# Patient Record
Sex: Female | Born: 1945 | Race: White | Hispanic: No | Smoking: Former smoker
Health system: Southern US, Community
[De-identification: ages and names within clinical notes are randomized; demographics above are authoritative.]

## PROBLEM LIST (undated history)

## (undated) DIAGNOSIS — M419 Scoliosis, unspecified: Secondary | ICD-10-CM

## (undated) DIAGNOSIS — I10 Essential (primary) hypertension: Secondary | ICD-10-CM

## (undated) DIAGNOSIS — K219 Gastro-esophageal reflux disease without esophagitis: Secondary | ICD-10-CM

## (undated) DIAGNOSIS — I471 Supraventricular tachycardia, unspecified: Secondary | ICD-10-CM

## (undated) DIAGNOSIS — C50919 Malignant neoplasm of unspecified site of unspecified female breast: Secondary | ICD-10-CM

## (undated) DIAGNOSIS — Z9889 Other specified postprocedural states: Secondary | ICD-10-CM

## (undated) DIAGNOSIS — T8859XA Other complications of anesthesia, initial encounter: Secondary | ICD-10-CM

## (undated) DIAGNOSIS — M8580 Other specified disorders of bone density and structure, unspecified site: Secondary | ICD-10-CM

## (undated) DIAGNOSIS — C541 Malignant neoplasm of endometrium: Secondary | ICD-10-CM

## (undated) DIAGNOSIS — G4730 Sleep apnea, unspecified: Secondary | ICD-10-CM

## (undated) DIAGNOSIS — E785 Hyperlipidemia, unspecified: Secondary | ICD-10-CM

## (undated) DIAGNOSIS — C439 Malignant melanoma of skin, unspecified: Secondary | ICD-10-CM

## (undated) DIAGNOSIS — T4145XA Adverse effect of unspecified anesthetic, initial encounter: Secondary | ICD-10-CM

## (undated) DIAGNOSIS — R112 Nausea with vomiting, unspecified: Secondary | ICD-10-CM

## (undated) DIAGNOSIS — C4491 Basal cell carcinoma of skin, unspecified: Secondary | ICD-10-CM

## (undated) HISTORY — DX: Malignant neoplasm of unspecified site of unspecified female breast: C50.919

## (undated) HISTORY — DX: Essential (primary) hypertension: I10

## (undated) HISTORY — DX: Malignant melanoma of skin, unspecified: C43.9

## (undated) HISTORY — DX: Gastro-esophageal reflux disease without esophagitis: K21.9

## (undated) HISTORY — PX: BASAL CELL CARCINOMA EXCISION: SHX1214

## (undated) HISTORY — DX: Supraventricular tachycardia: I47.1

## (undated) HISTORY — DX: Basal cell carcinoma of skin, unspecified: C44.91

## (undated) HISTORY — DX: Supraventricular tachycardia, unspecified: I47.10

## (undated) HISTORY — DX: Scoliosis, unspecified: M41.9

## (undated) HISTORY — DX: Hyperlipidemia, unspecified: E78.5

## (undated) HISTORY — DX: Other specified disorders of bone density and structure, unspecified site: M85.80

## (undated) HISTORY — PX: OTHER SURGICAL HISTORY: SHX169

## (undated) HISTORY — PX: SKIN CANCER EXCISION: SHX779

## (undated) HISTORY — PX: VULVA /PERINEUM BIOPSY: SHX319

## (undated) HISTORY — PX: SHOULDER SURGERY: SHX246

## (undated) HISTORY — PX: NOSE SURGERY: SHX723

---
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
## 1976-05-14 HISTORY — PX: TUBAL LIGATION: SHX77

## 1997-06-30 ENCOUNTER — Other Ambulatory Visit: Admission: RE | Admit: 1997-06-30 | Discharge: 1997-06-30 | Payer: Self-pay | Admitting: Obstetrics and Gynecology

## 1997-08-19 ENCOUNTER — Other Ambulatory Visit: Admission: RE | Admit: 1997-08-19 | Discharge: 1997-08-19 | Payer: Self-pay | Admitting: Obstetrics and Gynecology

## 1998-08-26 ENCOUNTER — Other Ambulatory Visit: Admission: RE | Admit: 1998-08-26 | Discharge: 1998-08-26 | Payer: Self-pay | Admitting: Obstetrics and Gynecology

## 1998-09-23 ENCOUNTER — Other Ambulatory Visit: Admission: RE | Admit: 1998-09-23 | Discharge: 1998-09-23 | Payer: Self-pay | Admitting: Obstetrics and Gynecology

## 1999-09-15 ENCOUNTER — Other Ambulatory Visit: Admission: RE | Admit: 1999-09-15 | Discharge: 1999-09-15 | Payer: Self-pay | Admitting: Obstetrics and Gynecology

## 2000-09-27 ENCOUNTER — Other Ambulatory Visit: Admission: RE | Admit: 2000-09-27 | Discharge: 2000-09-27 | Payer: Self-pay | Admitting: Obstetrics and Gynecology

## 2001-05-14 DIAGNOSIS — C50919 Malignant neoplasm of unspecified site of unspecified female breast: Secondary | ICD-10-CM

## 2001-05-14 HISTORY — DX: Malignant neoplasm of unspecified site of unspecified female breast: C50.919

## 2001-07-12 HISTORY — PX: BREAST LUMPECTOMY: SHX2

## 2001-07-31 ENCOUNTER — Other Ambulatory Visit: Admission: RE | Admit: 2001-07-31 | Discharge: 2001-07-31 | Payer: Self-pay | Admitting: Surgery

## 2001-08-05 ENCOUNTER — Ambulatory Visit: Admission: RE | Admit: 2001-08-05 | Discharge: 2001-11-03 | Payer: Self-pay | Admitting: Radiation Oncology

## 2001-08-05 ENCOUNTER — Encounter: Payer: Self-pay | Admitting: Surgery

## 2001-08-05 ENCOUNTER — Encounter: Admission: RE | Admit: 2001-08-05 | Discharge: 2001-08-05 | Payer: Self-pay | Admitting: Surgery

## 2001-08-08 ENCOUNTER — Ambulatory Visit (HOSPITAL_COMMUNITY): Admission: RE | Admit: 2001-08-08 | Discharge: 2001-08-08 | Payer: Self-pay | Admitting: Surgery

## 2001-08-08 ENCOUNTER — Encounter (INDEPENDENT_AMBULATORY_CARE_PROVIDER_SITE_OTHER): Payer: Self-pay | Admitting: *Deleted

## 2001-09-30 ENCOUNTER — Other Ambulatory Visit: Admission: RE | Admit: 2001-09-30 | Discharge: 2001-09-30 | Payer: Self-pay | Admitting: Obstetrics and Gynecology

## 2002-02-18 ENCOUNTER — Ambulatory Visit: Admission: RE | Admit: 2002-02-18 | Discharge: 2002-05-19 | Payer: Self-pay | Admitting: Radiation Oncology

## 2002-10-16 ENCOUNTER — Other Ambulatory Visit: Admission: RE | Admit: 2002-10-16 | Discharge: 2002-10-16 | Payer: Self-pay | Admitting: Obstetrics and Gynecology

## 2003-01-22 ENCOUNTER — Ambulatory Visit (HOSPITAL_COMMUNITY): Admission: RE | Admit: 2003-01-22 | Discharge: 2003-01-22 | Payer: Self-pay | Admitting: Gastroenterology

## 2003-05-15 HISTORY — PX: BREAST RECONSTRUCTION: SHX9

## 2003-10-18 ENCOUNTER — Other Ambulatory Visit: Admission: RE | Admit: 2003-10-18 | Discharge: 2003-10-18 | Payer: Self-pay | Admitting: Obstetrics and Gynecology

## 2004-08-01 ENCOUNTER — Ambulatory Visit: Payer: Self-pay | Admitting: Oncology

## 2004-11-03 ENCOUNTER — Other Ambulatory Visit: Admission: RE | Admit: 2004-11-03 | Discharge: 2004-11-03 | Payer: Self-pay | Admitting: Obstetrics and Gynecology

## 2005-01-29 ENCOUNTER — Ambulatory Visit: Payer: Self-pay | Admitting: Oncology

## 2005-07-30 ENCOUNTER — Ambulatory Visit: Payer: Self-pay | Admitting: Oncology

## 2005-11-09 ENCOUNTER — Other Ambulatory Visit: Admission: RE | Admit: 2005-11-09 | Discharge: 2005-11-09 | Payer: Self-pay | Admitting: Obstetrics and Gynecology

## 2005-12-12 HISTORY — PX: TRANSTHORACIC ECHOCARDIOGRAM: SHX275

## 2006-01-12 HISTORY — PX: NM MYOCAR PERF WALL MOTION: HXRAD629

## 2006-01-18 ENCOUNTER — Ambulatory Visit: Payer: Self-pay | Admitting: Oncology

## 2006-01-22 LAB — COMPREHENSIVE METABOLIC PANEL
ALT: 18 U/L (ref 0–40)
AST: 19 U/L (ref 0–37)
Albumin: 4.4 g/dL (ref 3.5–5.2)
Alkaline Phosphatase: 93 U/L (ref 39–117)
BUN: 21 mg/dL (ref 6–23)
CO2: 28 mEq/L (ref 19–32)
Calcium: 9.4 mg/dL (ref 8.4–10.5)
Chloride: 104 mEq/L (ref 96–112)
Creatinine, Ser: 0.79 mg/dL (ref 0.40–1.20)
Glucose, Bld: 85 mg/dL (ref 70–99)
Potassium: 4.4 mEq/L (ref 3.5–5.3)
Sodium: 140 mEq/L (ref 135–145)
Total Bilirubin: 0.4 mg/dL (ref 0.3–1.2)
Total Protein: 6.8 g/dL (ref 6.0–8.3)

## 2006-01-22 LAB — CBC WITH DIFFERENTIAL/PLATELET
BASO%: 0.4 % (ref 0.0–2.0)
Basophils Absolute: 0 10*3/uL (ref 0.0–0.1)
EOS%: 2.7 % (ref 0.0–7.0)
Eosinophils Absolute: 0.2 10*3/uL (ref 0.0–0.5)
HCT: 38 % (ref 34.8–46.6)
HGB: 12.9 g/dL (ref 11.6–15.9)
LYMPH%: 17.8 % (ref 14.0–48.0)
MCH: 30 pg (ref 26.0–34.0)
MCHC: 33.9 g/dL (ref 32.0–36.0)
MCV: 88.6 fL (ref 81.0–101.0)
MONO#: 0.6 10*3/uL (ref 0.1–0.9)
MONO%: 8.4 % (ref 0.0–13.0)
NEUT#: 4.8 10*3/uL (ref 1.5–6.5)
NEUT%: 70.7 % (ref 39.6–76.8)
Platelets: 239 10*3/uL (ref 145–400)
RBC: 4.29 10*6/uL (ref 3.70–5.32)
RDW: 13.2 % (ref 11.3–14.5)
WBC: 6.9 10*3/uL (ref 3.9–10.0)
lymph#: 1.2 10*3/uL (ref 0.9–3.3)

## 2006-08-12 ENCOUNTER — Ambulatory Visit: Payer: Self-pay | Admitting: Oncology

## 2006-08-14 LAB — CBC WITH DIFFERENTIAL/PLATELET
BASO%: 1.5 % (ref 0.0–2.0)
Basophils Absolute: 0.1 10*3/uL (ref 0.0–0.1)
EOS%: 2.5 % (ref 0.0–7.0)
Eosinophils Absolute: 0.2 10*3/uL (ref 0.0–0.5)
HCT: 37.5 % (ref 34.8–46.6)
HGB: 13.1 g/dL (ref 11.6–15.9)
LYMPH%: 18.3 % (ref 14.0–48.0)
MCH: 31 pg (ref 26.0–34.0)
MCHC: 35 g/dL (ref 32.0–36.0)
MCV: 88.7 fL (ref 81.0–101.0)
MONO#: 0.6 10*3/uL (ref 0.1–0.9)
MONO%: 7.2 % (ref 0.0–13.0)
NEUT#: 5.4 10*3/uL (ref 1.5–6.5)
NEUT%: 70.4 % (ref 39.6–76.8)
Platelets: 226 10*3/uL (ref 145–400)
RBC: 4.23 10*6/uL (ref 3.70–5.32)
RDW: 11.9 % (ref 11.3–14.5)
WBC: 7.7 10*3/uL (ref 3.9–10.0)
lymph#: 1.4 10*3/uL (ref 0.9–3.3)

## 2006-08-14 LAB — COMPREHENSIVE METABOLIC PANEL
ALT: 16 U/L (ref 0–35)
AST: 20 U/L (ref 0–37)
Albumin: 4.5 g/dL (ref 3.5–5.2)
Alkaline Phosphatase: 81 U/L (ref 39–117)
BUN: 18 mg/dL (ref 6–23)
CO2: 26 mEq/L (ref 19–32)
Calcium: 9.4 mg/dL (ref 8.4–10.5)
Chloride: 105 mEq/L (ref 96–112)
Creatinine, Ser: 0.73 mg/dL (ref 0.40–1.20)
Glucose, Bld: 89 mg/dL (ref 70–99)
Potassium: 4.2 mEq/L (ref 3.5–5.3)
Sodium: 140 mEq/L (ref 135–145)
Total Bilirubin: 0.5 mg/dL (ref 0.3–1.2)
Total Protein: 6.8 g/dL (ref 6.0–8.3)

## 2006-11-22 ENCOUNTER — Other Ambulatory Visit: Admission: RE | Admit: 2006-11-22 | Discharge: 2006-11-22 | Payer: Self-pay | Admitting: Obstetrics and Gynecology

## 2007-02-14 ENCOUNTER — Ambulatory Visit: Payer: Self-pay | Admitting: Oncology

## 2007-02-18 LAB — COMPREHENSIVE METABOLIC PANEL
ALT: 28 U/L (ref 0–35)
AST: 25 U/L (ref 0–37)
Albumin: 4.3 g/dL (ref 3.5–5.2)
Alkaline Phosphatase: 77 U/L (ref 39–117)
BUN: 19 mg/dL (ref 6–23)
CO2: 27 mEq/L (ref 19–32)
Calcium: 9.7 mg/dL (ref 8.4–10.5)
Chloride: 104 mEq/L (ref 96–112)
Creatinine, Ser: 0.85 mg/dL (ref 0.40–1.20)
Glucose, Bld: 75 mg/dL (ref 70–99)
Potassium: 4.4 mEq/L (ref 3.5–5.3)
Sodium: 140 mEq/L (ref 135–145)
Total Bilirubin: 0.6 mg/dL (ref 0.3–1.2)
Total Protein: 6.5 g/dL (ref 6.0–8.3)

## 2007-02-18 LAB — CBC WITH DIFFERENTIAL/PLATELET
BASO%: 0.4 % (ref 0.0–2.0)
Basophils Absolute: 0 10*3/uL (ref 0.0–0.1)
EOS%: 2.7 % (ref 0.0–7.0)
Eosinophils Absolute: 0.2 10*3/uL (ref 0.0–0.5)
HCT: 38 % (ref 34.8–46.6)
HGB: 13.3 g/dL (ref 11.6–15.9)
LYMPH%: 17.4 % (ref 14.0–48.0)
MCH: 30.8 pg (ref 26.0–34.0)
MCHC: 34.8 g/dL (ref 32.0–36.0)
MCV: 88.4 fL (ref 81.0–101.0)
MONO#: 0.6 10*3/uL (ref 0.1–0.9)
MONO%: 10.2 % (ref 0.0–13.0)
NEUT#: 4.2 10*3/uL (ref 1.5–6.5)
NEUT%: 69.3 % (ref 39.6–76.8)
Platelets: 214 10*3/uL (ref 145–400)
RBC: 4.3 10*6/uL (ref 3.70–5.32)
RDW: 13.1 % (ref 11.3–14.5)
WBC: 6 10*3/uL (ref 3.9–10.0)
lymph#: 1 10*3/uL (ref 0.9–3.3)

## 2007-08-20 ENCOUNTER — Ambulatory Visit: Payer: Self-pay | Admitting: Oncology

## 2007-08-22 LAB — CBC WITH DIFFERENTIAL/PLATELET
BASO%: 0.9 % (ref 0.0–2.0)
Basophils Absolute: 0.1 10*3/uL (ref 0.0–0.1)
EOS%: 3 % (ref 0.0–7.0)
Eosinophils Absolute: 0.3 10*3/uL (ref 0.0–0.5)
HCT: 37.5 % (ref 34.8–46.6)
HGB: 12.9 g/dL (ref 11.6–15.9)
LYMPH%: 16 % (ref 14.0–48.0)
MCH: 30.5 pg (ref 26.0–34.0)
MCHC: 34.5 g/dL (ref 32.0–36.0)
MCV: 88.4 fL (ref 81.0–101.0)
MONO#: 0.8 10*3/uL (ref 0.1–0.9)
MONO%: 8.9 % (ref 0.0–13.0)
NEUT#: 6.4 10*3/uL (ref 1.5–6.5)
NEUT%: 71.2 % (ref 39.6–76.8)
Platelets: 240 10*3/uL (ref 145–400)
RBC: 4.24 10*6/uL (ref 3.70–5.32)
RDW: 13.1 % (ref 11.3–14.5)
WBC: 8.9 10*3/uL (ref 3.9–10.0)
lymph#: 1.4 10*3/uL (ref 0.9–3.3)

## 2007-08-22 LAB — COMPREHENSIVE METABOLIC PANEL
ALT: 20 U/L (ref 0–35)
AST: 22 U/L (ref 0–37)
Albumin: 4.4 g/dL (ref 3.5–5.2)
Alkaline Phosphatase: 112 U/L (ref 39–117)
BUN: 20 mg/dL (ref 6–23)
CO2: 25 mEq/L (ref 19–32)
Calcium: 9.3 mg/dL (ref 8.4–10.5)
Chloride: 105 mEq/L (ref 96–112)
Creatinine, Ser: 0.78 mg/dL (ref 0.40–1.20)
Glucose, Bld: 92 mg/dL (ref 70–99)
Potassium: 4 mEq/L (ref 3.5–5.3)
Sodium: 141 mEq/L (ref 135–145)
Total Bilirubin: 0.4 mg/dL (ref 0.3–1.2)
Total Protein: 7.1 g/dL (ref 6.0–8.3)

## 2007-12-05 ENCOUNTER — Other Ambulatory Visit: Admission: RE | Admit: 2007-12-05 | Discharge: 2007-12-05 | Payer: Self-pay | Admitting: Obstetrics and Gynecology

## 2008-02-18 ENCOUNTER — Ambulatory Visit: Payer: Self-pay | Admitting: Oncology

## 2008-02-20 LAB — CBC WITH DIFFERENTIAL/PLATELET
BASO%: 1.3 % (ref 0.0–2.0)
Basophils Absolute: 0.1 10*3/uL (ref 0.0–0.1)
EOS%: 3.4 % (ref 0.0–7.0)
Eosinophils Absolute: 0.3 10*3/uL (ref 0.0–0.5)
HCT: 37.2 % (ref 34.8–46.6)
HGB: 12.8 g/dL (ref 11.6–15.9)
LYMPH%: 15.9 % (ref 14.0–48.0)
MCH: 30.4 pg (ref 26.0–34.0)
MCHC: 34.5 g/dL (ref 32.0–36.0)
MCV: 88.1 fL (ref 81.0–101.0)
MONO#: 0.7 10*3/uL (ref 0.1–0.9)
MONO%: 9.9 % (ref 0.0–13.0)
NEUT#: 5.2 10*3/uL (ref 1.5–6.5)
NEUT%: 69.6 % (ref 39.6–76.8)
Platelets: 221 10*3/uL (ref 145–400)
RBC: 4.22 10*6/uL (ref 3.70–5.32)
RDW: 11.8 % (ref 11.3–14.5)
WBC: 7.5 10*3/uL (ref 3.9–10.0)
lymph#: 1.2 10*3/uL (ref 0.9–3.3)

## 2008-06-01 ENCOUNTER — Ambulatory Visit (HOSPITAL_COMMUNITY): Admission: RE | Admit: 2008-06-01 | Discharge: 2008-06-01 | Payer: Self-pay | Admitting: Oncology

## 2008-06-01 ENCOUNTER — Ambulatory Visit: Payer: Self-pay | Admitting: Oncology

## 2008-11-19 ENCOUNTER — Ambulatory Visit: Payer: Self-pay | Admitting: Oncology

## 2008-11-23 LAB — COMPREHENSIVE METABOLIC PANEL
ALT: 22 U/L (ref 0–35)
AST: 25 U/L (ref 0–37)
Albumin: 4.3 g/dL (ref 3.5–5.2)
Alkaline Phosphatase: 82 U/L (ref 39–117)
BUN: 17 mg/dL (ref 6–23)
CO2: 20 mEq/L (ref 19–32)
Calcium: 9.4 mg/dL (ref 8.4–10.5)
Chloride: 105 mEq/L (ref 96–112)
Creatinine, Ser: 0.85 mg/dL (ref 0.40–1.20)
Glucose, Bld: 94 mg/dL (ref 70–99)
Potassium: 4 mEq/L (ref 3.5–5.3)
Sodium: 142 mEq/L (ref 135–145)
Total Bilirubin: 0.6 mg/dL (ref 0.3–1.2)
Total Protein: 6.6 g/dL (ref 6.0–8.3)

## 2008-11-23 LAB — CBC WITH DIFFERENTIAL/PLATELET
BASO%: 0.4 % (ref 0.0–2.0)
Basophils Absolute: 0 10*3/uL (ref 0.0–0.1)
EOS%: 2 % (ref 0.0–7.0)
Eosinophils Absolute: 0.1 10*3/uL (ref 0.0–0.5)
HCT: 38.7 % (ref 34.8–46.6)
HGB: 13.2 g/dL (ref 11.6–15.9)
LYMPH%: 13.2 % — ABNORMAL LOW (ref 14.0–49.7)
MCH: 30.9 pg (ref 25.1–34.0)
MCHC: 34.2 g/dL (ref 31.5–36.0)
MCV: 90.2 fL (ref 79.5–101.0)
MONO#: 0.5 10*3/uL (ref 0.1–0.9)
MONO%: 7.3 % (ref 0.0–14.0)
NEUT#: 5.4 10*3/uL (ref 1.5–6.5)
NEUT%: 77.1 % — ABNORMAL HIGH (ref 38.4–76.8)
Platelets: 198 10*3/uL (ref 145–400)
RBC: 4.29 10*6/uL (ref 3.70–5.45)
RDW: 12.8 % (ref 11.2–14.5)
WBC: 7.1 10*3/uL (ref 3.9–10.3)
lymph#: 0.9 10*3/uL (ref 0.9–3.3)

## 2008-12-07 ENCOUNTER — Encounter: Payer: Self-pay | Admitting: Obstetrics and Gynecology

## 2008-12-07 ENCOUNTER — Other Ambulatory Visit: Admission: RE | Admit: 2008-12-07 | Discharge: 2008-12-07 | Payer: Self-pay | Admitting: Obstetrics and Gynecology

## 2008-12-07 ENCOUNTER — Ambulatory Visit: Payer: Self-pay | Admitting: Obstetrics and Gynecology

## 2009-02-14 ENCOUNTER — Ambulatory Visit: Payer: Self-pay | Admitting: Obstetrics and Gynecology

## 2009-05-20 ENCOUNTER — Ambulatory Visit: Payer: Self-pay | Admitting: Oncology

## 2009-05-24 LAB — CBC WITH DIFFERENTIAL/PLATELET
BASO%: 0.4 % (ref 0.0–2.0)
Basophils Absolute: 0 10*3/uL (ref 0.0–0.1)
EOS%: 2.5 % (ref 0.0–7.0)
Eosinophils Absolute: 0.2 10*3/uL (ref 0.0–0.5)
HCT: 39.6 % (ref 34.8–46.6)
HGB: 13.4 g/dL (ref 11.6–15.9)
LYMPH%: 16.1 % (ref 14.0–49.7)
MCH: 31.3 pg (ref 25.1–34.0)
MCHC: 33.8 g/dL (ref 31.5–36.0)
MCV: 92.6 fL (ref 79.5–101.0)
MONO#: 0.7 10*3/uL (ref 0.1–0.9)
MONO%: 10.2 % (ref 0.0–14.0)
NEUT#: 4.7 10*3/uL (ref 1.5–6.5)
NEUT%: 70.8 % (ref 38.4–76.8)
Platelets: 195 10*3/uL (ref 145–400)
RBC: 4.27 10*6/uL (ref 3.70–5.45)
RDW: 13.3 % (ref 11.2–14.5)
WBC: 6.6 10*3/uL (ref 3.9–10.3)
lymph#: 1.1 10*3/uL (ref 0.9–3.3)

## 2009-09-06 ENCOUNTER — Encounter: Admission: RE | Admit: 2009-09-06 | Discharge: 2009-10-20 | Payer: Self-pay | Admitting: Orthopedic Surgery

## 2009-11-11 LAB — CONVERTED CEMR LAB: Pap Smear: NORMAL

## 2009-11-24 ENCOUNTER — Encounter: Payer: Self-pay | Admitting: Internal Medicine

## 2009-12-13 ENCOUNTER — Other Ambulatory Visit: Admission: RE | Admit: 2009-12-13 | Discharge: 2009-12-13 | Payer: Self-pay | Admitting: Obstetrics and Gynecology

## 2009-12-13 ENCOUNTER — Ambulatory Visit: Payer: Self-pay | Admitting: Obstetrics and Gynecology

## 2009-12-23 ENCOUNTER — Ambulatory Visit: Payer: Self-pay | Admitting: Obstetrics and Gynecology

## 2010-03-14 ENCOUNTER — Ambulatory Visit: Payer: Self-pay | Admitting: Internal Medicine

## 2010-03-14 DIAGNOSIS — M949 Disorder of cartilage, unspecified: Secondary | ICD-10-CM

## 2010-03-14 DIAGNOSIS — K219 Gastro-esophageal reflux disease without esophagitis: Secondary | ICD-10-CM

## 2010-03-14 DIAGNOSIS — M899 Disorder of bone, unspecified: Secondary | ICD-10-CM

## 2010-03-14 DIAGNOSIS — E785 Hyperlipidemia, unspecified: Secondary | ICD-10-CM | POA: Insufficient documentation

## 2010-03-14 DIAGNOSIS — I10 Essential (primary) hypertension: Secondary | ICD-10-CM

## 2010-03-14 DIAGNOSIS — M8580 Other specified disorders of bone density and structure, unspecified site: Secondary | ICD-10-CM | POA: Insufficient documentation

## 2010-03-14 HISTORY — DX: Hyperlipidemia, unspecified: E78.5

## 2010-03-14 HISTORY — DX: Disorder of bone, unspecified: M89.9

## 2010-03-14 HISTORY — DX: Essential (primary) hypertension: I10

## 2010-03-14 HISTORY — DX: Gastro-esophageal reflux disease without esophagitis: K21.9

## 2010-04-25 ENCOUNTER — Encounter: Payer: Self-pay | Admitting: Internal Medicine

## 2010-05-14 DIAGNOSIS — C439 Malignant melanoma of skin, unspecified: Secondary | ICD-10-CM

## 2010-05-14 HISTORY — DX: Malignant melanoma of skin, unspecified: C43.9

## 2010-05-22 ENCOUNTER — Ambulatory Visit: Payer: Self-pay | Admitting: Oncology

## 2010-05-24 ENCOUNTER — Encounter: Payer: Self-pay | Admitting: Internal Medicine

## 2010-05-24 LAB — CBC WITH DIFFERENTIAL/PLATELET
BASO%: 0.4 % (ref 0.0–2.0)
Basophils Absolute: 0 10*3/uL (ref 0.0–0.1)
EOS%: 2.1 % (ref 0.0–7.0)
Eosinophils Absolute: 0.1 10*3/uL (ref 0.0–0.5)
HCT: 39.8 % (ref 34.8–46.6)
HGB: 13.5 g/dL (ref 11.6–15.9)
LYMPH%: 14.2 % (ref 14.0–49.7)
MCH: 30.8 pg (ref 25.1–34.0)
MCHC: 33.9 g/dL (ref 31.5–36.0)
MCV: 90.9 fL (ref 79.5–101.0)
MONO#: 0.6 10*3/uL (ref 0.1–0.9)
MONO%: 9 % (ref 0.0–14.0)
NEUT#: 5.3 10*3/uL (ref 1.5–6.5)
NEUT%: 74.3 % (ref 38.4–76.8)
Platelets: 215 10*3/uL (ref 145–400)
RBC: 4.37 10*6/uL (ref 3.70–5.45)
RDW: 13.5 % (ref 11.2–14.5)
WBC: 7.1 10*3/uL (ref 3.9–10.3)
lymph#: 1 10*3/uL (ref 0.9–3.3)

## 2010-05-24 LAB — LIPID PANEL
Cholesterol: 176 mg/dL (ref 0–200)
HDL: 75 mg/dL (ref >39)
LDL Cholesterol: 85 mg/dL (ref 0–99)
Total CHOL/HDL Ratio: 2.3 Ratio
Triglycerides: 78 mg/dL (ref <150)
VLDL: 16 mg/dL (ref 0–40)

## 2010-05-24 LAB — COMPREHENSIVE METABOLIC PANEL
ALT: 18 U/L (ref 0–35)
AST: 25 U/L (ref 0–37)
Albumin: 4.5 g/dL (ref 3.5–5.2)
Alkaline Phosphatase: 86 U/L (ref 39–117)
BUN: 18 mg/dL (ref 6–23)
CO2: 29 mEq/L (ref 19–32)
Calcium: 10.1 mg/dL (ref 8.4–10.5)
Chloride: 102 mEq/L (ref 96–112)
Creatinine, Ser: 0.94 mg/dL (ref 0.40–1.20)
Glucose, Bld: 97 mg/dL (ref 70–99)
Potassium: 4.2 mEq/L (ref 3.5–5.3)
Sodium: 141 mEq/L (ref 135–145)
Total Bilirubin: 0.5 mg/dL (ref 0.3–1.2)
Total Protein: 6.9 g/dL (ref 6.0–8.3)

## 2010-06-07 ENCOUNTER — Telehealth (INDEPENDENT_AMBULATORY_CARE_PROVIDER_SITE_OTHER): Payer: Self-pay | Admitting: *Deleted

## 2010-06-13 NOTE — Assessment & Plan Note (Signed)
Summary: new to estav/cbs   Vital Signs:  Patient profile:   65 year old female Height:      60 inches Weight:      140 pounds BMI:     27.44 Pulse rate:   69 / minute Pulse rhythm:   regular BP sitting:   142 / 82  (left arm) Cuff size:   regular  Vitals Entered By: Army Fossa CMA (March 14, 2010 2:36 PM) CC: New to Establish Comments Discuss Shingles Vaccine Will check records for TD Due for Bone Density Not sure when colonoscopy was done   History of Present Illness: new patient   hypertension-- good medication compliance hyperlipidemia--  issue is followup by cardiology, they are about to change from simvastatin to Pravachol  As far as her primary care, she had a Cscope < 10 years ago w/  Dr Loreta Ave, sees gyn  DEXA--  osteopenis R hip, last dexa  ~ 1 year ago ( used [place])  she brought several labs from 11/2009, they are reviewed -- normal CBC BMP LFTs  Preventive Screening-Counseling & Management  Alcohol-Tobacco     Alcohol drinks/day: 4     Alcohol type: wine/beer     Smoking Status: never  Caffeine-Diet-Exercise     Does Patient Exercise: yes     Times/week: 5      Drug Use:  no.    Current Medications (verified): 1)  Diltiazem Hcl Cr 240 Mg Xr24h-Cap (Diltiazem Hcl) .... Once Daily 2)  Simvastatin 40 Mg Tabs (Simvastatin) .... Qd 3)  Protonix 40 Mg Solr (Pantoprazole Sodium) .... Qd 4)  B12 .... 2 Two Times A Day 5)  Vitamin D3 1000 Unit Caps (Cholecalciferol) .... 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)  Risk Factors: Alcohol Use: 4 (03/14/2010) Exercise: yes  (03/14/2010)  Risk Factors: Smoking Status: never (03/14/2010)  Past Medical History: Breast Cancer (2003), sees oncology once a year, used femara before  SVT, 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 , no caht  Skin Cancer( Basal Cell) , sees derm  Hyperlipidemia Hypertension see sgyn Dr Rulon Sera   Past Surgical History: Lumpectomy (L) Breast  Family History: Diabetes-- MGM, B (borderline) Hypertension-- M  stroke --F age 72 MI--no colon ca-- maternal aunt  breast ca-- no  Social History: widow 1 adopted and 1 natural child Never Smoked Alcohol use-yes Drug use-no Regular exercise-yes occupation-- retired, 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 Currently asymptomatic. CV:  ambulatory BPs within normal. GI:  GERD symptoms well controlled. MS:  on statins, denies myalgias.  Physical Exam  General:  alert, well-developed, and well-nourished.   Neck:  no masses, no thyromegaly, and normal carotid upstroke.   Lungs:  normal respiratory effort, no intercostal retractions, no accessory muscle use, and normal breath sounds.   Heart:  normal rate, regular rhythm, no murmur, and no gallop.   Abdomen:  soft, non-tender, no distention, no masses, no guarding, and no rigidity.   Extremities:  no lower  extremity edema Psych:  Cognition and judgment appear intact. Alert and cooperative with normal attention span and concentration. not anxious appearing and not depressed appearing.     Impression & Recommendations:  Problem # 1:  HYPERTENSION (ICD-401.9) Well-controlled BMP normal 7- 2011 Her updated medication list for this problem includes:    Diltiazem Hcl Cr 240 Mg Xr24h-cap (Diltiazem hcl) ..... Once daily  Problem # 2:  HYPERLIPIDEMIA (ICD-272.4) followup by cardiology LFTs normal  7-11 Cardiology is about to change from simvastatin to Pravachol Her updated medication list for this problem includes:    Simvastatin 40 Mg Tabs (Simvastatin) ..... Qd  Problem # 3:  ROUTINE GENERAL MEDICAL EXAM@HEALTH  CARE FACL (ICD-V70.0) she had a Cscope < 10 years ago w/  Dr Loreta Ave sees gyn  she brought several labs from 11/2009, they are reviewed -- normal CBC BMP LFTs  likes a shingles shot: provided   Problem # 4:  OSTEOPENIA (ICD-733.90) DEXA--  osteopenia  R hip, last dexa  ~ 1 year ago follow up elsewhere Her updated medication list for this problem includes:    Vitamin D3 1000 Unit Caps (Cholecalciferol) ..... Qd  Problem # 5:  GERD (ICD-530.81)  GERD well controlled with Protonix, will call when she needs a prescription  Her updated medication list for this problem includes:    Protonix 40 Mg Solr (Pantoprazole sodium) ..... Qd  Complete Medication List: 1)  Diltiazem Hcl Cr 240 Mg Xr24h-cap (Diltiazem hcl) .... Once daily 2)  Simvastatin 40 Mg Tabs (Simvastatin) .... Qd 3)  Protonix 40 Mg Solr (Pantoprazole sodium) .... Qd 4)  B12  .... 2 two times a day 5)  Vitamin D3 1000 Unit Caps (Cholecalciferol) .... Qd  Other Orders: Zoster (Shingles) Vaccine Live [id number]) Admin 1st Vaccine (98119)  Patient Instructions: 1)  Please schedule a follow-up appointment in 1 year.    Orders Added: 1)  Zoster (Shingles) Vaccine Live [90736] 2)  Admin 1st Vaccine [90471] 3)  New Patient Level III [99203]   Immunization History:  Influenza Immunization History:    Influenza:  historical (02/21/2010)  Pneumovax Immunization History:    Pneumovax:  historical (02/11/2009)  Immunizations Administered:  Zostavax # 1:    Vaccine Type: Zostavax    Site: left deltoid    Mfr: Merck    Dose: 0.65 ml    Route: [place]    Given by: Army Fossa CMA    Exp. Date: 12/30/2010    Lot #: [id number]   Immunization History:  Influenza Immunization History:    Influenza:   Historical (02/21/2010)  Pneumovax Immunization History:    Pneumovax:  Historical (02/11/2009)  Immunizations Administered:  Zostavax # 1:    Vaccine Type: Zostavax    Site: left deltoid    Mfr: Merck    Dose: 0.65 ml    Route: [place]    Given by: Army Fossa CMA    Exp. Date: 12/30/2010    Lot #: [id number]   Preventive Care Screening  Mammogram:    Date:  11/11/2009    Results:  normal- per pt   Pap Smear:    Date:  11/11/2009    Results:  normal-per pt     Risk Factors:  Tobacco use:  never Drug use:  no Alcohol use:  yes    Type:  wine/beer    Drinks per day:  4 Exercise:  yes    Times per week:  5  Mammogram History:     Date of Last Mammogram:  11/11/2009    Results:  normal- per pt   PAP Smear History:     Date of Last PAP Smear:  11/11/2009    Results:  normal-per pt

## 2010-06-15 NOTE — Letter (Signed)
Summary: [facility]  [facility]   Imported By: Lanelle Bal 05/11/2010 12:22:18  _____________________________________________________________________  External Attachment:    Type:   Image     Comment:   External Document

## 2010-06-15 NOTE — Progress Notes (Signed)
Summary: RX--  Phone Note Refill Request Call back at Home Phone [phone] Message from:  Patient  Refills Requested: Medication #1:  PROTONIX 40 MG SOLR qd   Dosage confirmed as above?Dosage Confirmed   Supply Requested: 1 month SET TO [facility] FOR 3 MONTH SUPPLY  Initial call taken by: Freddy Jaksch,  June 07, 2010 1:45 PM    Prescriptions: PROTONIX 40 MG SOLR (PANTOPRAZOLE SODIUM) qd  #90 x 2   Entered by:   Army Fossa CMA   Authorized by:   Nolon Rod. Paz MD   Signed by:   Army Fossa CMA on 06/07/2010   Method used:   Faxed to ...       [facility] (mail-order)             , [place]         Ph: [phone]       Fax: [phone]   RxID:   [id number]

## 2010-07-05 NOTE — Letter (Signed)
Summary: breast ca Dx 2003, stable, 1 year[facility]   [facility]   Imported By: Maryln Gottron 06/23/2010 14:51:41  _____________________________________________________________________  External Attachment:    Type:   Image     Comment:   External Document

## 2010-07-24 ENCOUNTER — Ambulatory Visit: Payer: Medicare Other | Attending: Orthopedic Surgery | Admitting: Physical Therapy

## 2010-07-24 DIAGNOSIS — M2569 Stiffness of other specified joint, not elsewhere classified: Secondary | ICD-10-CM | POA: Insufficient documentation

## 2010-07-24 DIAGNOSIS — M545 Low back pain, unspecified: Secondary | ICD-10-CM | POA: Insufficient documentation

## 2010-07-24 DIAGNOSIS — IMO0001 Reserved for inherently not codable concepts without codable children: Secondary | ICD-10-CM | POA: Insufficient documentation

## 2010-08-07 ENCOUNTER — Ambulatory Visit: Payer: Medicare Other | Admitting: Physical Therapy

## 2010-08-07 ENCOUNTER — Ambulatory Visit: Payer: Medicare Other | Attending: Orthopedic Surgery | Admitting: Physical Therapy

## 2010-08-07 DIAGNOSIS — M2569 Stiffness of other specified joint, not elsewhere classified: Secondary | ICD-10-CM | POA: Insufficient documentation

## 2010-08-07 DIAGNOSIS — IMO0001 Reserved for inherently not codable concepts without codable children: Secondary | ICD-10-CM | POA: Insufficient documentation

## 2010-08-07 DIAGNOSIS — M545 Low back pain, unspecified: Secondary | ICD-10-CM | POA: Insufficient documentation

## 2010-09-29 NOTE — Op Note (Signed)
NAME:  Emily Thompson, Emily Thompson                     ACCOUNT NO.:  [id number]   MEDICAL RECORD NO.:  [id number]                   PATIENT TYPE:  AMB   LOCATION:  ENDO                                 FACILITY:  [facility]   PHYSICIAN:  Anselmo Rod, M.D.               DATE OF BIRTH:  October 17, 1945   DATE OF PROCEDURE:  01/22/2003  DATE OF DISCHARGE:                                 OPERATIVE REPORT   PROCEDURE:  Screening colonoscopy.   ENDOSCOPIST:  Anselmo Rod, M.D.   INSTRUMENT USED:  Olympus video colonoscope.   INDICATIONS FOR PROCEDURE:  Rectal bleeding in a 65 year old white  female  with a personal history of breast cancer in 2003 and a family history of  colon cancer in a maternal aunt, rule out colonic polyps, masses,  hemorrhoids, etc.   PREPROCEDURE PREPARATION:  Informed consent was obtained from the patient  and the patient was  fasted for 8 hours prior to  the procedure and prepped  with a bottle of magnesium citrate and a gallon of GoLYTELY the  night prior  to the procedure.   PREPROCEDURE PHYSICAL:  The patient had stable vital signs. Neck supple.  Chest clear to auscultation, S1, S2 regular. Abdomen soft with normoactive  bowel sounds.   DESCRIPTION OF PROCEDURE:  The patient was placed in the left lateral  decubitus position and sedated with 60 mg of Demerol and 6 mg of Versed  intravenously. Once sedation was adequate, the patient was maintained on low  flow oxygen and continuous cardiac monitoring.   The Olympus video colonoscope was advanced into the rectum to the cecum and  the terminal ileum without difficulty.  The appendiceal orifice and  ileocecal valve were clearly visualized and photographed.  The terminal  ileum appeared  normal. No masses, polyps, erosions, ulcerations or  diverticula were seen.  Retroflexion in the rectum revealed a prominent  internal hemorrhoid. The patient tolerated the procedure well without  complications.   IMPRESSION:   Normal colonoscopy to the terminal ileum except for prominent  internal hemorrhoids.   RECOMMENDATIONS:  1. A high fiber diet with liberal fluid intake has been advocated.  2. Anusol HC 2.5% suppository if rectal bleeding becomes a problem in the     future.  3. Repeat colorectal cancer screening in the next 5 years considering the     patient's family history of colon cancer and personal history of breast     cancer.  4. Outpatient follow up as the need arises in the future.                                                 Anselmo Rod, M.D.    JNM/MEDQ  D:  01/22/2003  T:  01/23/2003  Job:  [id number]  cc:   Talmadge Coventry, M.D.  [address]  [place]  [place] [address]  Fax: [phone]   Lennis P. Darrold Span, M.D.  [address] [facility]  [place]  [place] [address]  Fax: [phone]   Sandria Bales. Ezzard Standing, M.D.  [address]  [place]  [place] [address]  Fax: [phone]

## 2010-10-11 ENCOUNTER — Encounter (INDEPENDENT_AMBULATORY_CARE_PROVIDER_SITE_OTHER): Payer: Self-pay | Admitting: Surgery

## 2010-12-19 ENCOUNTER — Encounter: Payer: Medicare Other | Admitting: Obstetrics and Gynecology

## 2010-12-25 ENCOUNTER — Ambulatory Visit (INDEPENDENT_AMBULATORY_CARE_PROVIDER_SITE_OTHER): Payer: Medicare Other | Admitting: Obstetrics and Gynecology

## 2010-12-25 ENCOUNTER — Encounter: Payer: Self-pay | Admitting: Obstetrics and Gynecology

## 2010-12-25 VITALS — BP 124/70 | Ht 60.0 in | Wt 140.0 lb

## 2010-12-25 DIAGNOSIS — Z780 Asymptomatic menopausal state: Secondary | ICD-10-CM

## 2010-12-25 DIAGNOSIS — Z01419 Encounter for gynecological examination (general) (routine) without abnormal findings: Secondary | ICD-10-CM

## 2010-12-25 DIAGNOSIS — R82998 Other abnormal findings in urine: Secondary | ICD-10-CM

## 2010-12-25 DIAGNOSIS — N952 Postmenopausal atrophic vaginitis: Secondary | ICD-10-CM | POA: Insufficient documentation

## 2010-12-25 DIAGNOSIS — N951 Menopausal and female climacteric states: Secondary | ICD-10-CM

## 2010-12-25 DIAGNOSIS — C50919 Malignant neoplasm of unspecified site of unspecified female breast: Secondary | ICD-10-CM | POA: Insufficient documentation

## 2010-12-25 HISTORY — DX: Postmenopausal atrophic vaginitis: N95.2

## 2010-12-25 HISTORY — DX: Asymptomatic menopausal state: Z78.0

## 2010-12-25 NOTE — Progress Notes (Signed)
Addended byCammie Mcgee T on: 12/25/2010 03:08 PM   Modules accepted: Orders

## 2010-12-25 NOTE — Progress Notes (Signed)
The patient came to see me today for a problem visit. She had been on statins for elevated cholesterol. When she was on them she was having severe hot flashes. She felt it was due to the Femara she was on for her breast cancer. When she stopped the Femara the hot flashes did not get better. She then stopped the statins and the hot flashes went way. She is trying to manage it now with Dr. Caprice Kluver with fish oils so she does not have to take statins. She is up-to-date on her mammograms. She continues to suffer with vaginal dryness. She is using lubricant because Dr. Carlis Stable did not want to take estrogen. We continued also watch her  with a red lesion on her right labia. She had a previous vulvar biopsy with [place] on the left side that revealed just a papilloma. She just had a followup bone density which showed stable osteopenia. Her FRAX risk was not elevated. She is having no vaginal bleeding. She is having no pelvic pain.  Past medical history, family history, and social history reviewed and in chart.  Review of systems reviewed and 9 point only positive as noted above.  Physical examination: HEENT within normal limits. Neck: Thyroid not large. No masses. Supraclavicular nodes: not enlarged. Breasts: Examined in both sitting midline position. No skin changes and no masses. She is status post left lumpectomy with left breast implant. She is status post reduction mammoplasty on the right. There are no masses. Abdomen: Soft no guarding rebound or masses or hernia. Pelvic: External: Within normal limits. BUS: Within normal limits. There is still a very small red lesion on her right labia. Vaginal:within normal limits. Poor estrogen effect. No evidence of cystocele rectocele or enterocele. Cervix: clean. Uterus: Normal size and shape. Adnexa: No masses.   Rectovaginal exam: Confirmatory and negative.  Assessment: 1. Menopausal symptoms from statin. 2. Atrophic vaginitis-symptomatic 3. Breast cancer 4.  Osteopenia 5. Stable labial exam  Plan: Patient to discuss with Dr. Carlis Stable whether she will now let her use Vagifem as she is 9 years post treatment for breast cancer. Continue periodic bone densities. Continue yearly mammograms. Extremities: Within normal limits.

## 2010-12-29 ENCOUNTER — Ambulatory Visit (INDEPENDENT_AMBULATORY_CARE_PROVIDER_SITE_OTHER): Payer: Medicare Other | Admitting: Surgery

## 2010-12-29 ENCOUNTER — Encounter (INDEPENDENT_AMBULATORY_CARE_PROVIDER_SITE_OTHER): Payer: Self-pay | Admitting: Surgery

## 2010-12-29 VITALS — Wt 140.0 lb

## 2010-12-29 DIAGNOSIS — Z853 Personal history of malignant neoplasm of breast: Secondary | ICD-10-CM

## 2010-12-29 HISTORY — DX: Personal history of malignant neoplasm of breast: Z85.3

## 2010-12-29 NOTE — Progress Notes (Signed)
Chief Complaint  Patient presents with  . Other    f/u breast reck     Emily Thompson is a 65 y.o. (DOB: 1946-02-03)  white female who is a patient of Willow Ora, MD, MD and comes to me today for followup for breast cancer.  Path (side, TNM): T1, N0 Left Surgery: Left lumpectomy  Date: 07/31/2001 Size of tumor: 1.8  cm Nodes: 0/1 ER: 72% PR: 93% Ki67: 23  HER2Neu: neg  Medical Oncologist: Darrold Span Radiation Oncologist: [place]  She is doing well.  We talked a little about my follow up method (q 6 mo for 5 yrs, q 1 year till year 7, then talk)    She is having some left chest pain at her costochondral junction.    Past Medical History  Diagnosis Date  . GERD (gastroesophageal reflux disease)   . HX: breast cancer   . Reflux   . Osteopenia   . Cancer     Breast  . Heart palpitations   . Elevated cholesterol   . Hearing aid worn   . Hypertension     Current Outpatient Prescriptions  Medication Sig Dispense Refill  . CALCIUM-VITAMIN D PO Take by mouth.        . Cholecalciferol (VITAMIN D) 1000 UNITS capsule Take 1,000 Units by mouth daily.        Marland Kitchen diltiazem (CARDIZEM CD) 240 MG 24 hr capsule Take 240 mg by mouth daily.        . Omega-3 Fatty Acids (FISH OIL) 1200 MG CAPS Take by mouth 3 (three) times daily.        . pantoprazole (PROTONIX) 40 MG tablet Take 40 mg by mouth daily.        . riboflavin (VITAMIN B-2) 100 MG TABS Take 100 mg by mouth daily.        Marland Kitchen aspirin 81 MG tablet Take 81 mg by mouth daily.        . simvastatin (ZOCOR) 20 MG tablet Take 40 mg by mouth at bedtime.         Allergies  Allergen Reactions  . Dextromethorphan   . Morphine And Related   . Other     PAPER TAPE    PHYSICAL EXAM: Wt 140 lb (63.504 kg)  LMP 05/14/1997  HEENT:  Pupils equal.  Dentition good.  No injury. NECK:  Supple.  No thyroid mass. LYMPH NODES:  No cervical, supraclavicular, or axillary adenopathy. BREASTS -  RIGHT:  Evidence of reduction.  No mass.   LEFT:  No  palpable mass or nodule.  Defect at nipple from biopsy.  Some firmness lower breast.  Pain at costochondral junction on left. UPPER EXTREMITIES:  No evidence of lymphedema.  DATA REVIEWED: Mammogram 11/20/10 - neg.  ASSESSMENT and PLAN: 1.  Left breast cancer.  T1, N0.  Doing well with no recurrence now 9 years out.  To see me back in  1 year.

## 2011-01-03 ENCOUNTER — Encounter (INDEPENDENT_AMBULATORY_CARE_PROVIDER_SITE_OTHER): Payer: Self-pay | Admitting: Oncology

## 2011-01-03 ENCOUNTER — Encounter: Payer: Self-pay | Admitting: Obstetrics and Gynecology

## 2011-01-04 ENCOUNTER — Telehealth: Payer: Self-pay | Admitting: *Deleted

## 2011-01-04 DIAGNOSIS — N952 Postmenopausal atrophic vaginitis: Secondary | ICD-10-CM

## 2011-01-04 MED ORDER — ESTRADIOL 10 MCG VA TABS: 10.0000 ug | ORAL_TABLET | VAGINAL | Status: DC

## 2011-01-04 NOTE — Telephone Encounter (Signed)
Tell patient I called in. If need be she can go to 3 times a week. The normal doses twice a week.

## 2011-01-04 NOTE — Telephone Encounter (Signed)
Patient informed prescription information.

## 2011-01-04 NOTE — Telephone Encounter (Signed)
Patient called to let you know that she checked with her oncologist about the Vagifem, and he said that would be ok to use.  Patient would like this rx sent to [facility]. (would need to set up 90 day supply. Already set [facility] as pharmacy)

## 2011-02-13 ENCOUNTER — Telehealth: Payer: Self-pay | Admitting: Internal Medicine

## 2011-02-13 NOTE — Telephone Encounter (Signed)
LMOM to call back for clarification: does patient just need print out of current medications taken or does she actually need printed Rx.?

## 2011-02-13 NOTE — Telephone Encounter (Signed)
Printed, awaiting signature.  

## 2011-02-13 NOTE — Telephone Encounter (Signed)
Ready for p/u; Pt informed.

## 2011-02-13 NOTE — Telephone Encounter (Signed)
Print a medication list and I'll sign it

## 2011-02-13 NOTE — Telephone Encounter (Signed)
Needing a prescription or documentation of the prescriptions that she is taking [place].

## 2011-02-14 ENCOUNTER — Other Ambulatory Visit: Payer: Self-pay | Admitting: *Deleted

## 2011-02-14 MED ORDER — PANTOPRAZOLE SODIUM 40 MG PO TBEC: 40.0000 mg | DELAYED_RELEASE_TABLET | Freq: Every day | ORAL | Status: DC

## 2011-02-14 NOTE — Progress Notes (Signed)
PER PATIENT REQUEST P/U PRINTED RX FOR OVERSEAS TRIP.

## 2011-02-15 ENCOUNTER — Telehealth: Payer: Self-pay | Admitting: *Deleted

## 2011-02-15 NOTE — Telephone Encounter (Signed)
Patient states that she is in need of a printed Rx for her Protonix, as advised by her travel assistant for her overseas trip. Done. LMOM to inform patient ready for p/u.

## 2011-03-22 ENCOUNTER — Other Ambulatory Visit: Payer: Self-pay | Admitting: Dermatology

## 2011-04-24 ENCOUNTER — Encounter: Payer: Medicare Other | Admitting: Internal Medicine

## 2011-05-04 ENCOUNTER — Encounter: Payer: Self-pay | Admitting: Internal Medicine

## 2011-05-04 ENCOUNTER — Ambulatory Visit (INDEPENDENT_AMBULATORY_CARE_PROVIDER_SITE_OTHER): Payer: Medicare Other | Admitting: Internal Medicine

## 2011-05-04 ENCOUNTER — Other Ambulatory Visit: Payer: Self-pay | Admitting: Internal Medicine

## 2011-05-04 DIAGNOSIS — E785 Hyperlipidemia, unspecified: Secondary | ICD-10-CM

## 2011-05-04 DIAGNOSIS — Z0000 Encounter for general adult medical examination without abnormal findings: Secondary | ICD-10-CM

## 2011-05-04 DIAGNOSIS — Z23 Encounter for immunization: Secondary | ICD-10-CM

## 2011-05-04 DIAGNOSIS — I10 Essential (primary) hypertension: Secondary | ICD-10-CM

## 2011-05-04 DIAGNOSIS — K219 Gastro-esophageal reflux disease without esophagitis: Secondary | ICD-10-CM

## 2011-05-04 DIAGNOSIS — M899 Disorder of bone, unspecified: Secondary | ICD-10-CM

## 2011-05-04 HISTORY — DX: Encounter for general adult medical examination without abnormal findings: Z00.00

## 2011-05-04 MED ORDER — PANTOPRAZOLE SODIUM 40 MG PO TBEC: 40.0000 mg | DELAYED_RELEASE_TABLET | Freq: Every day | ORAL | Status: DC

## 2011-05-04 NOTE — Progress Notes (Signed)
  Subjective:    Patient ID: Emily Thompson, female    DOB: 1945-11-11, 65 y.o.   MRN: [id number]  HPI Here for Medicare AWV: 1. Risk factors based on Past M, S, F history: reviewed 2. Physical Activities: goes t the gym x 5/week   3. Depression/mood:  No problemss noted or reported  4. Hearing:  Has hearing aids 5. ADL's:  Independent, still drives  6. Fall Risk: no recent falls, low risk 7. home Safety: does feel safe at home  8. Height, weight, &visual acuity: see VS, uses glasses , sees eye doctor regularly 9. Counseling: provided 10. Labs ordered based on risk factors: if needed  11. Referral Coordination: if needed 12.  Care Plan, see assessment and plan  13.   Cognitive Assessment: cognition and motor skills wnl  In addition, today we discussed the following: hypertension-- good medication compliance hyperlipidemia--  was switched from simvastatin to Pravachol, couldn't tolerate; on looking back she realizes statins gave her HA-hot flashes. Couldn't tolerate lipitor either.  DEXA--  osteopenia R hip, last dexa ~ 1.5 year ago ( used femara), good comploiance w/ ca and vit d Had cough for 2 weeks, symptoms resolve, they temporarily increase her GERD but things are back to baseline  Past Medical History: Breast Cancer (2003), sees oncology once a year, used femara before  SVT, 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 , no cath Skin Cancer  --Melanoma dx 2012, face s/p  excision -- Basal Cell)   Hyperlipidemia Hypertension see sgyn Dr Rulon Sera   Past Surgical History: Lumpectomy (L) Breast Skin cancer surgery  Family History: Diabetes-- MGM, B (borderline) Hypertension-- M  stroke --F age 21 MI--no colon ca-- maternal aunt in her 40 breast ca-- no  Social History: Widow, 1 adopted and 1 natural child Never Smoked Alcohol use-yes Drug use-no Regular exercise-yes occupation-- retired, 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 No blood in the stools.    Objective:   Physical Exam  Constitutional: She is oriented to person, place, and time. She appears well-developed and well-nourished. No distress.  HENT:  Head: Normocephalic and atraumatic.  Neck: No thyromegaly present.       Normal carotid pulse  Cardiovascular: Normal rate, regular rhythm and normal heart sounds.   No murmur heard. Pulmonary/Chest: Effort normal and breath sounds normal. No respiratory distress. She has no wheezes. She has no rales.  Abdominal: Soft. Bowel sounds are normal. She exhibits no distension. There is no tenderness. There is no rebound and no guarding.  Musculoskeletal: She exhibits no edema.  Neurological: She is alert and oriented to person, place, and time.  Skin: She is not diaphoretic.  Psychiatric: She has a normal mood and affect. Her behavior is normal. Thought content normal.       Assessment & Plan:

## 2011-05-04 NOTE — Assessment & Plan Note (Signed)
Cardiology switched from simvastatin to Pravachol, couldn't tolerate; on looking back she realizes statins gave her HA-hot flashes w/ simva. Couldn't tolerate lipitor either. Plan: Labs  Cont healthy life style, OTCs

## 2011-05-04 NOTE — Patient Instructions (Addendum)
Please came back fasting: FLP AST ALT---dx high cholesterol BMP, CBC,  TSH -- dx HTN Check the  blood pressure 2 or 3 times a week, be sure it is less than 140/85. If it is consistently higher, let me know

## 2011-05-04 NOTE — Assessment & Plan Note (Signed)
DEXA per gyn

## 2011-05-04 NOTE — Assessment & Plan Note (Signed)
No change, see instructions 

## 2011-05-04 NOTE — Assessment & Plan Note (Addendum)
Td ~ 5 to 10 years ago Pneumonia shot -- at age 65 Flu shot -- today Shingles shot-- completed   cscope-- had one ~ 10 years ago, Dr Loreta Ave ---> referral done Female care per gyn Doing great w/ diet-exercise

## 2011-05-04 NOTE — Assessment & Plan Note (Signed)
Doing well, sx controlled

## 2011-05-05 ENCOUNTER — Telehealth: Payer: Self-pay | Admitting: Oncology

## 2011-05-05 NOTE — Telephone Encounter (Signed)
Talked pt gave pt appt for 05/17/11 , r/s fr 05/25/11 due to Epic

## 2011-05-07 ENCOUNTER — Other Ambulatory Visit (INDEPENDENT_AMBULATORY_CARE_PROVIDER_SITE_OTHER): Payer: Medicare Other

## 2011-05-07 DIAGNOSIS — E785 Hyperlipidemia, unspecified: Secondary | ICD-10-CM

## 2011-05-07 DIAGNOSIS — I10 Essential (primary) hypertension: Secondary | ICD-10-CM

## 2011-05-07 LAB — BASIC METABOLIC PANEL
BUN: 16 mg/dL (ref 6–23)
CO2: 29 mEq/L (ref 19–32)
Calcium: 9.5 mg/dL (ref 8.4–10.5)
Chloride: 106 mEq/L (ref 96–112)
Creatinine, Ser: 0.9 mg/dL (ref 0.4–1.2)
GFR: 69.87 mL/min (ref >60.00)
Glucose, Bld: 79 mg/dL (ref 70–99)
Potassium: 3.9 mEq/L (ref 3.5–5.1)
Sodium: 142 mEq/L (ref 135–145)

## 2011-05-07 LAB — CBC WITH DIFFERENTIAL/PLATELET
Basophils Absolute: 0.1 10*3/uL (ref 0.0–0.1)
Basophils Relative: 0.7 % (ref 0.0–3.0)
Eosinophils Absolute: 0.4 10*3/uL (ref 0.0–0.7)
Eosinophils Relative: 5.6 % — ABNORMAL HIGH (ref 0.0–5.0)
HCT: 41.2 % (ref 36.0–46.0)
Hemoglobin: 13.9 g/dL (ref 12.0–15.0)
Lymphocytes Relative: 21.8 % (ref 12.0–46.0)
Lymphs Abs: 1.7 10*3/uL (ref 0.7–4.0)
MCHC: 33.7 g/dL (ref 30.0–36.0)
MCV: 92 fl (ref 78.0–100.0)
Monocytes Absolute: 0.7 10*3/uL (ref 0.1–1.0)
Monocytes Relative: 9.6 % (ref 3.0–12.0)
Neutro Abs: 4.9 10*3/uL (ref 1.4–7.7)
Neutrophils Relative %: 62.3 % (ref 43.0–77.0)
Platelets: 228 10*3/uL (ref 150.0–400.0)
RBC: 4.48 Mil/uL (ref 3.87–5.11)
RDW: 13.6 % (ref 11.5–14.6)
WBC: 7.8 10*3/uL (ref 4.5–10.5)

## 2011-05-07 LAB — ALT: ALT: 24 U/L (ref 0–35)

## 2011-05-07 LAB — LIPID PANEL
Cholesterol: 259 mg/dL — ABNORMAL HIGH (ref 0–200)
HDL: 79.9 mg/dL (ref >39.00)
Total CHOL/HDL Ratio: 3
Triglycerides: 61 mg/dL (ref 0.0–149.0)
VLDL: 12.2 mg/dL (ref 0.0–40.0)

## 2011-05-07 LAB — LDL CHOLESTEROL, DIRECT: Direct LDL: 153.6 mg/dL

## 2011-05-07 LAB — TSH: TSH: 1.39 u[IU]/mL (ref 0.35–5.50)

## 2011-05-07 LAB — AST: AST: 28 U/L (ref 0–37)

## 2011-05-07 NOTE — Progress Notes (Signed)
Labs only

## 2011-05-07 NOTE — Progress Notes (Signed)
12  

## 2011-05-11 ENCOUNTER — Encounter: Payer: Self-pay | Admitting: Internal Medicine

## 2011-05-15 HISTORY — PX: COLONOSCOPY W/ POLYPECTOMY: SHX1380

## 2011-05-16 ENCOUNTER — Encounter: Payer: Self-pay | Admitting: Internal Medicine

## 2011-06-11 ENCOUNTER — Telehealth: Payer: Self-pay

## 2011-06-11 NOTE — Telephone Encounter (Signed)
LM FOR Emily Thompson THAT DR. Darrold Span ORDERED A CBC AND C-MET  FOR 06-13-11 VISIT.  CANCELLED THAT LAB APPT. SINCE THESE LABS WERE DONE AT APPT. WITH PCP ON 05-07-11.   REQUESTED THAT SHE REGISTER FOR HER APPT. ~930 SO SHE CAN BE PROCESSED AND SEE THE MD AT 1000.

## 2011-06-13 ENCOUNTER — Other Ambulatory Visit: Payer: Medicare Other | Admitting: Lab

## 2011-06-13 ENCOUNTER — Ambulatory Visit (HOSPITAL_BASED_OUTPATIENT_CLINIC_OR_DEPARTMENT_OTHER): Payer: Medicare Other | Admitting: Oncology

## 2011-06-13 ENCOUNTER — Encounter: Payer: Self-pay | Admitting: Oncology

## 2011-06-13 VITALS — BP 138/67 | HR 65 | Temp 97.0°F | Ht 60.0 in | Wt 140.9 lb

## 2011-06-13 DIAGNOSIS — Z853 Personal history of malignant neoplasm of breast: Secondary | ICD-10-CM

## 2011-06-13 DIAGNOSIS — Z1231 Encounter for screening mammogram for malignant neoplasm of breast: Secondary | ICD-10-CM

## 2011-06-13 DIAGNOSIS — C439 Malignant melanoma of skin, unspecified: Secondary | ICD-10-CM

## 2011-06-13 NOTE — Progress Notes (Signed)
OFFICE PROGRESS NOTE Date of Visit: 06-13-2011 Physicians: J.Paz, D.Gottsegen, D.Newman, A.Little, H.Gruber; J.Mann  INTERVAL HISTORY:   Patient is seen, alone for visit today, in scheduled yearly follow up of her history of breast carcinoma. 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 She had adjuvant CMF chemotherapy, local radiation, tamoxifen from Nov 2003 thru April 2005 then Femara from May 2005 thru Jan 2009. She is now on observation only. Last mammograms were at [facility] November 20, 2010 with no findings of concern and annual screening mammograms recommended. She did have bilateral breast plastic surgery (implant on left and reduction on right) done at [facility] ~ 2005. Patient tells me that she has been doing well overall since she was here last. Mild headaches have resolved and significant hot flashes have considerably improved since she has been off statin drugs recently (hot flashes which we had thought related to tamoxifen then to Femara, but which had never improved off those meds until statins DCd). 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 Danella Deis. She has had flu vaccine (and shingles and pneumovax). She has had no recent infectious illnesses. She continues to exercise regularly. Dr. Clarene Duke is working with cholesterol, to try crestor once weekly to see if she tolerates that. She had labs by primary in Dec which I have reviewed in Epic.  Review of Systems otherwise: intermittent, transient discomfort various areas of both breasts, including superior right breast (which may be related to costochondritis) and right nipple (seems related to scarring there from reduction surgery), and inferiorly at reconstructed left breast when clothing tight, also on rib  inferior/lateral where infusion port was in place for the reconstruction on left.  No new or different respiratory symptoms, no neurologic symptoms, no cardiac type pain, good appetite, bowels unchanged (is due colonoscopy), no bladder symptoms, no bleeding. Remainder of 10 point Review of Systems unchanged.   Objective:  Vital signs in last 24 hours:  BP 138/67  Pulse 65  Temp(Src) 97 F (36.1 C) (Oral)  Ht 5' (1.524 m)  Wt 140 lb 14.4 oz (63.912 kg)  BMI 27.52 kg/m2  LMP 05/14/1997  Alert, appears comfortable, excellent historian.  HEENT:mucous membranes moist, pharynx normal without lesions. Slightly erythematous scars vertical right cheek and above lip on left, without residual skin lesions there. PERRL. Neck supple without JVD. LymphaticsCervical, supraclavicular, and axillary nodes normal. No inguinal adenopathy Resp: clear to auscultation bilaterally and normal percussion bilaterally Cardio: regular rate and rhythm. Point tenderness over mid left costosternal junction. GI: soft, non-tender; bowel sounds normal; no masses,  no organomegaly Extremities: extremities normal, atraumatic, no cyanosis or edema Breasts: Left has scarring and retraction inferior to nipple and firmness from implant especially inferiorly, no evidence of local recurrence, no skin changes of concern. Left axilla benign, no swelling LUE. Right breast with healed mammoplasty scars, no dominant masses, no skin changes of concern, no nipple findings, no tenderness to palpation now. Right axilla and UE likewise not remarkable.   Lab Results:  CBC 05-07-11 WBC 7.8, ANC 4.9, Hgb 13.9 and plt 228K, MCV 92, slight increase in eosinophils consistent with chronic allergies Bmet normal including creatinine 0.9, ALT and AST normal, TSH 1.39    Studies/Results:  No results found.  Medications: I have reviewed  the patient's current medications.  Assessment/Plan:  1.T1N0 left breast cancer with history as above,  now out 10 years from diagnosis, without recurrence. She prefers to continue yearly follow up at this office; labs will be by primary MD. She will have mammograms in July. 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 3.Hot flashes related to statin drugs        LIVESAY,LENNIS P, MD   06/13/2011, 10:38 AM

## 2011-08-13 ENCOUNTER — Ambulatory Visit: Payer: Medicare Other | Admitting: Family Medicine

## 2011-08-13 ENCOUNTER — Ambulatory Visit (INDEPENDENT_AMBULATORY_CARE_PROVIDER_SITE_OTHER): Payer: Medicare Other | Admitting: Internal Medicine

## 2011-08-13 ENCOUNTER — Telehealth: Payer: Self-pay | Admitting: Internal Medicine

## 2011-08-13 DIAGNOSIS — J309 Allergic rhinitis, unspecified: Secondary | ICD-10-CM

## 2011-08-13 HISTORY — DX: Allergic rhinitis, unspecified: J30.9

## 2011-08-13 MED ORDER — FLUTICASONE PROPIONATE 50 MCG/ACT NA SUSP: 2.0000 | Freq: Every day | NASAL | Status: DC

## 2011-08-13 MED ORDER — HYDROCODONE-HOMATROPINE 5-1.5 MG/5ML PO SYRP
5.0000 mL | ORAL_SOLUTION | Freq: Three times a day (TID) | ORAL | Status: DC | PRN
Start: 2011-08-13 — End: 2013-05-27

## 2011-08-13 MED ORDER — AZITHROMYCIN 250 MG PO TABS: ORAL_TABLET | ORAL | Status: AC

## 2011-08-13 NOTE — Assessment & Plan Note (Signed)
Patient become symptomatic 2 and half days ago, very likely she has allergies but at the same time she is afraid she may be developing sinusitis. Plan: treat allergies, see instructions. If not improving soon, will take a Z-Pak.

## 2011-08-13 NOTE — Patient Instructions (Signed)
Rest, fluids , tylenol For cough, take Mucinex twice a day as needed  Use hydrocodone if the cough persist, watch for somnolence For allergies use 1 tablet of either Zyrtec or Claritin over-the-counter daily and to use it better. For congestion use flonase as Rx Take the antibiotic as prescribed  (zpack) only if not improving in few days  Call if no better in few days Call anytime if the symptoms are severe

## 2011-08-13 NOTE — Telephone Encounter (Signed)
Patient has an appointment this afternoon at 3:00--SS  [facility] Triage Call Report Triage Record Num: [id number] Operator: Caswell Corwin Patient Name: Emily Thompson Call Date & Time: 08/13/2011 1:26:13PM Patient Phone: [phone] PCP: Nolon Rod. Paz Patient Gender: Female PCP Fax : Patient DOB: 1946-04-29 Practice Name: [facility] Reason for Call: Caller: [name]/Patient; PCP: Willow Ora; CB#: [phone]; Call regarding Wanting Meds Called in for Sinus Infection that started 08/11/11. Triaged Allergies and all emergent SX R/O. N eeds to be seen in 24 hrs for eval. No anbx may be called in. Home care and call back inst given. Appt made at 1415 todya with Paz. Protocol(s) Used: Allergies Recommended Outcome per Protocol: See Provider within 24 hours Reason for Outcome: Being treated by provider for allergies AND symptoms not responding or are worsening with prescribed treatment in expected timeframe Care Advice: Most adults need to drink 6-10 eight-ounce glasses (1.2-2.0 liters) of fluids per day unless previously told to limit fluid intake for other medical reasons. Limit fluids that contain caffeine, sugar or alcohol. Urine will be a very light yellow color when you drink enough fluids. ~

## 2011-08-13 NOTE — Progress Notes (Signed)
  Subjective:    Patient ID: Emily Thompson, female    DOB: July 03, 1945, 66 y.o.   MRN: [id number]  HPI Acute visit  Symptoms started 2 days ago  with headache and sinus congestion, postnasal dripping, dry cough and sneezing. She has a history of allergies but sometimes  gets sinusitis as well.  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 Medication list is reviewed, she was started on Crestor 5 mg weekly and tolerating it well. This is followed by Dr. Clarene Duke. She is intolerant to dextromethorphan and has some GI side effects from morphine but she can tolerate hydrocodone as needed.    Objective:   Physical Exam  General -- alert, well-developed, and well-nourished. NAD  Neck --no LADs HEENT -- TMs normal, throat w/o redness, face symmetric and not tender to palpation, nose slightly congested  Lungs -- normal respiratory effort, no intercostal retractions, no accessory muscle use, and normal breath sounds.   Heart-- normal rate, regular rhythm, no murmur, and no gallop.  Extremities-- no pretibial edema bilaterally      Assessment & Plan:

## 2011-08-14 ENCOUNTER — Encounter: Payer: Self-pay | Admitting: Internal Medicine

## 2011-10-09 ENCOUNTER — Other Ambulatory Visit: Payer: Self-pay | Admitting: Dermatology

## 2011-12-05 ENCOUNTER — Encounter (INDEPENDENT_AMBULATORY_CARE_PROVIDER_SITE_OTHER): Payer: Self-pay | Admitting: Surgery

## 2012-01-09 ENCOUNTER — Other Ambulatory Visit (HOSPITAL_COMMUNITY)
Admission: RE | Admit: 2012-01-09 | Discharge: 2012-01-09 | Disposition: A | Discharge status: Home / Self Care | Payer: Medicare Other | Source: Ambulatory Visit | Attending: Obstetrics and Gynecology | Admitting: Obstetrics and Gynecology

## 2012-01-09 ENCOUNTER — Ambulatory Visit (INDEPENDENT_AMBULATORY_CARE_PROVIDER_SITE_OTHER): Payer: Medicare Other | Admitting: Obstetrics and Gynecology

## 2012-01-09 ENCOUNTER — Encounter: Payer: Self-pay | Admitting: Obstetrics and Gynecology

## 2012-01-09 VITALS — BP 130/74 | Ht 60.0 in | Wt 141.0 lb

## 2012-01-09 DIAGNOSIS — C439 Malignant melanoma of skin, unspecified: Secondary | ICD-10-CM | POA: Insufficient documentation

## 2012-01-09 DIAGNOSIS — Z124 Encounter for screening for malignant neoplasm of cervix: Secondary | ICD-10-CM

## 2012-01-09 DIAGNOSIS — D1739 Benign lipomatous neoplasm of skin and subcutaneous tissue of other sites: Secondary | ICD-10-CM

## 2012-01-09 DIAGNOSIS — B977 Papillomavirus as the cause of diseases classified elsewhere: Secondary | ICD-10-CM

## 2012-01-09 DIAGNOSIS — M8580 Other specified disorders of bone density and structure, unspecified site: Secondary | ICD-10-CM

## 2012-01-09 DIAGNOSIS — N952 Postmenopausal atrophic vaginitis: Secondary | ICD-10-CM

## 2012-01-09 DIAGNOSIS — M899 Disorder of bone, unspecified: Secondary | ICD-10-CM

## 2012-01-09 DIAGNOSIS — D171 Benign lipomatous neoplasm of skin and subcutaneous tissue of trunk: Secondary | ICD-10-CM

## 2012-01-09 DIAGNOSIS — C50919 Malignant neoplasm of unspecified site of unspecified female breast: Secondary | ICD-10-CM

## 2012-01-09 NOTE — Progress Notes (Signed)
Patient came to see me today for further followup. She has had a left breast carcinoma which was treated with lumpectomy, chemotherapy, radiation and tamoxifen which she is currently not on. She has had breast reconstruction on the left as well as a breast lift on the right. She has always had normal Pap smears. She did have a vulvar biopsy in 1999 that was consistent with a squamous papilloma and had  subtle changes consistent with HPV at that time. Her last Pap smear was 2011 and was normal. She has had osteopenia on bone density which has not required treatment. She thinks her last bone density was last year but we did not get a copy. I suspect it  went to her oncologist but she will get [place] a copy. She had a normal mammogram this year. 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 She has a small lipoma on her right buttock  which has remained stable.   ROS: 12 systems reviewed done. Pertinent positives above.other positives include GERD, hypertension, hyperlipidemia, previous melanoma.  Physical examination:Kim Julian Reil present.HEENT within normal limits. Neck: Thyroid not large. No masses. Supraclavicular nodes: not enlarged. Breasts: Examined in both sitting and lying  position. No skin changes and no masses. Status post reconstruction of left breast and breast lift on the right. Abdomen: Soft no guarding rebound or masses or hernia. Pelvic: External: Within normal limits except persistence of a small red lesion external to the hymeneal ring on the right which is previously been biopsied and was a benign papilloma.  BUS: Within normal limits. Vaginal:within normal limits. Poor estrogen effect. No evidence of cystocele rectocele or enterocele. Cervix: clean. Uterus: Normal size and shape. Adnexa: No masses. Rectovaginal exam: Confirmatory and negative. Extremities: Within normal limits. On right buttock is 2 cm  lesion consistent with lipoma and stable from previous years.  Assessment: #1. Breast cancer-no existing disease #2. Atrophic vaginitis #3. Osteopenia #4. 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  Plan: Continue yearly mammograms. Patient to get me her last bone density. Observation of other issues.The new Pap smear guidelines were discussed with the patient. Pap done.

## 2012-01-09 NOTE — Patient Instructions (Signed)
Get me a copy of your latest bone density.

## 2012-01-10 ENCOUNTER — Telehealth: Payer: Self-pay | Admitting: *Deleted

## 2012-01-10 NOTE — Telephone Encounter (Signed)
Pt was seen yesterday and was told to have her last dexa report sent here from [facility]. Report for Aug/2012 is in system.

## 2012-01-11 ENCOUNTER — Encounter (INDEPENDENT_AMBULATORY_CARE_PROVIDER_SITE_OTHER): Payer: Self-pay

## 2012-01-18 ENCOUNTER — Encounter: Payer: Self-pay | Admitting: Obstetrics and Gynecology

## 2012-01-30 ENCOUNTER — Telehealth: Payer: Self-pay | Admitting: Internal Medicine

## 2012-01-30 NOTE — Telephone Encounter (Signed)
Agree w/ advise  

## 2012-01-30 NOTE — Telephone Encounter (Signed)
Please advise 

## 2012-01-30 NOTE — Telephone Encounter (Signed)
Caller: Katrin/Patient; Patient Name: Emily Thompson; PCP: Willow Ora; Best Callback Phone Number: [phone]; Call regarding Csection incission burning,redness and itching, onset 9-16.  Csection was 35 years ago. Patient excercises daily has slight skin flap over incission, states she sweats alot while excercising. Antibiotic oinment used, no relief. Afebrile. All emergent symptoms ruled out per Rash Protocol, see in 24 hours due to presistent itching that interferes with sleep.  Discussed Lotrimin, baking soda sitz bath and Benardyl 25mg  for itching.  Advised Patient to call back if symptoms are not improving within 24 hours.  Patient verbalized understanding.

## 2012-02-13 ENCOUNTER — Encounter (INDEPENDENT_AMBULATORY_CARE_PROVIDER_SITE_OTHER): Payer: Self-pay | Admitting: Surgery

## 2012-02-13 ENCOUNTER — Ambulatory Visit (INDEPENDENT_AMBULATORY_CARE_PROVIDER_SITE_OTHER): Payer: Medicare Other | Admitting: Surgery

## 2012-02-13 VITALS — BP 156/78 | HR 63 | Temp 98.4°F | Ht 60.0 in | Wt 142.0 lb

## 2012-02-13 DIAGNOSIS — Z853 Personal history of malignant neoplasm of breast: Secondary | ICD-10-CM

## 2012-02-13 NOTE — Progress Notes (Addendum)
Name:  Emily Thompson MRN:  [id number] DOB:  [birth year]   ASSESSMENT and PLAN: 1.  Left breast cancer.  T1, N0.  Doing well with no recurrence now 10 years out.  She sees Drs. Paz, Kellogg, and [place].  Since it has been 10 years since her surgery, this can be her last visit.  History of Illness: Emily Thompson is a 66 y.o. (DOB: 16-Oct-1945)  white female who is a patient of Willow Ora, MD and comes to me today for followup for breast cancer.  She is doing well.  No new complaints.  She just visited the [facility] in [place].  We talked about the government shut down.  Breast History: Path (side, TNM): T1, N0 Left Surgery: Left lumpectomy  Date: 07/31/2001 Size of tumor: 1.8  cm Nodes: 0/1 ER: 72% PR: 93% Ki67: 23  HER2Neu: neg  Medical Oncologist: Darrold Span  Radiation Oncologist: Kinard  Past Medical History  Diagnosis Date  . GERD (gastroesophageal reflux disease)   . HX: breast cancer   . Reflux   . Osteopenia   . Cancer     Breast  . Heart palpitations   . Elevated cholesterol   . Hearing aid worn   . Hypertension   . Breast cancer   . Melanoma     Current Outpatient Prescriptions  Medication Sig Dispense Refill  . aspirin 81 MG tablet Take 81 mg by mouth daily.       . Biotin 300 MCG TABS Take 1 tablet by mouth daily.      Marland Kitchen CALCIUM-VITAMIN D PO Take 2 tablets by mouth daily.       . Cholecalciferol (VITAMIN D) 1000 UNITS capsule Take 1,000 Units by mouth daily.        Marland Kitchen diltiazem (CARDIZEM CD) 240 MG 24 hr capsule Take 240 mg by mouth daily.        . fluticasone (FLONASE) 50 MCG/ACT nasal spray Place 2 sprays into the nose daily.  1 g  3  . Omega-3 Fatty Acids (FISH OIL) 1200 MG CAPS Take by mouth 3 (three) times daily.        . pantoprazole (PROTONIX) 40 MG tablet Take 1 tablet (40 mg total) by mouth daily.  90 tablet  3  . riboflavin (VITAMIN B-2) 100 MG TABS Take 100 mg by mouth daily.        . rosuvastatin (CRESTOR) 10 MG tablet Take 5 mg by  mouth once a week.         Allergies  Allergen Reactions  . Dextromethorphan   . Other     PAPER TAPE  . Morphine And Related     PHYSICAL EXAM: BP 156/78  Pulse 63  Temp 98.4 F (36.9 C) (Temporal)  Ht 5' (1.524 m)  Wt 142 lb (64.411 kg)  BMI 27.73 kg/m2  SpO2 98%  LMP 05/14/1997  HEENT:  Pupils equal.  Dentition good.  No injury. NECK:  Supple.  No thyroid mass. LYMPH NODES:  No cervical, supraclavicular, or axillary adenopathy. BREASTS -  RIGHT:  Evidence of reduction.  No mass.   LEFT:  No palpable mass or nodule.  Defect at nipple from biopsy.  Some firmness lower breast.   UPPER EXTREMITIES:  No evidence of lymphedema.  DATA REVIEWED: Mammogram 11/21/11 - neg.

## 2012-03-04 ENCOUNTER — Ambulatory Visit (INDEPENDENT_AMBULATORY_CARE_PROVIDER_SITE_OTHER): Payer: Medicare Other

## 2012-03-04 DIAGNOSIS — Z23 Encounter for immunization: Secondary | ICD-10-CM

## 2012-04-21 ENCOUNTER — Other Ambulatory Visit: Payer: Self-pay | Admitting: Internal Medicine

## 2012-04-21 MED ORDER — PANTOPRAZOLE SODIUM 40 MG PO TBEC: 40.0000 mg | DELAYED_RELEASE_TABLET | Freq: Every day | ORAL | Status: DC

## 2012-04-21 NOTE — Telephone Encounter (Signed)
Called pt, schedule CPE 12.30.13 @ 3p.  Refill done.

## 2012-04-21 NOTE — Telephone Encounter (Signed)
refill Pantoprazole Tab ENT 40 MG Take 1 tablet (40 mg total) by mouth daily -- last wrt 12.21.12 #90 wt/3-refills -- last ov 4.1.13 acute

## 2012-05-12 ENCOUNTER — Encounter: Payer: Self-pay | Admitting: Internal Medicine

## 2012-05-12 ENCOUNTER — Ambulatory Visit (INDEPENDENT_AMBULATORY_CARE_PROVIDER_SITE_OTHER): Payer: Medicare Other | Admitting: Internal Medicine

## 2012-05-12 VITALS — BP 128/72 | HR 60 | Ht 60.0 in | Wt 141.0 lb

## 2012-05-12 DIAGNOSIS — I10 Essential (primary) hypertension: Secondary | ICD-10-CM

## 2012-05-12 DIAGNOSIS — Z0000 Encounter for general adult medical examination without abnormal findings: Secondary | ICD-10-CM

## 2012-05-12 DIAGNOSIS — E785 Hyperlipidemia, unspecified: Secondary | ICD-10-CM

## 2012-05-12 DIAGNOSIS — K219 Gastro-esophageal reflux disease without esophagitis: Secondary | ICD-10-CM

## 2012-05-12 NOTE — Patient Instructions (Addendum)
Come back fasting for labs: CMP --- dx hypertension FLP--- dx  high cholesterol   Fall Prevention and Home Safety Falls cause injuries and can affect all age groups. It is possible to use preventive measures to significantly decrease the likelihood of falls. There are many simple measures which can make your home safer and prevent falls. OUTDOORS  Repair cracks and edges of walkways and driveways.  Remove high doorway thresholds.  Trim shrubbery on the main path into your home.  Have good outside lighting.  Clear walkways of tools, rocks, debris, and clutter.  Check that handrails are not broken and are securely fastened. Both sides of steps should have handrails.  Have leaves, snow, and ice cleared regularly.  Use sand or salt on walkways during winter months.  In the garage, clean up grease or oil spills. BATHROOM  Install night lights.  Install grab bars by the toilet and in the tub and shower.  Use non-skid mats or decals in the tub or shower.  Place a plastic non-slip stool in the shower to sit on, if needed.  Keep floors dry and clean up all water on the floor immediately.  Remove soap buildup in the tub or shower on a regular basis.  Secure bath mats with non-slip, double-sided rug tape.  Remove throw rugs and tripping hazards from the floors. BEDROOMS  Install night lights.  Make sure a bedside light is easy to reach.  Do not use oversized bedding.  Keep a telephone by your bedside.  Have a firm chair with side arms to use for getting dressed.  Remove throw rugs and tripping hazards from the floor. KITCHEN  Keep handles on pots and pans turned toward the center of the stove. Use back burners when possible.  Clean up spills quickly and allow time for drying.  Avoid walking on wet floors.  Avoid hot utensils and knives.  Position shelves so they are not too high or low.  Place commonly used objects within easy reach.  If necessary, use a  sturdy step stool with a grab bar when reaching.  Keep electrical cables out of the way.  Do not use floor polish or wax that makes floors slippery. If you must use wax, use non-skid floor wax.  Remove throw rugs and tripping hazards from the floor. STAIRWAYS  Never leave objects on stairs.  Place handrails on both sides of stairways and use them. Fix any loose handrails. Make sure handrails on both sides of the stairways are as long as the stairs.  Check carpeting to make sure it is firmly attached along stairs. Make repairs to worn or loose carpet promptly.  Avoid placing throw rugs at the top or bottom of stairways, or properly secure the rug with carpet tape to prevent slippage. Get rid of throw rugs, if possible.  Have an electrician put in a light switch at the top and bottom of the stairs. OTHER FALL PREVENTION TIPS  Wear low-heel or rubber-soled shoes that are supportive and fit well. Wear closed toe shoes.  When using a stepladder, make sure it is fully opened and both spreaders are firmly locked. Do not climb a closed stepladder.  Add color or contrast paint or tape to grab bars and handrails in your home. Place contrasting color strips on first and last steps.  Learn and use mobility aids as needed. Install an electrical emergency response system.  Turn on lights to avoid dark areas. Replace light bulbs that burn out immediately. Get light  switches that glow.  Arrange furniture to create clear pathways. Keep furniture in the same place.  Firmly attach carpet with non-skid or double-sided tape.  Eliminate uneven floor surfaces.  Select a carpet pattern that does not visually hide the edge of steps.  Be aware of all pets. OTHER HOME SAFETY TIPS  Set the water temperature for 120 F (48.8 C).  Keep emergency numbers on or near the telephone.  Keep smoke detectors on every level of the home and near sleeping areas. Document Released: 04/20/2002 Document Revised:  10/30/2011 Document Reviewed: 07/20/2011 [facility] Patient Information 2013 [place], [place].

## 2012-05-12 NOTE — Assessment & Plan Note (Signed)
Well controlled. No change. 

## 2012-05-12 NOTE — Assessment & Plan Note (Addendum)
Td ~ 5 to 10 years ago Pneumonia shot -- at age 66 Had a Flu shot  Shingles shot-- completed   cscope-- had one ~ 10 years ago, Dr Loreta Ave , again 09-2011 , next 5 years MMG 11-2011 (-) Female care per gyn Doing great w/ diet-exercise   Also, has occasional back pain, will call me if needs a referral to see orthopedic surgery.

## 2012-05-12 NOTE — Assessment & Plan Note (Signed)
Since the last time she was here, she try Crestor, unable to tolerate even low-dose (one time a week). Plan: Continue with her lifestyle, check FLP

## 2012-05-12 NOTE — Progress Notes (Signed)
  Subjective:    Patient ID: Emily Thompson, female    DOB: 1945-09-25, 66 y.o.   MRN: [id number]  HPI Here for Medicare AWV: 1. Risk factors based on Past M, S, F history: reviewed 2. Physical Activities: goes the gym x 5/week    3. Depression/mood:  No problemss noted or reported   4. Hearing:  Has hearing aids, has regular checks 5. ADL's:  Independent, still drives   6. Fall Risk: no recent falls, counseled 7. home Safety: does feel safe at home   8. Height, weight, &visual acuity: see VS, uses glasses , sees eye doctor regularly 9. Counseling: provided 10. Labs ordered based on risk factors: if needed   11. Referral Coordination: if needed 12.  Care Plan, see assessment and plan   13.   Cognitive Assessment: cognition and motor skills wnl  In addition, today we discussed the following: hypertension-- good medication compliance History of skin cancer, sees dermatology yearly. GERD, good compliance with PPIs, just about every year by October symptoms get worse, she thinks may be related to her diet or stress. Sometimes take double PPIs and gets a little better.  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 Hypertension Mother   . Hypertension Father   . Stroke Father   . Cancer Maternal Aunt     COLON  . Diabetes Maternal Grandmother   . Breast cancer Neg Hx      Review of Systems Denies dysphagia or odynophagia. No weight loss. No nausea, vomiting,  diarrhea or blood in the stools No chest pain, shortness of breath or cough.     Objective:   Physical Exam General -- alert, well-developed, and well-nourished.   Neck --no thyromegaly , normal carotid puls Lungs -- normal respiratory effort, no intercostal retractions, no accessory muscle use, and normal breath sounds.   Heart-- normal rate, regular rhythm, no murmur, and no gallop.   Abdomen--soft, non-tender, no distention, no masses, no HSM, no guarding, and no rigidity.   Extremities-- no pretibial edema bilaterally Neurologic-- alert & oriented X3 and strength normal in all extremities. Psych-- Cognition and judgment appear intact. Alert and cooperative with normal attention span and concentration.  not anxious appearing and not depressed appearing.       Assessment & Plan:

## 2012-05-12 NOTE — Assessment & Plan Note (Signed)
Sx increase usually after October, d/t diet? Plan: Cont PPI, to be taken 30 min before brekfast Precautions discussed Call if sx not well controlled

## 2012-05-13 ENCOUNTER — Other Ambulatory Visit (INDEPENDENT_AMBULATORY_CARE_PROVIDER_SITE_OTHER): Payer: Medicare Other

## 2012-05-13 DIAGNOSIS — E7800 Pure hypercholesterolemia, unspecified: Secondary | ICD-10-CM

## 2012-05-13 DIAGNOSIS — I10 Essential (primary) hypertension: Secondary | ICD-10-CM

## 2012-05-13 LAB — COMPREHENSIVE METABOLIC PANEL
ALT: 19 U/L (ref 0–35)
AST: 21 U/L (ref 0–37)
Albumin: 4 g/dL (ref 3.5–5.2)
Alkaline Phosphatase: 71 U/L (ref 39–117)
BUN: 17 mg/dL (ref 6–23)
CO2: 30 mEq/L (ref 19–32)
Calcium: 9.3 mg/dL (ref 8.4–10.5)
Chloride: 105 mEq/L (ref 96–112)
Creatinine, Ser: 0.8 mg/dL (ref 0.4–1.2)
GFR: 71.57 mL/min (ref >60.00)
Glucose, Bld: 76 mg/dL (ref 70–99)
Potassium: 3.8 mEq/L (ref 3.5–5.1)
Sodium: 140 mEq/L (ref 135–145)
Total Bilirubin: 1.1 mg/dL (ref 0.3–1.2)
Total Protein: 6.9 g/dL (ref 6.0–8.3)

## 2012-05-13 LAB — LIPID PANEL
Cholesterol: 256 mg/dL — ABNORMAL HIGH (ref 0–200)
HDL: 69 mg/dL (ref >39.00)
Total CHOL/HDL Ratio: 4
Triglycerides: 92 mg/dL (ref 0.0–149.0)
VLDL: 18.4 mg/dL (ref 0.0–40.0)

## 2012-05-13 LAB — LDL CHOLESTEROL, DIRECT: Direct LDL: 171.4 mg/dL

## 2012-05-16 MED ORDER — PITAVASTATIN CALCIUM 2 MG PO TABS: ORAL_TABLET | ORAL | Status: DC

## 2012-05-16 NOTE — Addendum Note (Signed)
Addended by: Edwena Felty T on: 05/16/2012 05:00 PM   Modules accepted: Orders

## 2012-06-13 ENCOUNTER — Ambulatory Visit (HOSPITAL_BASED_OUTPATIENT_CLINIC_OR_DEPARTMENT_OTHER): Payer: Medicare Other | Admitting: Oncology

## 2012-06-13 ENCOUNTER — Encounter: Payer: Self-pay | Admitting: Oncology

## 2012-06-13 ENCOUNTER — Telehealth: Payer: Self-pay | Admitting: Oncology

## 2012-06-13 VITALS — BP 141/69 | HR 66 | Temp 96.9°F | Resp 20 | Ht 60.0 in | Wt 143.0 lb

## 2012-06-13 DIAGNOSIS — C50919 Malignant neoplasm of unspecified site of unspecified female breast: Secondary | ICD-10-CM

## 2012-06-13 DIAGNOSIS — C439 Malignant melanoma of skin, unspecified: Secondary | ICD-10-CM

## 2012-06-13 DIAGNOSIS — Z853 Personal history of malignant neoplasm of breast: Secondary | ICD-10-CM

## 2012-06-13 DIAGNOSIS — Z1231 Encounter for screening mammogram for malignant neoplasm of breast: Secondary | ICD-10-CM

## 2012-06-13 NOTE — Progress Notes (Signed)
OFFICE PROGRESS NOTE   06/13/2012   Physicians:  INTERVAL HISTORY: J.Paz, (D.Gottsegen), D.Newman, C.Hilty, H.Gruber; J.Mann  Patient is seen, alone for visit, in scheduled yearly follow up of her history of left breast cancer, on observation. History is of T1N0 ER/PR positive, Her-2 negative left breast cancer at lumpectomy with sentinel node in March 2003. She had adjuvant CMF chemotherapy, local radiation, tamoxifen from Nov 2003 thru April 2005 then Femara from May 2005 thru Jan 2009. She is now on observation only. Last mammograms were at [facility] 11-26-11, with scattered densities and no findings of concern. She is post bilateral breast plastic surgery (implant on left and reduction on right) done at [facility] ~ 2005.  Patient has been doing generally well during the past year, with no complaints that seem referable to breast cancer history or that treatment. She is working out at gym regularly, including core exercises for back. She has had no recent infectious illness. She denies any bleeding. The left breast implant is no more uncomfortable than previously, Remainder of 10 point Review of Systems negative.  Objective:  Vital signs in last 24 hours:  BP 141/69  Pulse 66  Temp 96.9 F (36.1 C) (Oral)  Resp 20  Ht 5' (1.524 m)  Wt 143 lb (64.864 kg)  BMI 27.93 kg/m2  LMP 05/14/1997  Weight is up 2 lbs. Easily mobile, looks comfortable, talkative and very pleasant  HEENT:PERRLA, sclera clear, anicteric, oropharynx clear, no lesions and neck supple with midline trachea LymphaticsCervical, supraclavicular, and axillary nodes normal. Resp: clear to auscultation bilaterally and normal percussion bilaterally Cardio: regular rate and rhythm GI: soft, non-tender; bowel sounds normal; no masses,  no organomegaly Extremities: extremities normal, atraumatic, no cyanosis or edema Neuro:no sensory deficits noted Breasts: right with well healed reduction mammoplasty scars, no dominant mass,  no skin or nipple findings of concern and axilla benign. Left breast has deformity inferiorly up to nipple and implant which is very firm/ tight as previously, not tender. Nipple is retracted inferiorly as it has been. Laterally from nipple to axillary line is soft with minimal probable edema and very slight erythema which seems to be from clothing, no other skin findings including peau d'orange, no mass. Skin without ecchymosis or rash  Lab Results: Labs from PCP: Results for orders placed in visit on 05/13/12  COMPREHENSIVE METABOLIC PANEL      Component Value Range   Sodium 140  135 - 145 mEq/L   Potassium 3.8  3.5 - 5.1 mEq/L   Chloride 105  96 - 112 mEq/L   CO2 30  19 - 32 mEq/L   Glucose, Bld 76  70 - 99 mg/dL   BUN 17  6 - 23 mg/dL   Creatinine, Ser 0.8  0.4 - 1.2 mg/dL   Total Bilirubin 1.1  0.3 - 1.2 mg/dL   Alkaline Phosphatase 71  39 - 117 U/L   AST 21  0 - 37 U/L   ALT 19  0 - 35 U/L   Total Protein 6.9  6.0 - 8.3 g/dL   Albumin 4.0  3.5 - 5.2 g/dL   Calcium 9.3  8.4 - 16.1 mg/dL   GFR 09.60  >45.40 mL/min  LIPID PANEL      Component Value Range   Cholesterol 256 (*) 0 - 200 mg/dL   Triglycerides 98.1  0.0 - 149.0 mg/dL   HDL 19.14  >78.29 mg/dL   VLDL 56.2  0.0 - 13.0 mg/dL   Total CHOL/HDL Ratio 4  LDL CHOLESTEROL, DIRECT      Component Value Range   Direct LDL 171.4       Studies/Results:  Mammograms [facility] 11-25-12 as noted above.  Last bone density scan was at [facility] 01-03-11  Medications: I have reviewed the patient's current medications.  Assessment/Plan:  1.T1N0 left breast cancer with history as above, now out almost 11 years from diagnosis, without recurrence. She wants to schedule yearly follow up at this office, tho if she has established with another gynecologist and otherwise is comfortable, she will call to change next year's visit to prn.  Labs will be by primary MD. She will have mammograms in July.  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  3.Hot flashes related to statin drugs 4. Right reduction mammoplasty and left breast implant done at [facility] ~ 2005  Reece Packer, MD   06/13/2012, 1:14 PM

## 2012-06-13 NOTE — Patient Instructions (Signed)
Bone density was Aug 2012

## 2012-06-13 NOTE — Telephone Encounter (Signed)
Gave pt appt for MD ,40yr from today January 2015, labs will be drawn by PCP, mammogram scheduled  @ [facility]  on 11/21/12 10 am pt aware

## 2012-07-10 ENCOUNTER — Telehealth: Payer: Self-pay | Admitting: Internal Medicine

## 2012-07-10 MED ORDER — PANTOPRAZOLE SODIUM 40 MG PO TBEC: 40.0000 mg | DELAYED_RELEASE_TABLET | Freq: Every day | ORAL | Status: DC

## 2012-07-10 NOTE — Telephone Encounter (Signed)
Refill done.  

## 2012-07-10 NOTE — Telephone Encounter (Signed)
refill PantoprazoleTablet ENT 40 MG Take 1 tablet (40 mg total) by mouth daily. -requesting 90-day supply no last fill date last wrt 12.9.13 #90

## 2012-07-23 ENCOUNTER — Telehealth: Payer: Self-pay | Admitting: Internal Medicine

## 2012-07-23 NOTE — Telephone Encounter (Signed)
Per labs FLP, AST, ALT in 6 weeks, Discuss with patient appt scheduled.

## 2012-07-23 NOTE — Telephone Encounter (Signed)
pt states she is on a new statin and was wondering if she needs to come back for labs or follow up? cb# [phone]

## 2012-07-28 ENCOUNTER — Other Ambulatory Visit (INDEPENDENT_AMBULATORY_CARE_PROVIDER_SITE_OTHER): Payer: Medicare Other

## 2012-07-28 DIAGNOSIS — E785 Hyperlipidemia, unspecified: Secondary | ICD-10-CM

## 2012-07-28 LAB — LDL CHOLESTEROL, DIRECT: Direct LDL: 114.7 mg/dL

## 2012-07-28 LAB — LIPID PANEL
Cholesterol: 213 mg/dL — ABNORMAL HIGH (ref 0–200)
HDL: 65.7 mg/dL (ref >39.00)
Total CHOL/HDL Ratio: 3
Triglycerides: 70 mg/dL (ref 0.0–149.0)
VLDL: 14 mg/dL (ref 0.0–40.0)

## 2012-07-28 LAB — ALT: ALT: 21 U/L (ref 0–35)

## 2012-07-28 LAB — AST: AST: 23 U/L (ref 0–37)

## 2012-07-29 ENCOUNTER — Telehealth: Payer: Self-pay | Admitting: *Deleted

## 2012-07-29 MED ORDER — PITAVASTATIN CALCIUM 2 MG PO TABS: 1.0000 | ORAL_TABLET | Freq: Every day | ORAL | Status: DC

## 2012-07-29 NOTE — Telephone Encounter (Signed)
Wanda Plump, MD - send a Rx for LIVALO 2mg  1 po qhs #90, 2 RFs Refill done.

## 2012-07-29 NOTE — Telephone Encounter (Signed)
Patient called triage requesting 90 day refill for Livalo 2mg , did not see this on med list.

## 2012-09-10 ENCOUNTER — Telehealth: Payer: Self-pay | Admitting: Internal Medicine

## 2012-09-10 NOTE — Telephone Encounter (Signed)
Patient Information:  Caller Name: Emily Thompson  Phone: [phone]  Patient: Emily Thompson  Gender: Female  DOB: 1945/05/28  Age: 67 Years  PCP: Willow Ora  Office Follow Up:  Does the office need to follow up with this patient?: No  Instructions For The Office: N/A Patient does not want to be seen for cold. She is treating at home  RN Note:  standing order called to [facility], Ginette Otto [phone] . Polytrim eye dropts - 2 drops both eyes QID x5 days, NR 1 bottle per  Standing order. Home care instructions and call back parameters reviewed.  Symptoms  Reason For Call & Symptoms: Patient states cold symptoms for a week.  Her cold symptoms, scratchy throat, barky cough and nasal congestion.  Afebrile. . She  states onset last night, right eye, draining milky cloudy/pus yellow.  Lashes matted, burning, sclera is irritated and red/pink. Underneath lid is pink  Reviewed Health History In EMR: Yes  Reviewed Medications In EMR: Yes  Reviewed Allergies In EMR: Yes  Reviewed Surgeries / Procedures: Yes  Date of Onset of Symptoms: 09/09/2012  Treatments Tried: advil.  Treatments Tried Worked: No  Guideline(s) Used:  Eye - Pus or Discharge  Disposition Per Guideline:   Home Care  Reason For Disposition Reached:   Eye with yellow/green discharge or eyelashes stick together, and PCP standing order to call in antibiotic eye drops  Advice Given:  Reassurance:  A small amount of pus or mucus in the inner corner of the eye is often due to a cold or virus. Sometimes it's just a reaction to an irritant in the eye.  You can get pink eye from someone who has had it recently.  Pink eye will not harm your eyesight.  Viral conjunctivitis (pink eye) does not need antibiotic treatment. It gets better by itself. Usually it's gone in 2 or 3 days.  Here is some care advice that should help.  Eyelid Cleansing:   Gently wash eyelids and lashes with warm water and wet cotton balls (or cotton gauze).  Remove all the dried and liquid pus.  Do this as often as needed.  Contagiousness:  Pinkeye is contagious. It is very easy to spread to other people. You can spread it by shaking hands.  Try not to touch your eyes. Wash your hands often. Do not share towels. Try not to touch your eyes. Wash your hands frequently. Do not share towels.  You may return to work or school. Avoid physical contact (e.g., shaking hands) until the symptoms have resolved.  Call Back If  Pus increases in amount  Pus in corner of eye lasts over 3 days  Eyelid becomes red or swollen  You become worse.  Reassurance:  People often get pink eye (conjunctivitis) when they have a cold.  You can also get pink eye from someone who has had it recently.  Patient Will Follow Care Advice:  YES

## 2012-09-13 ENCOUNTER — Ambulatory Visit (INDEPENDENT_AMBULATORY_CARE_PROVIDER_SITE_OTHER): Payer: Medicare Other | Admitting: Family Medicine

## 2012-09-13 VITALS — BP 164/91 | HR 66 | Temp 98.0°F | Resp 16 | Ht 60.0 in | Wt 142.4 lb

## 2012-09-13 DIAGNOSIS — J019 Acute sinusitis, unspecified: Secondary | ICD-10-CM

## 2012-09-13 DIAGNOSIS — H9201 Otalgia, right ear: Secondary | ICD-10-CM

## 2012-09-13 DIAGNOSIS — J069 Acute upper respiratory infection, unspecified: Secondary | ICD-10-CM

## 2012-09-13 DIAGNOSIS — H9209 Otalgia, unspecified ear: Secondary | ICD-10-CM

## 2012-09-13 MED ORDER — CEFDINIR 300 MG PO CAPS: 300.0000 mg | ORAL_CAPSULE | Freq: Two times a day (BID) | ORAL | Status: DC

## 2012-09-13 NOTE — Patient Instructions (Addendum)
Drink plenty of fluids to help keep the secretions thin  To get sufficient rest  Take antibiotic twice daily as directed  Use the Flonase daily  He can take an over-the-counter decongestant, such as a Claritin-D, if the drainage and congestion remain bad.  Return if worse or not improving.

## 2012-09-13 NOTE — Progress Notes (Signed)
Subjective: 67 year old lady who has been having problems with an upper sparked or infection for the past 2 weeks. She had a congestion and drainage. This morning she woke up worse. She had right otalgia, hurts in the right side of her head, is coughing. She has not had a fever. She lives alone, does not know of anyone she has been exposed to with this. She does have Flonase at home but she quit using it because of some epistaxis. Generally she is a healthy lady  Objective: TMs appear normal. Eyes PERRLA. Throat is erythematous it is straight down the right side. Neck supple without major nodes the chest couple of small nodes in the right. Chest is clear to auscultation. Heart regular without any murmurs.  Assessment: URI with secondary sinusitis and right otalgia  Plan: Since this is come on after 2 weeks of the URI she was placed on antibiotics. She stated that amoxicillin doesn't work, and explained to her that it doesn't work if she it is being used for things it won't respond to. Omnicef Return if worse

## 2012-09-15 ENCOUNTER — Encounter: Payer: Self-pay | Admitting: Internal Medicine

## 2012-09-15 ENCOUNTER — Telehealth: Payer: Self-pay | Admitting: Internal Medicine

## 2012-09-15 ENCOUNTER — Ambulatory Visit (INDEPENDENT_AMBULATORY_CARE_PROVIDER_SITE_OTHER): Payer: Medicare Other | Admitting: Internal Medicine

## 2012-09-15 VITALS — BP 138/80 | HR 67 | Temp 98.2°F | Wt 145.0 lb

## 2012-09-15 DIAGNOSIS — J069 Acute upper respiratory infection, unspecified: Secondary | ICD-10-CM

## 2012-09-15 MED ORDER — FLUTICASONE PROPIONATE 50 MCG/ACT NA SUSP: 2.0000 | Freq: Every day | NASAL | Status: DC

## 2012-09-15 NOTE — Telephone Encounter (Signed)
Patient Information:  Caller Name: Roxi  Phone: [phone]  Patient: Emily Thompson, Emily Thompson  Gender: Female  DOB: 01/04/1946  Age: 67 Years  PCP: Willow Ora  Office Follow Up:  Does the office need to follow up with this patient?: No  Instructions For The Office: N/A   Symptoms  Reason For Call & Symptoms: eye drainage (yellow eye drainage).  Pt was prescribed eye drops Polytrim eye drops.  Pt was also seen in the [facility] on 09/13/12 for ear pain (pt was prescribed antiobiotic).  Pt reports there is still some discharge coming out of her eyes  Reviewed Health History In EMR: Yes  Reviewed Medications In EMR: Yes  Reviewed Allergies In EMR: Yes  Reviewed Surgeries / Procedures: Yes  Date of Onset of Symptoms: 09/09/2012  Treatments Tried: polytrim  Treatments Tried Worked: No  Guideline(s) Used:  Eye - Pus or Discharge  Disposition Per Guideline:   See Today in Office  Reason For Disposition Reached:   Using antibiotic eyedrops > 3 days but pus persists  Advice Given:  N/A  Patient Will Follow Care Advice:  YES  Appointment Scheduled:  09/15/2012 13:45:00 Appointment Scheduled Provider:  Willow Ora

## 2012-09-15 NOTE — Progress Notes (Signed)
  Subjective:    Patient ID: Emily Thompson, female    DOB: 02/29/1946, 67 y.o.   MRN: [id number]  HPI Acute visit Symptoms started approximately 2 weeks ago with sinus congestion, sore throat, right facial pain. 5 days ago developed eye discharge initially on the right and then bilateral. Has been taking Polytrim eyedrops for 5 days, symptoms are better but not completely well, still has some clear discharge in the morning, likes to be sure her eyes are okay. Went to the urgent care a couple of days ago,Rx  Omnicef for the URI, feeling better.  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     Objective:   Physical Exam General -- alert, well-developed, No apparent distress  HEENT -- TMs Bulged on the right, normal on the left; throat w/o redness, face symmetric , nose congested EOMI, PERRLA, ant chambers normal, no d/c noted, minimal conjunctival erythema  Lungs -- normal respiratory effort, no intercostal retractions, no accessory muscle use, and normal breath sounds.   Heart-- normal rate, regular rhythm, no murmur, and no gallop.    Neurologic-- alert & oriented X3 and strength normal in all extremities. Psych-- Cognition and judgment appear intact. Alert and cooperative with normal attention span and concentration.  not anxious appearing and not depressed appearing.       Assessment & Plan:  URI, URI with conjunctivitis,  getting better, does not need Antibiotic eyedrops anymore. Recommend to finish her antibiotic, a refill for Flonase was sent, warm compress to the eye, call if not completely well in few days.

## 2012-09-15 NOTE — Patient Instructions (Addendum)
Warm compress Artificial tears as needed Call if not back to normal in few day

## 2012-11-24 ENCOUNTER — Encounter: Payer: Self-pay | Admitting: Women's Health

## 2013-01-08 ENCOUNTER — Ambulatory Visit (INDEPENDENT_AMBULATORY_CARE_PROVIDER_SITE_OTHER): Payer: Medicare Other | Admitting: *Deleted

## 2013-01-08 DIAGNOSIS — Z23 Encounter for immunization: Secondary | ICD-10-CM

## 2013-01-14 ENCOUNTER — Encounter: Payer: Self-pay | Admitting: Women's Health

## 2013-01-14 ENCOUNTER — Ambulatory Visit (INDEPENDENT_AMBULATORY_CARE_PROVIDER_SITE_OTHER): Payer: Medicare Other | Admitting: Women's Health

## 2013-01-14 VITALS — BP 130/70 | Ht 60.0 in | Wt 142.4 lb

## 2013-01-14 DIAGNOSIS — N952 Postmenopausal atrophic vaginitis: Secondary | ICD-10-CM

## 2013-01-14 NOTE — Progress Notes (Signed)
Emily Thompson [birth year] [id number]    History:    The patient presents for breast and pelvic exam. 2003 left breast cancer lumpectomy/chemotherapy/radiation. Reconstruction 2005. BRCA testing not done. History of right face melanoma and basal cell skin cancer, has annual screens with Dr. Danella Deis. Normal Pap history. Hypertension/GERD/hypercholesterolemia primary care manages labs and meds. 1 benign polyp on colonoscopy 2014. Current on vaccines. 2012 DEXA T score -1.9 of left femoral neck, FRAX 10.9%/1.7%. Boniva in the past did not tolerate well.  Past medical history, past surgical history, family history and social history were all reviewed and documented in the EPIC chart. Parents hypertension. Husband died of lymphoma 10 years ago, has 2 daughters. Retired Engineer, civil (consulting).   Exam:  Filed Vitals:   01/14/13 1359  BP: 130/70    General appearance:  Normal Head/Neck:  Normal, without cervical or supraclavicular adenopathy. Thyroid:  Symmetrical, normal in size, without palpable masses or nodularity. Respiratory  Effort:  Normal  Auscultation:  Clear without wheezing or rhonchi Cardiovascular  Auscultation:  Regular rate, without rubs, murmurs or gallops  Edema/varicosities:  Not grossly evident Abdominal  Soft,nontender, without masses, guarding or rebound.  Liver/spleen:  No organomegaly noted  Hernia:  None appreciated  Skin  Inspection:  Grossly normal  Palpation:  Grossly normal Neurologic/psychiatric  Orientation:  Normal with appropriate conversation.  Mood/affect:  Normal  Genitourinary    Breasts: Examined lying and sitting.     Right: Without masses, retractions, discharge or axillary adenopathy/reduction with lift.     Left: Implant, no palpable nodules   Inguinal/mons:  Normal without inguinal adenopathy  External genitalia:  Normal  BUS/Urethra/Skene's glands:  Normal  Bladder:  Normal  Vagina:  Atrophic  Cervix:  Normal  Uterus:   normal in size, shape and  contour.  Midline and mobile  Adnexa/parametria:     Rt: Without masses or tenderness.   Lt: Without masses or tenderness.  Anus and perineum: Normal  Digital rectal exam: Normal sphincter tone without palpated masses or tenderness  Assessment/Plan:  67 y.o. WWF G1P1 and 1 adopted for breast and pelvic exam.   Osteopenia  Left breast cancer 2003 Melanoma-annual skin checks Hypertension/GERD/hypercholesterolemia-primary care manages labs and meds Benign polyp colonoscopy 2014  Plan: Schedule DEXA this year and instructed to have report sent here.  SBE's, continue annual mammogram, calcium rich diet, vitamin D 2000 daily. Home safety, fall prevention and importance of continuing regular exercise and healthy lifestyle. Pap normal 2013, new screening guidelines reviewed. Vaginal lubricants encouraged with intercourse   Harrington Challenger [facility], 2:49 PM 01/14/2013

## 2013-01-14 NOTE — Patient Instructions (Addendum)
Health Recommendations for Postmenopausal Women Respected and ongoing research has looked at the most common causes of death, disability, and poor quality of life in postmenopausal women. The causes include heart disease, diseases of blood vessels, diabetes, depression, cancer, and bone loss (osteoporosis). Many things can be done to help lower the chances of developing these and other common problems: CARDIOVASCULAR DISEASE Heart Disease: A heart attack is a medical emergency. Know the signs and symptoms of a heart attack. Below are things women can do to reduce their risk for heart disease.   Do not smoke. If you smoke, quit.  Aim for a healthy weight. Being overweight causes many preventable deaths. Eat a healthy and balanced diet and drink an adequate amount of liquids.  Get moving. Make a commitment to be more physically active. Aim for 30 minutes of activity on most, if not all days of the week.  Eat for heart health. Choose a diet that is low in saturated fat and cholesterol and eliminate trans fat. Include whole grains, vegetables, and fruits. Read and understand the labels on food containers before buying.  Know your numbers. Ask your caregiver to check your blood pressure, cholesterol (total, HDL, LDL, triglycerides) and blood glucose. Work with your caregiver on improving your entire clinical picture.  High blood pressure. Limit or stop your table salt intake (try salt substitute and food seasonings). Avoid salty foods and drinks. Read labels on food containers before buying. Eating well and exercising can help control high blood pressure. STROKE  Stroke is a medical emergency. Stroke may be the result of a blood clot in a blood vessel in the brain or by a brain hemorrhage (bleeding). Know the signs and symptoms of a stroke. To lower the risk of developing a stroke:  Avoid fatty foods.  Quit smoking.  Control your diabetes, blood pressure, and irregular heart rate. THROMBOPHLEBITIS  (BLOOD CLOT) OF THE LEG  Becoming overweight and leading a stationary lifestyle may also contribute to developing blood clots. Controlling your diet and exercising will help lower the risk of developing blood clots. CANCER SCREENING  Breast Cancer: Take steps to reduce your risk of breast cancer.  You should practice "breast self-awareness." This means understanding the normal appearance and feel of your breasts and should include breast self-examination. Any changes detected, no matter how small, should be reported to your caregiver.  After age 40, you should have a clinical breast exam (CBE) every year.  Starting at age 40, you should consider having a mammogram (breast X-ray) every year.  If you have a family history of breast cancer, talk to your caregiver about genetic screening.  If you are at high risk for breast cancer, talk to your caregiver about having an MRI and a mammogram every year.  Intestinal or Stomach Cancer: Tests to consider are a rectal exam, fecal occult blood, sigmoidoscopy, and colonoscopy. Women who are high risk may need to be screened at an earlier age and more often.  Cervical Cancer:  Beginning at age 30, you should have a Pap test every 3 years as long as the past 3 Pap tests have been normal.  If you have had past treatment for cervical cancer or a condition that could lead to cancer, you need Pap tests and screening for cancer for at least 20 years after your treatment.  If you had a hysterectomy for a problem that was not cancer or a condition that could lead to cancer, then you no longer need Pap tests.    If you are between ages 65 and 70, and you have had normal Pap tests going back 10 years, you no longer need Pap tests.  If Pap tests have been discontinued, risk factors (such as a new sexual partner) need to be reassessed to determine if screening should be resumed.  Some medical problems can increase the chance of getting cervical cancer. In these  cases, your caregiver may recommend more frequent screening and Pap tests.  Uterine Cancer: If you have vaginal bleeding after reaching menopause, you should notify your caregiver.  Ovarian cancer: Other than yearly pelvic exams, there are no reliable tests available to screen for ovarian cancer at this time except for yearly pelvic exams.  Lung Cancer: Yearly chest X-rays can detect lung cancer and should be done on high risk women, such as cigarette smokers and women with chronic lung disease (emphysema).  Skin Cancer: A complete body skin exam should be done at your yearly examination. Avoid overexposure to the sun and ultraviolet light lamps. Use a strong sun block cream when in the sun. All of these things are important in lowering the risk of skin cancer. MENOPAUSE Menopause Symptoms: Hormone therapy products are effective for treating symptoms associated with menopause:  Moderate to severe hot flashes.  Night sweats.  Mood swings.  Headaches.  Tiredness.  Loss of sex drive.  Insomnia.  Other symptoms. Hormone replacement carries certain risks, especially in older women. Women who use or are thinking about using estrogen or estrogen with progestin treatments should discuss that with their caregiver. Your caregiver will help you understand the benefits and risks. The ideal dose of hormone replacement therapy is not known. The Food and Drug Administration (FDA) has concluded that hormone therapy should be used only at the lowest doses and for the shortest amount of time to reach treatment goals.  OSTEOPOROSIS Protecting Against Bone Loss and Preventing Fracture: If you use hormone therapy for prevention of bone loss (osteoporosis), the risks for bone loss must outweigh the risk of the therapy. Ask your caregiver about other medications known to be safe and effective for preventing bone loss and fractures. To guard against bone loss or fractures, the following is recommended:  If  you are less than age 50, take 1000 mg of calcium and at least 600 mg of Vitamin D per day.  If you are greater than age 50 but less than age 70, take 1200 mg of calcium and at least 600 mg of Vitamin D per day.  If you are greater than age 70, take 1200 mg of calcium and at least 800 mg of Vitamin D per day. Smoking and excessive alcohol intake increases the risk of osteoporosis. Eat foods rich in calcium and vitamin D and do weight bearing exercises several times a week as your caregiver suggests. DIABETES Diabetes Melitus: If you have Type I or Type 2 diabetes, you should keep your blood sugar under control with diet, exercise and recommended medication. Avoid too many sweets, starchy and fatty foods. Being overweight can make control more difficult. COGNITION AND MEMORY Cognition and Memory: Menopausal hormone therapy is not recommended for the prevention of cognitive disorders such as Alzheimer's disease or memory loss.  DEPRESSION  Depression may occur at any age, but is common in elderly women. The reasons may be because of physical, medical, social (loneliness), or financial problems and needs. If you are experiencing depression because of medical problems and control of symptoms, talk to your caregiver about this. Physical activity and   exercise may help with mood and sleep. Community and volunteer involvement may help your sense of value and worth. If you have depression and you feel that the problem is getting worse or becoming severe, talk to your caregiver about treatment options that are best for you. ACCIDENTS  Accidents are common and can be serious in the elderly woman. Prepare your house to prevent accidents. Eliminate throw rugs, place hand bars in the bath, shower and toilet areas. Avoid wearing high heeled shoes or walking on wet, snowy, and icy areas. Limit or stop driving if you have vision or hearing problems, or you feel you are unsteady with you movements and  reflexes. HEPATITIS C Hepatitis C is a type of viral infection affecting the liver. It is spread mainly through contact with blood from an infected person. It can be treated, but if left untreated, it can lead to severe liver damage over years. Many people who are infected do not know that the virus is in their blood. If you are a "baby-boomer", it is recommended that you have one screening test for Hepatitis C. IMMUNIZATIONS  Several immunizations are important to consider having during your senior years, including:   Tetanus, diptheria, and pertussis booster shot.  Influenza every year before the flu season begins.  Pneumonia vaccine.  Shingles vaccine.  Others as indicated based on your specific needs. Talk to your caregiver about these. Document Released: 06/22/2005 Document Revised: 04/16/2012 Document Reviewed: 02/16/2008 [employer] Patient Information 2014 [employer].  

## 2013-01-23 ENCOUNTER — Telehealth: Payer: Self-pay | Admitting: Oncology

## 2013-01-23 NOTE — Telephone Encounter (Signed)
PT CALLED TO [facility] APPT FOR PT SHE AND LL TALKED ABOUT HER CALLING BACK TO CX IF DECIDED SHE WOULD COME BACK ON A PRN STATUS AND SHE WANTS TO CX.

## 2013-03-02 ENCOUNTER — Other Ambulatory Visit: Payer: Self-pay | Admitting: Dermatology

## 2013-03-06 ENCOUNTER — Encounter: Payer: Self-pay | Admitting: Women's Health

## 2013-03-10 ENCOUNTER — Encounter: Payer: Self-pay | Admitting: *Deleted

## 2013-03-12 ENCOUNTER — Ambulatory Visit (INDEPENDENT_AMBULATORY_CARE_PROVIDER_SITE_OTHER): Payer: Medicare Other

## 2013-03-12 DIAGNOSIS — Z23 Encounter for immunization: Secondary | ICD-10-CM

## 2013-03-17 ENCOUNTER — Other Ambulatory Visit: Payer: Self-pay | Admitting: *Deleted

## 2013-03-17 MED ORDER — PANTOPRAZOLE SODIUM 40 MG PO TBEC: 40.0000 mg | DELAYED_RELEASE_TABLET | Freq: Every day | ORAL | Status: DC

## 2013-03-17 NOTE — Telephone Encounter (Signed)
Pantoprazole refill sent to pharmacy

## 2013-03-20 ENCOUNTER — Other Ambulatory Visit: Payer: Self-pay | Admitting: *Deleted

## 2013-03-20 MED ORDER — PANTOPRAZOLE SODIUM 40 MG PO TBEC: 40.0000 mg | DELAYED_RELEASE_TABLET | Freq: Every day | ORAL | Status: DC

## 2013-03-20 NOTE — Telephone Encounter (Signed)
Pantaprazole refill sent to pharamcy

## 2013-03-24 ENCOUNTER — Other Ambulatory Visit: Payer: Self-pay | Admitting: *Deleted

## 2013-03-24 MED ORDER — PANTOPRAZOLE SODIUM 40 MG PO TBEC: 40.0000 mg | DELAYED_RELEASE_TABLET | Freq: Every day | ORAL | Status: DC

## 2013-04-06 ENCOUNTER — Other Ambulatory Visit: Payer: Self-pay | Admitting: Dermatology

## 2013-05-17 ENCOUNTER — Encounter: Payer: Self-pay | Admitting: *Deleted

## 2013-05-20 ENCOUNTER — Encounter: Payer: Self-pay | Admitting: Internal Medicine

## 2013-05-20 ENCOUNTER — Ambulatory Visit (INDEPENDENT_AMBULATORY_CARE_PROVIDER_SITE_OTHER): Payer: Medicare HMO | Admitting: Internal Medicine

## 2013-05-20 VITALS — BP 160/80 | HR 71 | Ht 60.0 in | Wt 146.5 lb

## 2013-05-20 DIAGNOSIS — R002 Palpitations: Secondary | ICD-10-CM

## 2013-05-20 DIAGNOSIS — E785 Hyperlipidemia, unspecified: Secondary | ICD-10-CM

## 2013-05-20 DIAGNOSIS — I10 Essential (primary) hypertension: Secondary | ICD-10-CM

## 2013-05-20 HISTORY — DX: Palpitations: R00.2

## 2013-05-20 MED ORDER — PITAVASTATIN CALCIUM 4 MG PO TABS: 2.0000 mg | ORAL_TABLET | Freq: Every day | ORAL | Status: DC

## 2013-05-20 MED ORDER — TELMISARTAN 40 MG PO TABS: 40.0000 mg | ORAL_TABLET | Freq: Every morning | ORAL | Status: DC

## 2013-05-20 NOTE — Patient Instructions (Addendum)
Your physician has recommended you make the following change in your medication: START Micardis 40mg  daily (in the morning)  Please schedule an appointment with Erasmo Downer in 2-3 weeks for a BP check.   Your physician recommends that you schedule a follow-up appointment in: 6 months with Dr. Debara Pickett.

## 2013-05-20 NOTE — Progress Notes (Signed)
OFFICE NOTE  Chief Complaint:  Yearly follow-up  Primary Care Physician: Emily November, MD  HPI:  Emily Thompson  is a 68 year old female previously followed by Emily Thompson with history of hypertension, palpitations and dyslipidemia. She has had these palpitations almost her whole life and they are fairly infrequent now that she is on diltiazem. This has also helped her with her hypertension. However, recently she noted her blood pressures have been creeping up slightly. She has also had problems with dyslipidemia including a very high LDL which is probably genetic in nature. Recently, her LDL off of a statin was 171 on May 13, 2012. The total cholesterol was 256, triglycerides 92, HDL of 69. Despite the high HDL, she is at much higher risk and I suspect she has a high LDL particle number. Those tests have not yet been performed. Nevertheless, she is now trying Livalo 2 mg 3 times weekly and hopefully she will be more tolerant of this. Of course, recommended retesting in 2 to 3 months and consideration for lipid NMR test should be given.  Today she brings blood pressure readings which do indicate a higher than normal blood pressure. Her blood pressure in the office was 160/80. She continues only to take a little low twice weekly, mostly due to cost and not necessarily side effects.  PMHx:  Past Medical History  Diagnosis Date  . GERD (gastroesophageal reflux disease)   . Osteopenia   . Breast cancer 2003    chemo, radiation, lumpectomy, reconstructive surgery   . Heart palpitations   . Dyslipidemia   . Hearing aid worn   . Hypertension   . Melanoma     righ side face; basal cell (nose)     Past Surgical History  Procedure Laterality Date  . Breast lumpectomy Left 07/2001  . Breast reconstruction  2005  . Tubal ligation  1978  . Vulva /perineum biopsy      PAPILLOMA  . Cesarean section  1977  . Tonsillectomy and adenoidectomy  1952  . Nose surgery      basal cell  carcinoma removed  . Basal cell carcinoma excision    . Skin cancer excision      melano,a  . Colonoscopy w/ polypectomy  2013    NEXT DUE IN 5 YRS  . Transthoracic echocardiogram  12/2005    EF 50-55%; mild MR; trace TR  . Nm myocar perf wall motion  01/2006    bruce myoview; no evidence of inducible ischemia, anterior wall thinning without evidence of ischemia; post-stress EF 88%; low risk scan     FAMHx:  Family History  Problem Relation Age of Onset  . Hypertension Mother     also GERD, hypothroidism  . Hypertension Father   . Stroke Father   . Cancer Maternal Aunt     COLON  . Diabetes Maternal Grandmother     thyroid disease  . Breast cancer Neg Hx   . Bipolar disorder Brother     SOCHx:   reports that she quit smoking about 40 years ago. Her smoking use included Cigarettes. She smoked 0.00 packs per day for 10 years. She has never used smokeless tobacco. She reports that she drinks about 2.4 ounces of alcohol per week. She reports that she does not use illicit drugs.  ALLERGIES:  Allergies  Allergen Reactions  . Dextromethorphan   . Statins     Headache, hot flashes  . Tape   . Morphine And Related  ROS: A comprehensive review of systems was negative except for: Cardiovascular: positive for palpitations  HOME MEDS: Current Outpatient Prescriptions  Medication Sig Dispense Refill  . aspirin 81 MG tablet Take 81 mg by mouth daily.       . Calcium Citrate-Vitamin D (CITRACAL + D PO) Take 1,200 mg by mouth daily.      Marland Kitchen diltiazem (CARDIZEM CD) 240 MG 24 hr capsule Take 240 mg by mouth daily.        . fluticasone (FLONASE) 50 MCG/ACT nasal spray Place 2 sprays into the nose daily.  1 g  6  . Multiple Vitamins-Minerals (PRESERVISION AREDS 2 PO) Take 1 capsule by mouth daily.      . pantoprazole (PROTONIX) 40 MG tablet Take 1 tablet (40 mg total) by mouth daily.  90 tablet  1  . Pitavastatin Calcium 2 MG TABS Take 1 tablet by mouth 2 (two) times a week.      .  riboflavin (VITAMIN B-2) 100 MG TABS Take 200 mg by mouth daily.       . TURMERIC PO Take by mouth daily.      . Pitavastatin Calcium 4 MG TABS Take 0.5 tablets (2 mg total) by mouth daily.  14 tablet  0  . telmisartan (MICARDIS) 40 MG tablet Take 1 tablet (40 mg total) by mouth every morning.  90 tablet  1   No current facility-administered medications for this visit.    LABS/IMAGING: No results found for this or any previous visit (from the past 48 hour(s)). No results found.  VITALS: BP 160/80  Pulse 71  Ht 5' (1.524 m)  Wt 146 lb 8 oz (66.452 kg)  BMI 28.61 kg/m2  LMP 05/14/1997  EXAM: General appearance: alert and no distress Neck: no carotid bruit and no JVD Lungs: clear to auscultation bilaterally Heart: regular rate and rhythm, S1, S2 normal, no murmur, click, rub or gallop Abdomen: soft, non-tender; bowel sounds normal; no masses,  no organomegaly Extremities: extremities normal, atraumatic, no cyanosis or edema Pulses: 2+ and symmetric Skin: Skin color, texture, turgor normal. No rashes or lesions Neurologic: Grossly normal Psych: Mood, affect normal  EKG: Sinus rhythm at 71  ASSESSMENT: 1. Palpitations 2. Hypertension-uncontrolled 3. His lipidemia-not at goal  PLAN: 1.   Emily Thompson has fairly well-controlled palpitations however her blood pressure is not at goal. This was seen previously in readings that she brought with her demonstrate that her pressure is higher at home. I would like to start her on additional blood pressure medications today and will add Micardis 40 mg daily.  Have also given her samples of Livalo to help with the cost. I've encouraged her to continue on her current dose of little until she sees you and has a repeat lipid profile. I suspect her LDL will still be greater than 100 and would recommend higher dose Livalo if she could tolerate it. She'll be scheduled with a followup in the hypertension clinic in 2-3 weeks.  Otherwise, I'll plan to  see her back in 6 months.  Emily Casino, MD, [facility] Attending Cardiologist [facility]  Emily Thompson 05/20/2013, 6:10 PM

## 2013-05-26 ENCOUNTER — Telehealth: Payer: Self-pay

## 2013-05-26 NOTE — Telephone Encounter (Signed)
Medication and allergies:  Reviewed and updated  90 day supply/mail order: n/a Local pharmacy:  [facility]   Immunizations due:  UTD   A/P: No changes to personal, family history or past surgical hx PAP-01/09/12- Neg CCS- 09/14/11 MMG- 11/21/12 BD- 03/04/13 Flu- 03/12/13 Tdap-01/08/13 PNA- 02/11/09 Shingles- 03/14/10  To Discuss with Provider: Not at this time.

## 2013-05-27 ENCOUNTER — Ambulatory Visit (INDEPENDENT_AMBULATORY_CARE_PROVIDER_SITE_OTHER): Payer: Medicare HMO | Admitting: Internal Medicine

## 2013-05-27 ENCOUNTER — Encounter: Payer: Self-pay | Admitting: Internal Medicine

## 2013-05-27 VITALS — BP 136/74 | HR 65 | Temp 97.9°F | Ht 60.0 in | Wt 144.0 lb

## 2013-05-27 DIAGNOSIS — Z0000 Encounter for general adult medical examination without abnormal findings: Secondary | ICD-10-CM

## 2013-05-27 DIAGNOSIS — I10 Essential (primary) hypertension: Secondary | ICD-10-CM

## 2013-05-27 DIAGNOSIS — E785 Hyperlipidemia, unspecified: Secondary | ICD-10-CM

## 2013-05-27 DIAGNOSIS — R059 Cough, unspecified: Secondary | ICD-10-CM | POA: Insufficient documentation

## 2013-05-27 DIAGNOSIS — M1990 Unspecified osteoarthritis, unspecified site: Secondary | ICD-10-CM

## 2013-05-27 DIAGNOSIS — R05 Cough: Secondary | ICD-10-CM

## 2013-05-27 DIAGNOSIS — M949 Disorder of cartilage, unspecified: Secondary | ICD-10-CM

## 2013-05-27 DIAGNOSIS — K219 Gastro-esophageal reflux disease without esophagitis: Secondary | ICD-10-CM

## 2013-05-27 DIAGNOSIS — M899 Disorder of bone, unspecified: Secondary | ICD-10-CM

## 2013-05-27 HISTORY — DX: Unspecified osteoarthritis, unspecified site: M19.90

## 2013-05-27 HISTORY — DX: Cough, unspecified: R05.9

## 2013-05-27 LAB — CBC WITH DIFFERENTIAL/PLATELET
Basophils Absolute: 0 10*3/uL (ref 0.0–0.1)
Basophils Relative: 0.4 % (ref 0.0–3.0)
Eosinophils Absolute: 0.3 10*3/uL (ref 0.0–0.7)
Eosinophils Relative: 3.6 % (ref 0.0–5.0)
HCT: 40.2 % (ref 36.0–46.0)
Hemoglobin: 13.7 g/dL (ref 12.0–15.0)
Lymphocytes Relative: 15.7 % (ref 12.0–46.0)
Lymphs Abs: 1.1 10*3/uL (ref 0.7–4.0)
MCHC: 34.1 g/dL (ref 30.0–36.0)
MCV: 90 fl (ref 78.0–100.0)
Monocytes Absolute: 0.7 10*3/uL (ref 0.1–1.0)
Monocytes Relative: 10.5 % (ref 3.0–12.0)
Neutro Abs: 4.9 10*3/uL (ref 1.4–7.7)
Neutrophils Relative %: 69.8 % (ref 43.0–77.0)
Platelets: 216 10*3/uL (ref 150.0–400.0)
RBC: 4.46 Mil/uL (ref 3.87–5.11)
RDW: 13 % (ref 11.5–14.6)
WBC: 7 10*3/uL (ref 4.5–10.5)

## 2013-05-27 LAB — TSH: TSH: 1.32 u[IU]/mL (ref 0.35–5.50)

## 2013-05-27 LAB — LIPID PANEL
Cholesterol: 200 mg/dL (ref 0–200)
HDL: 67.8 mg/dL (ref >39.00)
LDL Cholesterol: 120 mg/dL — ABNORMAL HIGH (ref 0–99)
Total CHOL/HDL Ratio: 3
Triglycerides: 62 mg/dL (ref 0.0–149.0)
VLDL: 12.4 mg/dL (ref 0.0–40.0)

## 2013-05-27 LAB — ALT: ALT: 16 U/L (ref 0–35)

## 2013-05-27 LAB — AST: AST: 21 U/L (ref 0–37)

## 2013-05-27 MED ORDER — HYDROCODONE-HOMATROPINE 5-1.5 MG/5ML PO SYRP: 5.0000 mL | ORAL_SOLUTION | Freq: Three times a day (TID) | ORAL | Status: DC | PRN

## 2013-05-27 NOTE — Progress Notes (Signed)
Pre visit review using our clinic review tool, if applicable. No additional management support is needed unless otherwise documented below in the visit note. 

## 2013-05-27 NOTE — Assessment & Plan Note (Signed)
Recently started on Micardis  by cardiology, plans to followup with them in a couple of weeks

## 2013-05-27 NOTE — Assessment & Plan Note (Signed)
See history of present illness, mild back pain, doing great with using shoe inserts and exercise

## 2013-05-27 NOTE — Assessment & Plan Note (Signed)
Was taking livalo 3 times a week and decrease to 2 times a week due to cost, plan is to check FLP, provide samples of livalo; she is okay going back on livalo 3-times a week if needed. She has some aches and pains, suggested to hold statin x 3 weeks  if so desired but she is not convinded sx  related to cholesterol medication

## 2013-05-27 NOTE — Assessment & Plan Note (Signed)
[place] has persisting cough, uses Hycodan when necessary, prescription provided.

## 2013-05-27 NOTE — Progress Notes (Signed)
Subjective:    Patient ID: Emily Thompson, female    DOB: [birth year], 68 y.o.   MRN: [id number]  HPI  Here for Medicare AWV: 1. Risk factors based on Past M, S, F history: reviewed 2. Physical Activities: active, occ goes to the gym, tai chi  3. Depression/mood:  Neg screening   4. Hearing:  Has hearing aids, has regular checks 5. ADL's:  Independent, still drives   6. Fall Risk: no recent falls, counseled 7. home Safety: does feel safe at home   8. Height, weight, &visual acuity: see VS, uses glasses , sees eye doctor regularly 9. Counseling: provided 10. Labs ordered based on risk factors: if needed   11. Referral Coordination: if needed 12.  Care Plan, see assessment and plan   13.   Cognitive Assessment: cognition and motor skills wnl  In addition, today we discussed the following:  HTN-- recently saw cardiology and started Micardis, so far no side effects. 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 Cough--needs a refill hydrocodone, uses it in the spring. High cholesterol--was tolerating livalo 3 times a week but now is taking it 2 times a week because cost is an issue; occasional aches and pains, not sure if related to cholesterol medication.  Past Medical History  Diagnosis Date  . GERD (gastroesophageal reflux disease)   . Osteopenia   . Breast cancer 2003    chemo, radiation, lumpectomy, reconstructive surgery   . SVT (supraventricular tachycardia)     used to see Dr Rex Kras   . Dyslipidemia   . Hearing aid worn   . Hypertension   . Melanoma 2012    righ side face; basal cell (nose)   . BCC (basal cell carcinoma of skin) 2013   Past Surgical History  Procedure Laterality  Date  . Breast lumpectomy Left 07/2001  . Breast reconstruction  2005  . Tubal ligation  1978  . Vulva /perineum biopsy      PAPILLOMA  . Cesarean section  1977  . Tonsillectomy and adenoidectomy  1952  . Nose surgery      basal cell carcinoma removed  . Basal cell carcinoma excision    . Skin cancer excision      melanoma  . Colonoscopy w/ polypectomy  2013    NEXT DUE IN 5 YRS  . Transthoracic echocardiogram  12/2005    EF 50-55%; mild MR; trace TR  . Nm myocar perf wall motion  01/2006    bruce myoview; no evidence of inducible ischemia, anterior wall thinning without evidence of ischemia; post-stress EF 88%; low risk scan    History   Social History  . Marital Status: Widowed    Spouse Name: N/A    Number of Children: 2  . Years of Education: BSN   Occupational History  . 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 Smoking status: Former Smoker -- 10 years    Types: Cigarettes    Quit date: 05/20/1973  . Smokeless tobacco: Never Used  . Alcohol Use: 2.4 oz/week    2 Glasses of wine, 2 Cans of beer per week  Comment: 4 A WEEK  . Drug Use: No  . Sexual Activity: Yes    Birth Control/ Protection: Post-menopausal   Other Topics Concern  . Not on file   Social History Narrative   Widow, 1 adopted and 1 natural child   Family History  Problem Relation Age of Onset  . Hypertension Mother   . Hypertension Father   . Stroke Father     hemorrhagic CVA at age 58  . Colon cancer Maternal Aunt   . Diabetes Maternal Grandmother     thyroid disease  . Breast cancer Neg Hx   . Bipolar disorder Brother   . Thyroid disease Mother     M and B (?)     Review of Systems No  CP, SOB, lower extremity edema Denies  nausea, vomiting diarrhea Denies  blood in the stools No dysphagia or odynophagia (-) cough, sputum production No dysuria, gross hematuria, difficulty urinating       Objective:   Physical Exam BP 136/74  Pulse 65   Temp(Src) 97.9 F (36.6 C)  Ht 5' (1.524 m)  Wt 144 lb (65.318 kg)  BMI 28.12 kg/m2  SpO2 98%  LMP 05/14/1997 General -- alert, well-developed, NAD.  Neck --no thyromegaly , normal carotid pulse Lungs -- normal respiratory effort, no intercostal retractions, no accessory muscle use, and normal breath sounds.  Heart-- normal rate, regular rhythm, no murmur.  Abdomen-- Not distended, good bowel sounds,soft, non-tender. Extremities-- no pretibial edema bilaterally  Neurologic--  alert & oriented X3. Speech normal, gait normal, strength normal in all extremities.  Psych-- Cognition and judgment appear intact. Cooperative with normal attention span and concentration. No anxious or depressed appearing.     Assessment & Plan:

## 2013-05-27 NOTE — Assessment & Plan Note (Addendum)
Td ~2014 Pneumonia shot -- at age 68 Had a Flu shot  Shingles shot-- completed   cscope-- had one ~ 10 years ago, Dr Collene Mares , again 09-2011 , next 5 years MMG 11-2012  (-) Female care per gyn Doing great w/ diet-exercise

## 2013-05-27 NOTE — Assessment & Plan Note (Signed)
Well controlled on meds 

## 2013-05-27 NOTE — Assessment & Plan Note (Addendum)
DEX per gyn 02-2013, T score - 2, on ca and Vit D . Check vitamin D level

## 2013-05-27 NOTE — Patient Instructions (Signed)
Get your blood work before you leave   Next visit is for routine check up regards   hypertension, cholesterol  in 6 months, fasting Please make an appointment     Fall Prevention and [place] cause injuries and can affect all age groups. It is possible to use preventive measures to significantly decrease the likelihood of falls. There are many simple measures which can make your home safer and prevent falls. OUTDOORS  Repair cracks and edges of walkways and driveways.  Remove high doorway thresholds.  Trim shrubbery on the main path into your home.  Have good outside lighting.  Clear walkways of tools, rocks, debris, and clutter.  Check that handrails are not broken and are securely fastened. Both sides of steps should have handrails.  Have leaves, snow, and ice cleared regularly.  Use sand or salt on walkways during winter months.  In the garage, clean up grease or oil spills. BATHROOM  Install night lights.  Install grab bars by the toilet and in the tub and shower.  Use non-skid mats or decals in the tub or shower.  Place a plastic non-slip stool in the shower to sit on, if needed.  Keep floors dry and clean up all water on the floor immediately.  Remove soap buildup in the tub or shower on a regular basis.  Secure bath mats with non-slip, double-sided rug tape.  Remove throw rugs and tripping hazards from the floors. BEDROOMS  Install night lights.  Make sure a bedside light is easy to reach.  Do not use oversized bedding.  Keep a telephone by your bedside.  Have a firm chair with side arms to use for getting dressed.  Remove throw rugs and tripping hazards from the floor. KITCHEN  Keep handles on pots and pans turned toward the center of the stove. Use back burners when possible.  Clean up spills quickly and allow time for drying.  Avoid walking on wet floors.  Avoid hot utensils and knives.  Position shelves so they are not too high or  low.  Place commonly used objects within easy reach.  If necessary, use a sturdy step stool with a grab bar when reaching.  Keep electrical cables out of the way.  Do not use floor polish or wax that makes floors slippery. If you must use wax, use non-skid floor wax.  Remove throw rugs and tripping hazards from the floor. STAIRWAYS  Never leave objects on stairs.  Place handrails on both sides of stairways and use them. Fix any loose handrails. Make sure handrails on both sides of the stairways are as long as the stairs.  Check carpeting to make sure it is firmly attached along stairs. Make repairs to worn or loose carpet promptly.  Avoid placing throw rugs at the top or bottom of stairways, or properly secure the rug with carpet tape to prevent slippage. Get rid of throw rugs, if possible.  Have an electrician put in a light switch at the top and bottom of the stairs. OTHER FALL PREVENTION TIPS  Wear low-heel or rubber-soled shoes that are supportive and fit well. Wear closed toe shoes.  When using a stepladder, make sure it is fully opened and both spreaders are firmly locked. Do not climb a closed stepladder.  Add color or contrast paint or tape to grab bars and handrails in your home. Place contrasting color strips on first and last steps.  Learn and use mobility aids as needed. Install an electrical emergency response  system.  Turn on lights to avoid dark areas. Replace light bulbs that burn out immediately. Get light switches that glow.  Arrange furniture to create clear pathways. Keep furniture in the same place.  Firmly attach carpet with non-skid or double-sided tape.  Eliminate uneven floor surfaces.  Select a carpet pattern that does not visually hide the edge of steps.  Be aware of all pets. OTHER HOME SAFETY TIPS  Set the water temperature for 120 F (48.8 C).  Keep emergency numbers on or near the telephone.  Keep smoke detectors on every level of the  home and near sleeping areas. Document Released: 04/20/2002 Document Revised: 10/30/2011 Document Reviewed: 07/20/2011 Emily Thompson - [facility] Patient Information 2014 [place].

## 2013-05-28 LAB — VITAMIN D 25 HYDROXY (VIT D DEFICIENCY, FRACTURES): Vit D, 25-Hydroxy: 50 ng/mL (ref 30–89)

## 2013-06-12 ENCOUNTER — Ambulatory Visit: Payer: Medicare Other

## 2013-06-15 ENCOUNTER — Ambulatory Visit (INDEPENDENT_AMBULATORY_CARE_PROVIDER_SITE_OTHER): Payer: Medicare HMO | Admitting: Pharmacist Clinician (PhC)/ Clinical Pharmacy Specialist

## 2013-06-15 ENCOUNTER — Encounter: Payer: Self-pay | Admitting: Pharmacist Clinician (PhC)/ Clinical Pharmacy Specialist

## 2013-06-15 VITALS — BP 136/78 | HR 64 | Ht 60.0 in | Wt 151.2 lb

## 2013-06-15 DIAGNOSIS — I10 Essential (primary) hypertension: Secondary | ICD-10-CM

## 2013-06-15 NOTE — Progress Notes (Signed)
06/15/2013 [name] February 29, 1946 [id number]   HPI:  Emily Thompson is a 68 y.o. female patient of Dr Debara Pickett, with a PMH below who presents today for a blood pressure check.  She saw Dr. Debara Pickett in early January and had a BP of 160/80.  At that time she was only taking diltiazem 240mg  qd and he started her on Micardis 40mg  qd.   She reports no problems with the Micardis.  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  She states most of the readings are in the 413-244W systolic.  She can't recall then diastolic readings other than they are WNL.  She does not use any tobacco products.  She drinks red wine 3-4 times per week and a small amount of coffee daily.  She exercises regularly at the gym, both cardio and strength training.  She does use some salt when cooking, but very little at the table.  Her mother also suffered from hypertension.   She saw Dr. Larose Kells about a week after seeing Dr. Debara Pickett and her BP was 136/74.     Current Outpatient Prescriptions  Medication Sig Dispense Refill  . aspirin 81 MG tablet Take 81 mg by mouth daily.       . Calcium Citrate-Vitamin D (CITRACAL + D PO) Take 1,200 mg by mouth daily.      Marland Kitchen diltiazem (CARDIZEM CD) 240 MG 24 hr capsule Take 240 mg by mouth daily.        . fluticasone (FLONASE) 50 MCG/ACT nasal spray Place 2 sprays into the nose daily.  1 g  6  . Multiple Vitamins-Minerals (PRESERVISION AREDS 2 PO) Take 1 capsule by mouth daily.      . pantoprazole (PROTONIX) 40 MG tablet Take 1 tablet (40 mg total) by mouth daily.  90 tablet  1  . Pitavastatin Calcium 2 MG TABS Take 1 tablet by mouth 2 (two) times a week.      . riboflavin (VITAMIN B-2) 100 MG TABS Take 200 mg by mouth daily.       Marland Kitchen telmisartan (MICARDIS) 40 MG tablet Take 1 tablet (40 mg total) by mouth every morning.  90 tablet  1  . TURMERIC PO Take by mouth daily.       No current facility-administered medications for this visit.    Allergies  Allergen Reactions  .  Dextromethorphan   . Statins     Headache, hot flashes  . Tape   . Morphine And Related     Past Medical History  Diagnosis Date  . GERD (gastroesophageal reflux disease)   . Osteopenia   . Breast cancer 2003    chemo, radiation, lumpectomy, reconstructive surgery   . SVT (supraventricular tachycardia)     used to see Dr Rex Kras   . Dyslipidemia   . Hearing aid worn   . Hypertension   . Melanoma 2012    righ side face; basal cell (nose)   . BCC (basal cell carcinoma of skin) 2013    Blood pressure 136/78, pulse 64, height 5' (1.524 m), weight 151 lb 3.2 oz (68.584 kg), last menstrual period 05/14/1997.   ASSESSMENT AND PLAN:  Ms. Boylan BP is within guidelines today, and I am not going to make any changes at this time.  I have encouraged her to stay active and continue with sodium restrictions.  She will continue to take her home BP several times per week and keep track of where  the numbers are trending.  I have asked that she call us if she sees the pressures rise to consistently in the 150s or higher, and also that she take her BP cuff to her next medical visit to have them check it for accuracy.    Tommy Medal PharmD CPP [facility]

## 2013-06-15 NOTE — Patient Instructions (Signed)
Your blood pressure today is good at 136/78  Check your blood pressure at home daily (if able) and keep record of the readings.  Bring all of your meds, your BP cuff and your record of home blood pressures to your next appointment.  Exercise as you're able, try to walk approximately 30 minutes per day.  Keep salt intake to a minimum, especially watch canned and prepared boxed foods.  Eat more fresh fruits and vegetables and fewer canned items.  Avoid eating in fast food restaurants.    HOW TO TAKE YOUR BLOOD PRESSURE:   Rest 5 minutes before taking your blood pressure.    Don't smoke or drink caffeinated beverages for at least 30 minutes before.   Take your blood pressure before (not after) you eat.   Sit comfortably with your back supported and both feet on the floor (don't cross your legs).   Elevate your arm to heart level on a table or a desk.   Use the proper sized cuff. It should fit smoothly and snugly around your bare upper arm. There should be enough room to slip a fingertip under the cuff. The bottom edge of the cuff should be 1 inch above the crease of the elbow.   Ideally, take 3 measurements at one sitting and record the average.

## 2013-06-17 ENCOUNTER — Telehealth: Payer: Self-pay | Admitting: Internal Medicine

## 2013-06-17 NOTE — Telephone Encounter (Signed)
Relevant patient education assigned to patient using Emmi. ° °

## 2013-07-13 ENCOUNTER — Telehealth: Payer: Self-pay | Admitting: Internal Medicine

## 2013-07-13 MED ORDER — DILTIAZEM HCL ER COATED BEADS 240 MG PO CP24: 240.0000 mg | ORAL_CAPSULE | Freq: Every day | ORAL | Status: DC

## 2013-07-13 NOTE — Telephone Encounter (Signed)
Refills sent to pharmacy. 

## 2013-07-13 NOTE — Telephone Encounter (Signed)
Need new prescription called in,she have changed insurance company. Please call her Cartia XT 240 mg #90 and refills.Please call to [phone]

## 2013-07-28 ENCOUNTER — Encounter: Payer: Self-pay | Admitting: Internal Medicine

## 2013-10-08 ENCOUNTER — Other Ambulatory Visit: Payer: Self-pay | Admitting: Internal Medicine

## 2013-10-08 NOTE — Telephone Encounter (Signed)
Rx was sent to pharmacy electronically. 

## 2013-10-22 ENCOUNTER — Other Ambulatory Visit: Payer: Self-pay | Admitting: Dermatology

## 2013-11-25 ENCOUNTER — Ambulatory Visit (INDEPENDENT_AMBULATORY_CARE_PROVIDER_SITE_OTHER): Payer: Medicare HMO | Admitting: Internal Medicine

## 2013-11-25 ENCOUNTER — Encounter: Payer: Self-pay | Admitting: Internal Medicine

## 2013-11-25 VITALS — BP 131/69 | HR 63 | Temp 98.2°F | Wt 147.0 lb

## 2013-11-25 DIAGNOSIS — E785 Hyperlipidemia, unspecified: Secondary | ICD-10-CM

## 2013-11-25 DIAGNOSIS — I10 Essential (primary) hypertension: Secondary | ICD-10-CM

## 2013-11-25 DIAGNOSIS — K219 Gastro-esophageal reflux disease without esophagitis: Secondary | ICD-10-CM

## 2013-11-25 LAB — BASIC METABOLIC PANEL
BUN: 16 mg/dL (ref 6–23)
CO2: 30 mEq/L (ref 19–32)
Calcium: 9.5 mg/dL (ref 8.4–10.5)
Chloride: 104 mEq/L (ref 96–112)
Creatinine, Ser: 0.7 mg/dL (ref 0.4–1.2)
GFR: 83.78 mL/min (ref >60.00)
Glucose, Bld: 82 mg/dL (ref 70–99)
Potassium: 4 mEq/L (ref 3.5–5.1)
Sodium: 140 mEq/L (ref 135–145)

## 2013-11-25 LAB — LIPID PANEL
Cholesterol: 239 mg/dL — ABNORMAL HIGH (ref 0–200)
HDL: 69.6 mg/dL (ref >39.00)
LDL Cholesterol: 149 mg/dL — ABNORMAL HIGH (ref 0–99)
NonHDL: 169.4
Total CHOL/HDL Ratio: 3
Triglycerides: 102 mg/dL (ref 0.0–149.0)
VLDL: 20.4 mg/dL (ref 0.0–40.0)

## 2013-11-25 MED ORDER — RANITIDINE HCL 300 MG PO TABS: 300.0000 mg | ORAL_TABLET | Freq: Every day | ORAL | Status: DC

## 2013-11-25 NOTE — Progress Notes (Signed)
Pre visit review using our clinic review tool, if applicable. No additional management support is needed unless otherwise documented below in the visit note. 

## 2013-11-25 NOTE — Assessment & Plan Note (Addendum)
Took livalo  for a while, developed  arm and leg pain and moderate generalize weakness, self discontinued it few months ago and feels much better. Likes to take OTC medication cholest-off, We also discussed zetia Plan: FLP today for baseline To take the  OTC medication and come back fasting in 6 months for an other FLP, consider zetia at some point

## 2013-11-25 NOTE — Patient Instructions (Addendum)
Get your blood work before you leave   Next visit is for a physical exam by 05-2014,  fasting Please make an appointment

## 2013-11-25 NOTE — Assessment & Plan Note (Signed)
Likes to minimize the use of PPIs. Plan: Zantac at night, decrease Protonix to half tablet at breakfast

## 2013-11-25 NOTE — Assessment & Plan Note (Addendum)
BP controlled, occasionally sees a low diastolic BP and hear rate with minimal symptoms, discussed  about possibly decrease medications but states tha low BPs are rare and does not like to do that

## 2013-11-25 NOTE — Progress Notes (Signed)
Subjective:    Patient ID: Emily Thompson, female    DOB: [birth year], 68 y.o.   MRN: [id number]  DOS:  11/25/2013 Type of visit - description: ROV History: HTN, good medication compliance, occasionally low BPs and HR. Cholesterol, intolerant tolivalo, see assessment and plan Skin cancer, had other moh's  surgery on the nose, closely follow up by dermatology GERD, would like to stop or minimize use of PPIs.  ROS Denies chest pain or difficulty breathing. No lower extremity edema Denies dysphasia or odynophagia, GERD symptoms well controlled as long as she takes PPIs qd   Past Medical History  Diagnosis Date  . GERD (gastroesophageal reflux disease)   . Osteopenia   . Breast cancer 2003    chemo, radiation, lumpectomy, reconstructive surgery   . SVT (supraventricular tachycardia)     used to see Dr Rex Kras   . Dyslipidemia   . Hearing aid worn   . Hypertension   . Melanoma 2012    righ side face; basal cell (nose)   . BCC (basal cell carcinoma of skin) 2013    Past Surgical History  Procedure Laterality Date  . Breast lumpectomy Left 07/2001  . Breast reconstruction  2005  . Tubal ligation  1978  . Vulva /perineum biopsy      PAPILLOMA  . Cesarean section  1977  . Tonsillectomy and adenoidectomy  1952  . Nose surgery      basal cell carcinoma removed  . Basal cell carcinoma excision    . Skin cancer excision      melanoma  . Colonoscopy w/ polypectomy  2013    NEXT DUE IN 5 YRS  . Transthoracic echocardiogram  12/2005    EF 50-55%; mild MR; trace TR  . Nm myocar perf wall motion  01/2006    bruce myoview; no evidence of inducible ischemia, anterior wall thinning without evidence of ischemia; post-stress EF 88%; low risk scan     History   Social History  . Marital Status: Widowed    Spouse Name: N/A    Number of Children: 2  . Years of Education: BSN   Occupational History  . 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  Smoking status: Former Smoker -- 10 years    Types: Cigarettes    Quit date: 05/20/1973  . Smokeless tobacco: Never Used  . Alcohol Use: 2.4 oz/week    2 Glasses of wine, 2 Cans of beer per week     Comment: 4 A WEEK  . Drug Use: No  . Sexual Activity: Yes    Birth Control/ Protection: Post-menopausal   Other Topics Concern  . Not on file   Social History Narrative   Widow, 1 adopted and 1 natural child        Medication List       This list is accurate as of: 11/25/13  2:00 PM.  Always use your most recent med list.               aspirin 81 MG tablet  Take 81 mg by mouth daily.     CITRACAL + D PO  Take 1,200 mg by mouth daily.     diltiazem 240 MG 24 hr capsule  Commonly known as:  CARDIZEM CD  Take 1 capsule (240 mg total) by mouth daily.     fluticasone 50 MCG/ACT nasal spray  Commonly known as:  FLONASE  Place 2 sprays  into the nose daily.     pantoprazole 40 MG tablet  Commonly known as:  PROTONIX  Take 1 tablet (40 mg total) by mouth daily.     PRESERVISION AREDS 2 PO  Take 1 capsule by mouth daily.     ranitidine 300 MG tablet  Commonly known as:  ZANTAC  Take 1 tablet (300 mg total) by mouth at bedtime.     riboflavin 100 MG Tabs tablet  Commonly known as:  VITAMIN B-2  Take 200 mg by mouth daily.     telmisartan 40 MG tablet  Commonly known as:  MICARDIS  TAKE 1 TABLET BY MOUTH EVERY MORNING.     TURMERIC PO  Take by mouth daily.           Objective:   Physical Exam BP 131/69  Pulse 63  Temp(Src) 98.2 F (36.8 C)  Wt 147 lb (66.679 kg)  SpO2 96%  LMP 05/14/1997  General -- alert, well-developed, NAD.   Lungs -- normal respiratory effort, no intercostal retractions, no accessory muscle use, and normal breath sounds.  Heart-- normal rate, regular rhythm, no murmur.  Extremities-- no pretibial edema bilaterally  Neurologic--  alert & oriented X3. Speech normal, gait appropriate for age, strength symmetric and appropriate for  age.  Psych-- Cognition and judgment appear intact. Cooperative with normal attention span and concentration. No anxious or depressed appearing.         Assessment & Plan:

## 2013-11-27 ENCOUNTER — Encounter: Payer: Self-pay | Admitting: Women's Health

## 2013-12-23 ENCOUNTER — Ambulatory Visit (INDEPENDENT_AMBULATORY_CARE_PROVIDER_SITE_OTHER): Payer: Medicare HMO | Admitting: Internal Medicine

## 2013-12-23 ENCOUNTER — Encounter: Payer: Self-pay | Admitting: Internal Medicine

## 2013-12-23 VITALS — BP 162/84 | HR 66 | Ht 60.0 in | Wt 146.2 lb

## 2013-12-23 DIAGNOSIS — R002 Palpitations: Secondary | ICD-10-CM

## 2013-12-23 DIAGNOSIS — I10 Essential (primary) hypertension: Secondary | ICD-10-CM

## 2013-12-23 DIAGNOSIS — E785 Hyperlipidemia, unspecified: Secondary | ICD-10-CM

## 2013-12-23 MED ORDER — EZETIMIBE 10 MG PO TABS: 10.0000 mg | ORAL_TABLET | Freq: Every day | ORAL | Status: DC

## 2013-12-23 NOTE — Patient Instructions (Signed)
Your physician has recommended you make the following change in your medication: START zetia 10mg  once daily.   Your physician wants you to follow-up in: 6 months. You will receive a reminder letter in the mail two months in advance. If you don't receive a letter, please call our office to schedule the follow-up appointment.

## 2013-12-23 NOTE — Progress Notes (Addendum)
OFFICE NOTE  Chief Complaint:  follow-up  Primary Care Physician: Emily November, MD  HPI:  Emily Thompson  is a 68 year old female previously followed by Emily Thompson with history of hypertension, palpitations and dyslipidemia. She has had these palpitations almost her whole life and they are fairly infrequent now that she is on diltiazem. This has also helped her with her hypertension. However, recently she noted her blood pressures have been creeping up slightly. She has also had problems with dyslipidemia including a very high LDL which is probably genetic in nature. Recently, her LDL off of a statin was 171 on May 13, 2012. The total cholesterol was 256, triglycerides 92, HDL of 69. Despite the high HDL, she is at much higher risk and I suspect she has a high LDL particle number. Those tests have not yet been performed. Nevertheless, she is now trying Livalo 2 mg 3 times weekly and hopefully she will be more tolerant of this. Of course, recommended retesting in 2 to 3 months and consideration for lipid NMR test should be given.  Today she brings blood pressure readings which do indicate a higher than normal blood pressure. Her blood pressure in the office was 160/80. She continues only to take a little low twice weekly, mostly due to cost and not necessarily side effects.  Emily Thompson returns today for followup. She recently saw her primary care provider who's been working with her and her cholesterol. Unfortunately she's failed 5 different statin medications due to significant myalgias. She was most recently on Livalo, but also felt that this caused her significant myalgias. She has never taken Zetia in the past and this may be an option although it will not get her to her goal cholesterol. She is willing to try this. I also Dr. about possibly starting on Pralulent. This is the first FDA approved PCSK9 inhibitor.  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 There is a low risk of side effects. There is reported decrease in LDL cholesterol between 45 and 55% depending on the dose.  PMHx:  Past Medical History  Diagnosis Date  . GERD (gastroesophageal reflux disease)   . Osteopenia   . Breast cancer 2003    chemo, radiation, lumpectomy, reconstructive surgery   . SVT (supraventricular tachycardia)     used to see Dr Rex Thompson   . Dyslipidemia   . Hearing aid worn   . Hypertension   . Melanoma 2012    righ side face; basal cell (nose)   . BCC (basal cell carcinoma of skin) 2013    Past Surgical History  Procedure Laterality Date  . Breast lumpectomy Left 07/2001  . Breast reconstruction  2005  . Tubal ligation  1978  . Vulva /perineum biopsy      PAPILLOMA  . Cesarean section  1977  . Tonsillectomy and adenoidectomy  1952  . Nose surgery      basal cell carcinoma removed  . Basal cell carcinoma excision    . Skin cancer excision      melanoma  . Colonoscopy w/ polypectomy  2013    NEXT DUE IN 5 YRS  . Transthoracic echocardiogram  12/2005    EF 50-55%; mild MR; trace TR  . Nm myocar perf wall motion  01/2006    bruce myoview; no evidence of inducible ischemia, anterior wall thinning without evidence of  ischemia; post-stress EF 88%; low risk scan     FAMHx:  Family History  Problem Relation Age of Onset  . Hypertension Mother   . Hypertension Father   . Stroke Father     hemorrhagic CVA at age 67  . Colon cancer Maternal Aunt   . Diabetes Brother   . Breast cancer Neg Hx   . Bipolar disorder Brother   . Thyroid disease Mother     M and B (?)    SOCHx:   reports that she quit smoking about 40 years ago. Her smoking use included Cigarettes. She smoked 0.00 packs per day for 10 years. She has never used smokeless tobacco. She reports that she drinks about 2.4 ounces of alcohol per week. She reports that she does not use illicit  drugs.  ALLERGIES:  Allergies  Allergen Reactions  . Dextromethorphan   . Statins     Headache, hot flashes  . Tape   . Morphine And Related     ROS: A comprehensive review of systems was negative.  HOME MEDS: Current Outpatient Prescriptions  Medication Sig Dispense Refill  . aspirin 81 MG tablet Take 81 mg by mouth daily.       . Calcium Citrate-Vitamin D (CITRACAL + D PO) Take 1,200 mg by mouth daily.      Marland Kitchen diltiazem (CARDIZEM CD) 240 MG 24 hr capsule Take 1 capsule (240 mg total) by mouth daily.  90 capsule  2  . Multiple Vitamins-Minerals (PRESERVISION AREDS 2 PO) Take 1 capsule by mouth daily.      Marland Kitchen OVER THE COUNTER MEDICATION Take 2 capsules by mouth 2 (two) times daily with a meal. Cholest-Off      . pantoprazole (PROTONIX) 40 MG tablet Take 20 mg by mouth daily.      . ranitidine (ZANTAC) 300 MG tablet Take 1 tablet (300 mg total) by mouth at bedtime.  90 tablet  3  . riboflavin (VITAMIN B-2) 100 MG TABS Take 200 mg by mouth daily.       Marland Kitchen telmisartan (MICARDIS) 40 MG tablet TAKE 1 TABLET BY MOUTH EVERY MORNING.  90 tablet  1  . TURMERIC PO Take by mouth daily.      Marland Kitchen ezetimibe (ZETIA) 10 MG tablet Take 1 tablet (10 mg total) by mouth daily.  30 tablet  6  . fluticasone (FLONASE) 50 MCG/ACT nasal spray Place 2 sprays into the nose daily.  1 g  6   No current facility-administered medications for this visit.    LABS/IMAGING: No results found for this or any previous visit (from the past 48 hour(s)). No results found.  VITALS: BP 162/84  Pulse 66  Ht 5' (1.524 m)  Wt 146 lb 3.2 oz (66.316 kg)  BMI 28.55 kg/m2  LMP 05/14/1997  EXAM: General appearance: alert and no distress Neck: no carotid bruit and no JVD Lungs: clear to auscultation bilaterally Heart: regular rate and rhythm, S1, S2 normal, no murmur, click, rub or gallop Abdomen: soft, non-tender; bowel sounds normal; no masses,  no organomegaly Extremities: extremities normal, atraumatic, no cyanosis  or edema Pulses: 2+ and symmetric Skin: Skin color, texture, turgor normal. No rashes or lesions Neurologic: Grossly normal Psych: Mood, affect normal  EKG: Sinus rhythm at 66  ASSESSMENT: 1. Palpitations - improved 2. Hypertension- improved based on home readings 3. Dyslipidemia-not at goal (intolerant to 5 statins in the past)  PLAN: 1.   Ms. Chason has had an improvement in her palpitations and  marked improvement in her blood pressure. Her home readings appear to be at goal. Her cholesterol unfortunately remains high and she's been intolerant of 5 different statins in the past. I would recommend starting Zetia today we provided her with samples and a prescription. In addition she may be a good candidate for Praluent. The only issues is that she does not have documented CAD or atherosclerosis, which is a requirement for the medication.  Therefore, we'll start with the zetia for now. I may wish to consider a carotid IMT scan to try to detect early atherosclerosis.  Pixie Casino, MD, [facility] Attending Cardiologist [facility]  Nico Syme C 12/23/2013, 5:34 PM

## 2014-01-27 ENCOUNTER — Encounter: Payer: Self-pay | Admitting: Women's Health

## 2014-01-27 ENCOUNTER — Ambulatory Visit (INDEPENDENT_AMBULATORY_CARE_PROVIDER_SITE_OTHER): Payer: Medicare HMO | Admitting: Women's Health

## 2014-01-27 VITALS — BP 140/80 | Ht 60.0 in | Wt 144.0 lb

## 2014-01-27 DIAGNOSIS — M949 Disorder of cartilage, unspecified: Secondary | ICD-10-CM

## 2014-01-27 DIAGNOSIS — R21 Rash and other nonspecific skin eruption: Secondary | ICD-10-CM

## 2014-01-27 DIAGNOSIS — M899 Disorder of bone, unspecified: Secondary | ICD-10-CM

## 2014-01-27 DIAGNOSIS — M8580 Other specified disorders of bone density and structure, unspecified site: Secondary | ICD-10-CM

## 2014-01-27 MED ORDER — BETAMETHASONE DIPROPIONATE 0.05 % EX CREA: TOPICAL_CREAM | Freq: Two times a day (BID) | CUTANEOUS | Status: DC

## 2014-01-27 NOTE — Progress Notes (Addendum)
Emily Thompson 09/14/1945 [id number]    History:    Presents for breast and pelvic exam.  2003 left breast cancer, 2005 reconstruction with normal mammograms after. BRCA unknown. Normal Pap history. 2014 DEXA -2 femoral neck, has increased exercise, did not tolerate Fosamax or Boniva well and does not want to take. GERD/hypertension/hypercholesterolemia/palpitations-primary care manages . Benign colon polyp 2014. Right face melanoma has annual skin checks.  Past medical history, past surgical history, family history and social history were all reviewed and documented in the EPIC chart. Retired Therapist, sports. 2 daughters. Hard of hearing uses hearing aids.  ROS:  A  12 point ROS was performed and pertinent positives and negatives are included.  Exam:  Filed Vitals:   01/27/14 1408  BP: 140/80    General appearance:  Normal Thyroid:  Symmetrical, normal in size, without palpable masses or nodularity. Respiratory  Auscultation:  Clear without wheezing or rhonchi Cardiovascular  Auscultation:  Regular rate, without rubs, murmurs or gallops  Edema/varicosities:  Not grossly evident Abdominal  Soft,nontender, without masses, guarding or rebound.  Liver/spleen:  No organomegaly noted  Hernia:  None appreciated  Skin  Inspection:  Grossly normal   Breasts: Examined lying and sitting.     Right: Reduction/lift Without masses, retractions, discharge or axillary adenopathy.     Left: large lumpectomy with implant/reconstruction Gentitourinary   Inguinal/mons:  Normal without inguinal adenopathy  External genitalia:  Normal  BUS/Urethra/Skene's glands:  Normal  Vagina:  Atrophic  Cervix:  Normal  Uterus:   normal in size, shape and contour.  Midline and mobile  Adnexa/parametria:     Rt: Without masses or tenderness.   Lt: Without masses or tenderness.  Anus and perineum: Normal  Digital rectal exam: Normal sphincter tone without palpated masses or tenderness  Assessment/Plan:  68 y.o.  [facility] G2P2 for breast and pelvic exam.  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 Will have a vitamin D level checked with next labs at primary care. Home safety, fall prevention discussed importance of regular exercise. Continue annual skin checks. Pap normal 2013, new screening guidelines reviewed.   Note: This dictation was prepared with Dragon/digital dictation.  Any transcriptional errors that result are unintentional. [name], 2:51 PM 01/27/2014

## 2014-01-27 NOTE — Patient Instructions (Signed)
Health Recommendations for Postmenopausal Women Respected and ongoing research has looked at the most common causes of death, disability, and poor quality of life in postmenopausal women. The causes include heart disease, diseases of blood vessels, diabetes, depression, cancer, and bone loss (osteoporosis). Many things can be done to help lower the chances of developing these and other common problems. CARDIOVASCULAR DISEASE Heart Disease: A heart attack is a medical emergency. Know the signs and symptoms of a heart attack. Below are things women can do to reduce their risk for heart disease.   Do not smoke. If you smoke, quit.  Aim for a healthy weight. Being overweight causes many preventable deaths. Eat a healthy and balanced diet and drink an adequate amount of liquids.  Get moving. Make a commitment to be more physically active. Aim for 30 minutes of activity on most, if not all days of the week.  Eat for heart health. Choose a diet that is low in saturated fat and cholesterol and eliminate trans fat. Include whole grains, vegetables, and fruits. Read and understand the labels on food containers before buying.  Know your numbers. Ask your caregiver to check your blood pressure, cholesterol (total, HDL, LDL, triglycerides) and blood glucose. Work with your caregiver on improving your entire clinical picture.  High blood pressure. Limit or stop your table salt intake (try salt substitute and food seasonings). Avoid salty foods and drinks. Read labels on food containers before buying. Eating well and exercising can help control high blood pressure. STROKE  Stroke is a medical emergency. Stroke may be the result of a blood clot in a blood vessel in the brain or by a brain hemorrhage (bleeding). Know the signs and symptoms of a stroke. To lower the risk of developing a stroke:  Avoid fatty foods.  Quit smoking.  Control your diabetes, blood pressure, and irregular heart rate. THROMBOPHLEBITIS  (BLOOD CLOT) OF THE LEG  Becoming overweight and leading a stationary lifestyle may also contribute to developing blood clots. Controlling your diet and exercising will help lower the risk of developing blood clots. CANCER SCREENING  Breast Cancer: Take steps to reduce your risk of breast cancer.  You should practice "breast self-awareness." This means understanding the normal appearance and feel of your breasts and should include breast self-examination. Any changes detected, no matter how small, should be reported to your caregiver.  After age 40, you should have a clinical breast exam (CBE) every year.  Starting at age 40, you should consider having a mammogram (breast X-ray) every year.  If you have a family history of breast cancer, talk to your caregiver about genetic screening.  If you are at high risk for breast cancer, talk to your caregiver about having an MRI and a mammogram every year.  Intestinal or Stomach Cancer: Tests to consider are a rectal exam, fecal occult blood, sigmoidoscopy, and colonoscopy. Women who are high risk may need to be screened at an earlier age and more often.  Cervical Cancer:  Beginning at age 30, you should have a Pap test every 3 years as long as the past 3 Pap tests have been normal.  If you have had past treatment for cervical cancer or a condition that could lead to cancer, you need Pap tests and screening for cancer for at least 20 years after your treatment.  If you had a hysterectomy for a problem that was not cancer or a condition that could lead to cancer, then you no longer need Pap tests.    If you are between ages 65 and 70, and you have had normal Pap tests going back 10 years, you no longer need Pap tests.  If Pap tests have been discontinued, risk factors (such as a new sexual partner) need to be reassessed to determine if screening should be resumed.  Some medical problems can increase the chance of getting cervical cancer. In these  cases, your caregiver may recommend more frequent screening and Pap tests.  Uterine Cancer: If you have vaginal bleeding after reaching menopause, you should notify your caregiver.  Ovarian Cancer: Other than yearly pelvic exams, there are no reliable tests available to screen for ovarian cancer at this time except for yearly pelvic exams.  Lung Cancer: Yearly chest X-rays can detect lung cancer and should be done on high risk women, such as cigarette smokers and women with chronic lung disease (emphysema).  Skin Cancer: A complete body skin exam should be done at your yearly examination. Avoid overexposure to the sun and ultraviolet light lamps. Use a strong sun block cream when in the sun. All of these things are important for lowering the risk of skin cancer. MENOPAUSE Menopause Symptoms: Hormone therapy products are effective for treating symptoms associated with menopause:  Moderate to severe hot flashes.  Night sweats.  Mood swings.  Headaches.  Tiredness.  Loss of sex drive.  Insomnia.  Other symptoms. Hormone replacement carries certain risks, especially in older women. Women who use or are thinking about using estrogen or estrogen with progestin treatments should discuss that with their caregiver. Your caregiver will help you understand the benefits and risks. The ideal dose of hormone replacement therapy is not known. The Food and Drug Administration (FDA) has concluded that hormone therapy should be used only at the lowest doses and for the shortest amount of time to reach treatment goals.  OSTEOPOROSIS Protecting Against Bone Loss and Preventing Fracture If you use hormone therapy for prevention of bone loss (osteoporosis), the risks for bone loss must outweigh the risk of the therapy. Ask your caregiver about other medications known to be safe and effective for preventing bone loss and fractures. To guard against bone loss or fractures, the following is recommended:  If  you are younger than age 50, take 1000 mg of calcium and at least 600 mg of Vitamin D per day.  If you are older than age 50 but younger than age 70, take 1200 mg of calcium and at least 600 mg of Vitamin D per day.  If you are older than age 70, take 1200 mg of calcium and at least 800 mg of Vitamin D per day. Smoking and excessive alcohol intake increases the risk of osteoporosis. Eat foods rich in calcium and vitamin D and do weight bearing exercises several times a week as your caregiver suggests. DIABETES Diabetes Mellitus: If you have type I or type 2 diabetes, you should keep your blood sugar under control with diet, exercise, and recommended medication. Avoid starchy and fatty foods, and too many sweets. Being overweight can make diabetes control more difficult. COGNITION AND MEMORY Cognition and Memory: Menopausal hormone therapy is not recommended for the prevention of cognitive disorders such as Alzheimer's disease or memory loss.  DEPRESSION  Depression may occur at any age, but it is common in elderly women. This may be because of physical, medical, social (loneliness), or financial problems and needs. If you are experiencing depression because of medical problems and control of symptoms, talk to your caregiver about this. Physical   activity and exercise may help with mood and sleep. Community and volunteer involvement may improve your sense of value and worth. If you have depression and you feel that the problem is getting worse or becoming severe, talk to your caregiver about which treatment options are best for you. ACCIDENTS  Accidents are common and can be serious in elderly woman. Prepare your house to prevent accidents. Eliminate throw rugs, place hand bars in bath, shower, and toilet areas. Avoid wearing high heeled shoes or walking on wet, snowy, and icy areas. Limit or stop driving if you have vision or hearing problems, or if you feel you are unsteady with your movements and  reflexes. HEPATITIS C Hepatitis C is a type of viral infection affecting the liver. It is spread mainly through contact with blood from an infected person. It can be treated, but if left untreated, it can lead to severe liver damage over the years. Many people who are infected do not know that the virus is in their blood. If you are a "baby-boomer", it is recommended that you have one screening test for Hepatitis C. IMMUNIZATIONS  Several immunizations are important to consider having during your senior years, including:   Tetanus, diphtheria, and pertussis booster shot.  Influenza every year before the flu season begins.  Pneumonia vaccine.  Shingles vaccine.  Others, as indicated based on your specific needs. Talk to your caregiver about these. Document Released: 06/22/2005 Document Revised: 09/14/2013 Document Reviewed: 02/16/2008 [employer] Patient Information 2015 [employer]. This information is not intended to replace advice given to you by your health care provider. Make sure you discuss any questions you have with your health care provider.  

## 2014-02-08 ENCOUNTER — Telehealth: Payer: Self-pay | Admitting: Internal Medicine

## 2014-02-08 MED ORDER — EZETIMIBE 10 MG PO TABS: 10.0000 mg | ORAL_TABLET | Freq: Every day | ORAL | Status: DC

## 2014-02-08 NOTE — Telephone Encounter (Signed)
Pt called in wanting some samples of Zetia. Please call back to notify her if we haqve any  Thanks

## 2014-02-08 NOTE — Telephone Encounter (Signed)
We have 1 week of Zetia. Called patient to let her know.  I advised her to call us after Wednesday for more that 1 week. Patient understood and agreed with plan.  I will set this 1 week up at the front, just in case.

## 2014-02-11 ENCOUNTER — Telehealth: Payer: Self-pay

## 2014-02-11 MED ORDER — EZETIMIBE 10 MG PO TABS: 10.0000 mg | ORAL_TABLET | Freq: Every day | ORAL | Status: DC

## 2014-02-11 NOTE — Telephone Encounter (Signed)
Patient called earlier this week for samples of Zetia. We didn't have samples at the time. Told her to return call today, (02/11/2014), for samples.  Samples left at front desk for patient pick-up.  Patient notified.

## 2014-02-26 ENCOUNTER — Other Ambulatory Visit: Payer: Self-pay

## 2014-03-05 ENCOUNTER — Ambulatory Visit (INDEPENDENT_AMBULATORY_CARE_PROVIDER_SITE_OTHER): Payer: Medicare HMO

## 2014-03-05 DIAGNOSIS — Z23 Encounter for immunization: Secondary | ICD-10-CM

## 2014-03-08 ENCOUNTER — Other Ambulatory Visit: Payer: Self-pay

## 2014-03-08 MED ORDER — PANTOPRAZOLE SODIUM 40 MG PO TBEC: 40.0000 mg | DELAYED_RELEASE_TABLET | Freq: Every day | ORAL | Status: DC

## 2014-03-15 ENCOUNTER — Encounter: Payer: Self-pay | Admitting: Women's Health

## 2014-04-01 ENCOUNTER — Encounter: Payer: Self-pay | Admitting: Internal Medicine

## 2014-04-01 ENCOUNTER — Ambulatory Visit (INDEPENDENT_AMBULATORY_CARE_PROVIDER_SITE_OTHER): Payer: Medicare HMO | Admitting: Internal Medicine

## 2014-04-01 VITALS — BP 112/68 | HR 67 | Temp 97.6°F | Wt 145.2 lb

## 2014-04-01 DIAGNOSIS — R002 Palpitations: Secondary | ICD-10-CM

## 2014-04-01 DIAGNOSIS — E785 Hyperlipidemia, unspecified: Secondary | ICD-10-CM

## 2014-04-01 DIAGNOSIS — I10 Essential (primary) hypertension: Secondary | ICD-10-CM

## 2014-04-01 NOTE — Assessment & Plan Note (Signed)
Hypertension, continue with Micardis, check a BMP

## 2014-04-01 NOTE — Patient Instructions (Signed)
Stop by the front desk and schedule labs to be done within few days (fasting)    Please come back to the office by 05-2014 for a physical exam.  Depending on your labs,  you may need to come back  fasting Schedule the visit at the front desk

## 2014-04-01 NOTE — Progress Notes (Signed)
Pre visit review using our clinic review tool, if applicable. No additional management support is needed unless otherwise documented below in the visit note. 

## 2014-04-01 NOTE — Assessment & Plan Note (Addendum)
See previous entry, since the last visit she saw cardiology and started Zetia. Is not taking the OTC medication to decrease cholesterol. We agreed to check a cholesterol panel and see how she's doing. Zetia is very expensive and wonders if she could get a tier exception from her insurance company, she is intolerant to 5 statins (see below), I asked her to send me whatever paperwork is necessary. We also talked   about go back to cards reg Praluent but caused is an issue for the patient. Multiple questions answered to the best of my ability --- Intolerant to the following meds Simvastatin--tried in 2007 Pravachol use it since November 2011 and stopped January 2012 Lipitor started 07/01/2010, stop it several months later. Crestor tried in 2012 Livalo intolerant as well

## 2014-04-01 NOTE — Assessment & Plan Note (Signed)
Symptoms well-controlled, would like to transfer to refill of Cardizem to this office, does not like to go back to cardiology unless she has any problems.

## 2014-04-01 NOTE — Progress Notes (Signed)
Subjective:    Patient ID: Emily Thompson, female    DOB: 10-19-1945, 68 y.o.   MRN: [id number]  DOS:  04/01/2014 Type of visit - description : Here to discuss several issues Interval history: High cholesterol, saw cardiology few months ago, started zetia, no apparent side effects. Palpitations, saw cardiology, asymptomatic, would like to transfer her prescription of Cardizem to this office GERD, symptoms well-controlled, still taking mostly PPIs and occasionally H2 blockers Hypertension, ambulatory BPs 741/28, diastolic sometimes in the 50s, she feels okay.   ROS Denies palpitations, muscle aches. She remains very physically active and eats healthy  Past Medical History  Diagnosis Date  . GERD (gastroesophageal reflux disease)   . Osteopenia   . Breast cancer 2003    chemo, radiation, lumpectomy, reconstructive surgery   . SVT (supraventricular tachycardia)     used to see Dr Rex Kras   . Dyslipidemia   . Hearing aid worn   . Hypertension   . Melanoma 2012    righ side face; basal cell (nose)   . BCC (basal cell carcinoma of skin) 2013    Past Surgical History  Procedure Laterality Date  . Breast lumpectomy Left 07/2001  . Breast reconstruction  2005  . Tubal ligation  1978  . Vulva /perineum biopsy      PAPILLOMA  . Cesarean section  1977  . Tonsillectomy and adenoidectomy  1952  . Nose surgery      basal cell carcinoma removed  . Basal cell carcinoma excision    . Skin cancer excision      melanoma  . Colonoscopy w/ polypectomy  2013    NEXT DUE IN 5 YRS  . Transthoracic echocardiogram  12/2005    EF 50-55%; mild MR; trace TR  . Nm myocar perf wall motion  01/2006    bruce myoview; no evidence of inducible ischemia, anterior wall thinning without evidence of ischemia; post-stress EF 88%; low risk scan     History   Social History  . Marital Status: Widowed    Spouse Name: N/A    Number of Children: 2  . Years of Education: BSN   Occupational History   . 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 Smoking status: Former Smoker -- 10 years    Types: Cigarettes    Quit date: 05/20/1973  . Smokeless tobacco: Never Used  . Alcohol Use: 2.4 oz/week    2 Glasses of wine, 2 Cans of beer per week     Comment: 4 A WEEK  . Drug Use: No  . Sexual Activity: Yes    Birth Control/ Protection: Post-menopausal   Other Topics Concern  . Not on file   Social History Narrative   Widow, 1 adopted and 1 natural child        Medication List       This list is accurate as of: 04/01/14  2:54 PM.  Always use your most recent med list.               aspirin 81 MG tablet  Take 81 mg by mouth daily.     betamethasone dipropionate 0.05 % cream  Commonly known as:  DIPROLENE  Apply topically 2 (two) times daily.     CITRACAL + D PO  Take 1,200 mg by mouth daily.     diltiazem 240 MG 24 hr capsule  Commonly known as:  CARDIZEM CD  Take 1 capsule (  240 mg total) by mouth daily.     ezetimibe 10 MG tablet  Commonly known as:  ZETIA  Take 1 tablet (10 mg total) by mouth daily.     pantoprazole 40 MG tablet  Commonly known as:  PROTONIX  Take 1 tablet (40 mg total) by mouth daily.     PRESERVISION AREDS 2 PO  Take 1 capsule by mouth daily.     ranitidine 300 MG tablet  Commonly known as:  ZANTAC  Take 1 tablet (300 mg total) by mouth at bedtime.     riboflavin 100 MG Tabs tablet  Commonly known as:  VITAMIN B-2  Take 200 mg by mouth daily.     telmisartan 40 MG tablet  Commonly known as:  MICARDIS  TAKE 1 TABLET BY MOUTH EVERY MORNING.     TURMERIC PO  Take by mouth daily.           Objective:   Physical Exam BP 112/68 mmHg  Pulse 67  Temp(Src) 97.6 F (36.4 C) (Oral)  Wt 145 lb 4 oz (65.885 kg)  SpO2 97%  LMP 05/14/1997 General -- alert, well-developed, NAD.   Lungs -- normal respiratory effort, no intercostal retractions, no accessory muscle use, and normal breath sounds.    Heart-- normal rate, regular rhythm, no murmur.   Extremities-- no pretibial edema bilaterally  Neurologic--  alert & oriented X3. Speech normal, gait appropriate for age, strength symmetric and appropriate for age.  Psych-- Cognition and judgment appear intact. Cooperative with normal attention span and concentration. No anxious or depressed appearing.      Assessment & Plan:  Today , I spent more than 25   min with the patient: >50% of the time counseling regards her statin intolerance, gave me a list of 5 statins she has tried. Also reviewing the chart and notes from other providers

## 2014-04-02 ENCOUNTER — Other Ambulatory Visit (INDEPENDENT_AMBULATORY_CARE_PROVIDER_SITE_OTHER): Payer: Medicare HMO

## 2014-04-02 DIAGNOSIS — I10 Essential (primary) hypertension: Secondary | ICD-10-CM

## 2014-04-02 DIAGNOSIS — E785 Hyperlipidemia, unspecified: Secondary | ICD-10-CM

## 2014-04-02 LAB — BASIC METABOLIC PANEL
BUN: 17 mg/dL (ref 6–23)
CO2: 29 mEq/L (ref 19–32)
Calcium: 9.4 mg/dL (ref 8.4–10.5)
Chloride: 106 mEq/L (ref 96–112)
Creatinine, Ser: 0.9 mg/dL (ref 0.4–1.2)
GFR: 66.58 mL/min (ref >60.00)
Glucose, Bld: 81 mg/dL (ref 70–99)
Potassium: 4.9 mEq/L (ref 3.5–5.1)
Sodium: 140 mEq/L (ref 135–145)

## 2014-04-02 LAB — LIPID PANEL
Cholesterol: 208 mg/dL — ABNORMAL HIGH (ref 0–200)
HDL: 67.1 mg/dL (ref >39.00)
LDL Cholesterol: 123 mg/dL — ABNORMAL HIGH (ref 0–99)
NonHDL: 140.9
Total CHOL/HDL Ratio: 3
Triglycerides: 88 mg/dL (ref 0.0–149.0)
VLDL: 17.6 mg/dL (ref 0.0–40.0)

## 2014-04-04 ENCOUNTER — Other Ambulatory Visit: Payer: Self-pay | Admitting: Internal Medicine

## 2014-04-05 NOTE — Telephone Encounter (Signed)
Rx was sent to pharmacy electronically. 

## 2014-04-14 ENCOUNTER — Other Ambulatory Visit: Payer: Self-pay | Admitting: Dermatology

## 2014-05-14 HISTORY — PX: ABDOMINAL HYSTERECTOMY: SHX81

## 2014-05-31 ENCOUNTER — Telehealth: Payer: Self-pay | Admitting: Internal Medicine

## 2014-05-31 ENCOUNTER — Encounter: Payer: Self-pay | Admitting: Internal Medicine

## 2014-05-31 ENCOUNTER — Ambulatory Visit (INDEPENDENT_AMBULATORY_CARE_PROVIDER_SITE_OTHER): Payer: Medicare HMO | Admitting: Internal Medicine

## 2014-05-31 VITALS — BP 148/81 | HR 69 | Temp 98.1°F | Ht 60.0 in | Wt 146.4 lb

## 2014-05-31 DIAGNOSIS — K219 Gastro-esophageal reflux disease without esophagitis: Secondary | ICD-10-CM

## 2014-05-31 DIAGNOSIS — E785 Hyperlipidemia, unspecified: Secondary | ICD-10-CM

## 2014-05-31 DIAGNOSIS — I10 Essential (primary) hypertension: Secondary | ICD-10-CM

## 2014-05-31 DIAGNOSIS — Z0000 Encounter for general adult medical examination without abnormal findings: Secondary | ICD-10-CM

## 2014-05-31 MED ORDER — EZETIMIBE 10 MG PO TABS: 10.0000 mg | ORAL_TABLET | Freq: Every day | ORAL | Status: DC

## 2014-05-31 MED ORDER — TELMISARTAN 40 MG PO TABS: 40.0000 mg | ORAL_TABLET | Freq: Every morning | ORAL | Status: DC

## 2014-05-31 MED ORDER — DILTIAZEM HCL ER COATED BEADS 240 MG PO CP24: 240.0000 mg | ORAL_CAPSULE | Freq: Every day | ORAL | Status: DC

## 2014-05-31 NOTE — Telephone Encounter (Signed)
Spoke with Emily Thompson, informed them that she is intolerant to "statin drugs", causes hot flashes and headaches. Pt has previously tried and failed, rosuvastatin in 2013, simvastatin in 2012, and Pitavastatin in 2014. Informed by Emily Thompson, Pt will still need to try and fail Crestor, and/or Fenofibrate. Requested they fax a list of medications in lower tiers, and was informed that they would notify Pt of their decision.

## 2014-05-31 NOTE — Telephone Encounter (Signed)
Caller name: Hilton Cork --[employer] Relation to pt: Call back number: [phone]  Pharmacy:  Reason for call:   Case # [id number]  Needs dx code for zetia.

## 2014-05-31 NOTE — Assessment & Plan Note (Signed)
Td ~2014 Pneumonia shot -- at age 69 prevnar-- will check w/ her insurance first Had a Flu shot  Shingles shot-- completed   cscope-- had one ~ 10 years ago, Dr Collene Mares , again 09-2011 , next 5 years [facility] 11-2013  (-) Female care per gyn Doing well  w/ diet-exercise

## 2014-05-31 NOTE — Patient Instructions (Signed)
Please come back to the office in 6 months  for a routine check up , fasting     Fall Prevention and Home Safety Falls cause injuries and can affect all age groups. It is possible to use preventive measures to significantly decrease the likelihood of falls. There are many simple measures which can make your home safer and prevent falls. OUTDOORS  Repair cracks and edges of walkways and driveways.  Remove high doorway thresholds.  Trim shrubbery on the main path into your home.  Have good outside lighting.  Clear walkways of tools, rocks, debris, and clutter.  Check that handrails are not broken and are securely fastened. Both sides of steps should have handrails.  Have leaves, snow, and ice cleared regularly.  Use sand or salt on walkways during winter months.  In the garage, clean up grease or oil spills. BATHROOM  Install night lights.  Install grab bars by the toilet and in the tub and shower.  Use non-skid mats or decals in the tub or shower.  Place a plastic non-slip stool in the shower to sit on, if needed.  Keep floors dry and clean up all water on the floor immediately.  Remove soap buildup in the tub or shower on a regular basis.  Secure bath mats with non-slip, double-sided rug tape.  Remove throw rugs and tripping hazards from the floors. BEDROOMS  Install night lights.  Make sure a bedside light is easy to reach.  Do not use oversized bedding.  Keep a telephone by your bedside.  Have a firm chair with side arms to use for getting dressed.  Remove throw rugs and tripping hazards from the floor. KITCHEN  Keep handles on pots and pans turned toward the center of the stove. Use back burners when possible.  Clean up spills quickly and allow time for drying.  Avoid walking on wet floors.  Avoid hot utensils and knives.  Position shelves so they are not too high or low.  Place commonly used objects within easy reach.  If necessary, use a  sturdy step stool with a grab bar when reaching.  Keep electrical cables out of the way.  Do not use floor polish or wax that makes floors slippery. If you must use wax, use non-skid floor wax.  Remove throw rugs and tripping hazards from the floor. STAIRWAYS  Never leave objects on stairs.  Place handrails on both sides of stairways and use them. Fix any loose handrails. Make sure handrails on both sides of the stairways are as long as the stairs.  Check carpeting to make sure it is firmly attached along stairs. Make repairs to worn or loose carpet promptly.  Avoid placing throw rugs at the top or bottom of stairways, or properly secure the rug with carpet tape to prevent slippage. Get rid of throw rugs, if possible.  Have an electrician put in a light switch at the top and bottom of the stairs. OTHER FALL PREVENTION TIPS  Wear low-heel or rubber-soled shoes that are supportive and fit well. Wear closed toe shoes.  When using a stepladder, make sure it is fully opened and both spreaders are firmly locked. Do not climb a closed stepladder.  Add color or contrast paint or tape to grab bars and handrails in your home. Place contrasting color strips on first and last steps.  Learn and use mobility aids as needed. Install an electrical emergency response system.  Turn on lights to avoid dark areas. Replace light bulbs that  burn out immediately. Get light switches that glow.  Arrange furniture to create clear pathways. Keep furniture in the same place.  Firmly attach carpet with non-skid or double-sided tape.  Eliminate uneven floor surfaces.  Select a carpet pattern that does not visually hide the edge of steps.  Be aware of all pets. OTHER HOME SAFETY TIPS  Set the water temperature for 120 F (48.8 C).  Keep emergency numbers on or near the telephone.  Keep smoke detectors on every level of the home and near sleeping areas. Document Released: 04/20/2002 Document Revised:  10/30/2011 Document Reviewed: 07/20/2011 [facility] Patient Information 2015 [place], [place]. This information is not intended to replace advice given to you by your health care provider. Make sure you discuss any questions you have with your health care provider.    Preventive Care for Adults   Ages 2 years and over  Blood pressure check.** / Every 1 to 2 years.  Lipid and cholesterol check.** / Every 5 years beginning at age 24 years.  Lung cancer screening. / Every year if you are aged 48-80 years and have a 30-pack-year history of smoking and currently smoke or have quit within the past 15 years. Yearly screening is stopped once you have quit smoking for at least 15 years or develop a health problem that would prevent you from having lung cancer treatment.  Clinical breast exam.** / Every year after age 29 years.  BRCA-related cancer risk assessment.** / For women who have family members with a BRCA-related cancer (breast, ovarian, tubal, or peritoneal cancers).  Mammogram.** / Every year beginning at age 59 years and continuing for as long as you are in good health. Consult with your health care provider.  Pap test.** / Every 3 years starting at age 53 years through age 28 or 50 years with 3 consecutive normal Pap tests. Testing can be stopped between 65 and 70 years with 3 consecutive normal Pap tests and no abnormal Pap or HPV tests in the past 10 years.  HPV screening.** / Every 3 years from ages 48 years through ages 78 or 14 years with a history of 3 consecutive normal Pap tests. Testing can be stopped between 65 and 70 years with 3 consecutive normal Pap tests and no abnormal Pap or HPV tests in the past 10 years.  Fecal occult blood test (FOBT) of stool. / Every year beginning at age 39 years and continuing until age 34 years. You may not need to do this test if you get a colonoscopy every 10 years.  Flexible sigmoidoscopy or colonoscopy.** / Every 5 years for a flexible  sigmoidoscopy or every 10 years for a colonoscopy beginning at age 62 years and continuing until age 24 years.  Hepatitis C blood test.** / For all people born from [birth year] through 1967 and any individual with known risks for hepatitis C.  Osteoporosis screening.** / A one-time screening for women ages 68 years and over and women at risk for fractures or osteoporosis.  Skin self-exam. / Monthly.  Influenza vaccine. / Every year.  Tetanus, diphtheria, and acellular pertussis (Tdap/Td) vaccine.** / 1 dose of Td every 10 years.  Varicella vaccine.** / Consult your health care provider.  Zoster vaccine.** / 1 dose for adults aged 47 years or older.  Pneumococcal 13-valent conjugate (PCV13) vaccine.** / Consult your health care provider.  Pneumococcal polysaccharide (PPSV23) vaccine.** / 1 dose for all adults aged 34 years and older.  Meningococcal vaccine.** / Consult your health care provider.  Hepatitis A vaccine.** / Consult your health care provider.  Hepatitis B vaccine.** / Consult your health care provider.  Haemophilus influenzae type b (Hib) vaccine.** / Consult your health care provider. ** Family history and personal history of risk and conditions may change your health care provider's recommendations. Document Released: 06/26/2001 Document Revised: 09/14/2013 Document Reviewed: 09/25/2010 [facility] Patient Information 2015 [place], [place]. This information is not intended to replace advice given to you by your health care provider. Make sure you discuss any questions you have with your health care provider.

## 2014-05-31 NOTE — Progress Notes (Signed)
 Subjective:    Patient ID: Emily Thompson, female    DOB: 09/16/1945, 68 y.o.   MRN: [id number]  DOS:  05/31/2014 Type of visit - description :    Here for Medicare AWV: 1. Risk factors based on Past M, S, F history: reviewed 2. Physical Activities: active, occ goes to the gym, tai chi  3. Depression/mood:  Neg screening   4. Hearing:  Has hearing aids, has regular checks 5. ADL's:  Independent, still drives   6. Fall Risk: no recent falls, counseled 7. home Safety: does feel safe at home   8. Height, weight, &visual acuity: see VS, uses glasses , sees eye doctor regularly 9. Counseling: provided 10. Labs ordered based on risk factors: if needed   11. Referral Coordination: if needed 12.  Care Plan, see assessment and plan   13.   Cognitive Assessment: cognition and motor skills wnl 14. Care team update 15.written plan of care provided   In addition, today we discussed the following: Hypertension, good medication compliance, ambulatory BPs 110-130/60's. GERD, symptoms well-controlled High cholesterol, labs reviewed, last cholesterol panel improved  ROS Denies chest pain or difficulty breathing No nausea, vomiting, diarrhea or blood in the stools.  Past Medical History  Diagnosis Date  . GERD (gastroesophageal reflux disease)   . Osteopenia   . Breast cancer 2003    chemo, radiation, lumpectomy, reconstructive surgery   . SVT (supraventricular tachycardia)     used to see Dr Little   . Dyslipidemia   . Hearing aid worn   . Hypertension   . Melanoma 2012    righ side face; basal cell (nose)   . BCC (basal cell carcinoma of skin) 2013    Past Surgical History  Procedure Laterality Date  . Breast lumpectomy Left 07/2001  . Breast reconstruction  2005  . Tubal ligation  1978  . Vulva /perineum biopsy      PAPILLOMA  . Cesarean section  1977  . Tonsillectomy and adenoidectomy  1952  . Nose surgery      basal cell carcinoma removed  . Basal cell carcinoma  excision    . Skin cancer excision      melanoma  . Colonoscopy w/ polypectomy  2013    NEXT DUE IN 5 YRS  . Transthoracic echocardiogram  12/2005    EF 50-55%; mild MR; trace TR  . Nm myocar perf wall motion  01/2006    bruce myoview; no evidence of inducible ischemia, anterior wall thinning without evidence of ischemia; post-stress EF 88%; low risk scan     History   Social History  . Marital Status: Widowed    Spouse Name: N/A    Number of Children: 2  . Years of Education: BSN   Occupational History  . 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 Smoking status: Former Smoker -- 10 years    Types: Cigarettes    Quit date: 05/20/1973  . Smokeless tobacco: Never Used  . Alcohol Use: 2.4 oz/week    2 Glasses of wine, 2 Cans of beer per week     Comment: 4 A WEEK  . Drug Use: No  . Sexual Activity: Yes    Birth Control/ Protection: Post-menopausal   Other Topics Concern  . Not on file   Social History Narrative   Widow, 1 adopted and 1 natural child   Live by herself     Family History    Problem Relation Age of Onset  . Hypertension Mother   . Hypertension Father   . Stroke Father     hemorrhagic CVA at age 21  . Colon cancer Maternal Aunt     dx at age 73s  . Diabetes Brother   . Breast cancer Neg Hx   . Bipolar disorder Brother   . Thyroid disease Mother     M and B (?)       Medication List       This list is accurate as of: 05/31/14 11:59 PM.  Always use your most recent med list.               aspirin 81 MG tablet  Take 81 mg by mouth daily.     betamethasone dipropionate 0.05 % cream  Commonly known as:  DIPROLENE  Apply topically 2 (two) times daily.     CITRACAL + D PO  Take 1,200 mg by mouth daily.     diltiazem 240 MG 24 hr capsule  Commonly known as:  CARDIZEM CD  Take 1 capsule (240 mg total) by mouth daily.     ezetimibe 10 MG tablet  Commonly known as:  ZETIA  Take 1 tablet (10 mg total) by  mouth daily.     lansoprazole 15 MG capsule  Commonly known as:  PREVACID  Take 15 mg by mouth daily at 12 noon.     PRESERVISION AREDS 2 PO  Take 1 capsule by mouth daily.     ranitidine 300 MG tablet  Commonly known as:  ZANTAC  Take 1 tablet (300 mg total) by mouth at bedtime.     riboflavin 100 MG Tabs tablet  Commonly known as:  VITAMIN B-2  Take 200 mg by mouth daily.     telmisartan 40 MG tablet  Commonly known as:  MICARDIS  Take 1 tablet (40 mg total) by mouth every morning.     TURMERIC PO  Take by mouth daily.           Objective:   Physical Exam BP 148/81 mmHg  Pulse 69  Temp(Src) 98.1 F (36.7 C) (Oral)  Ht 5' (1.524 m)  Wt 146 lb 6 oz (66.395 kg)  BMI 28.59 kg/m2  SpO2 98%  LMP 05/14/1997 General -- alert, well-developed, NAD.  Neck --no thyromegaly , normal carotid pulse  HEENT-- Not pale  Lungs -- normal respiratory effort, no intercostal retractions, no accessory muscle use, and normal breath sounds.  Heart-- normal rate, regular rhythm, no murmur.  Abdomen-- Not distended, good bowel sounds,soft, non-tender. Extremities-- no pretibial edema bilaterally  Neurologic--  alert & oriented X3. Speech normal, gait appropriate for age, strength symmetric and appropriate for age.  Psych-- Cognition and judgment appear intact. Cooperative with normal attention span and concentration. No anxious or depressed appearing.       Assessment & Plan:

## 2014-05-31 NOTE — Telephone Encounter (Signed)
Noted  

## 2014-05-31 NOTE — Assessment & Plan Note (Signed)
Well-controlled, refill meds, last BMP satisfactory

## 2014-05-31 NOTE — Assessment & Plan Note (Addendum)
Last cholesterol panel improved, on zetia, RF meds, recheck a FLP on return to the office. Will need a tier 3 exception  base on previous intolerances

## 2014-05-31 NOTE — Telephone Encounter (Signed)
Informed [facility] pharmacy of this and Santiago Glad states that she needs medication history??? Best # [phone]  Ref# [id number]

## 2014-05-31 NOTE — Telephone Encounter (Signed)
[facility], transferred 3 times and still haven't been able to speak with correct dept. On hold for  >15 minutes. Dx code needed: E78.5 for Hyperlipidemia.

## 2014-05-31 NOTE — Assessment & Plan Note (Signed)
Well-controlled with Prevacid OTC and Zantac at night

## 2014-05-31 NOTE — Progress Notes (Signed)
Pre visit review using our clinic review tool, if applicable. No additional management support is needed unless otherwise documented below in the visit note. 

## 2014-06-25 ENCOUNTER — Ambulatory Visit (INDEPENDENT_AMBULATORY_CARE_PROVIDER_SITE_OTHER): Payer: Medicare HMO | Admitting: *Deleted

## 2014-06-25 DIAGNOSIS — Z23 Encounter for immunization: Secondary | ICD-10-CM

## 2014-06-25 NOTE — Progress Notes (Signed)
Pre visit review using our clinic review tool, if applicable. No additional management support is needed unless otherwise documented below in the visit note.  Patient tolerated injection well.  

## 2014-11-29 ENCOUNTER — Ambulatory Visit: Payer: Medicare HMO | Admitting: Internal Medicine

## 2014-12-10 ENCOUNTER — Encounter: Payer: Self-pay | Admitting: Women's Health

## 2014-12-14 ENCOUNTER — Ambulatory Visit (INDEPENDENT_AMBULATORY_CARE_PROVIDER_SITE_OTHER): Payer: Medicare HMO | Admitting: Internal Medicine

## 2014-12-14 ENCOUNTER — Encounter: Payer: Self-pay | Admitting: Internal Medicine

## 2014-12-14 VITALS — BP 124/74 | HR 60 | Temp 97.9°F | Ht 60.0 in | Wt 143.5 lb

## 2014-12-14 DIAGNOSIS — I10 Essential (primary) hypertension: Secondary | ICD-10-CM

## 2014-12-14 DIAGNOSIS — K219 Gastro-esophageal reflux disease without esophagitis: Secondary | ICD-10-CM | POA: Diagnosis not present

## 2014-12-14 DIAGNOSIS — E785 Hyperlipidemia, unspecified: Secondary | ICD-10-CM

## 2014-12-14 LAB — BASIC METABOLIC PANEL
BUN: 12 mg/dL (ref 6–23)
CO2: 32 mEq/L (ref 19–32)
Calcium: 9.6 mg/dL (ref 8.4–10.5)
Chloride: 104 mEq/L (ref 96–112)
Creatinine, Ser: 0.81 mg/dL (ref 0.40–1.20)
GFR: 74.08 mL/min (ref >60.00)
Glucose, Bld: 77 mg/dL (ref 70–99)
Potassium: 3.9 mEq/L (ref 3.5–5.1)
Sodium: 142 mEq/L (ref 135–145)

## 2014-12-14 LAB — LIPID PANEL
Cholesterol: 212 mg/dL — ABNORMAL HIGH (ref 0–200)
HDL: 75.7 mg/dL (ref >39.00)
LDL Cholesterol: 123 mg/dL — ABNORMAL HIGH (ref 0–99)
NonHDL: 136.53
Total CHOL/HDL Ratio: 3
Triglycerides: 69 mg/dL (ref 0.0–149.0)
VLDL: 13.8 mg/dL (ref 0.0–40.0)

## 2014-12-14 NOTE — Progress Notes (Signed)
Pre visit review using our clinic review tool, if applicable. No additional management support is needed unless otherwise documented below in the visit note. 

## 2014-12-14 NOTE — Assessment & Plan Note (Signed)
  Off PPIs. Taking Zantac 75 one tablet in the morning and 2 in the afternoon. She felt that Zantac 300 mg was too strong.

## 2014-12-14 NOTE — Assessment & Plan Note (Addendum)
Despite expenses, she is taking Zetia aproximately 5 times a week, check labs. If needed, she will consider take it every day.

## 2014-12-14 NOTE — Patient Instructions (Signed)
Get your blood work before you leave    

## 2014-12-14 NOTE — Assessment & Plan Note (Addendum)
Ambulatory BPs in the morning 120/60, in the afternoon 130/60. Continue present care, check a BMP

## 2014-12-14 NOTE — Progress Notes (Signed)
Subjective:    Patient ID: Emily Thompson, female    DOB: 1945/06/23, 69 y.o.   MRN: [id number]  DOS:  12/14/2014 Type of visit - description : Routine visit Interval history: High cholesterol, good compliance with Zetia despite high cost Hypertension, ambulatory BPs very good. Due for a BMP GERD, off PPIs, on Zantac, doing well   Review of Systems No chest pain or difficulty breathing. No lower extremity edema No nausea, vomiting, diarrhea  Past Medical History  Diagnosis Date  . GERD (gastroesophageal reflux disease)   . Osteopenia   . Breast cancer 2003    chemo, radiation, lumpectomy, reconstructive surgery   . SVT (supraventricular tachycardia)     used to see Dr Rex Kras   . Dyslipidemia   . Hearing aid worn   . Hypertension   . Melanoma 2012    righ side face; basal cell (nose)   . BCC (basal cell carcinoma of skin) 2013, 2016    Past Surgical History  Procedure Laterality Date  . Breast lumpectomy Left 07/2001  . Breast reconstruction  2005  . Tubal ligation  1978  . Vulva /perineum biopsy      PAPILLOMA  . Cesarean section  1977  . Tonsillectomy and adenoidectomy  1952  . Nose surgery      basal cell carcinoma removed  . Basal cell carcinoma excision    . Skin cancer excision      melanoma  . Colonoscopy w/ polypectomy  2013    NEXT DUE IN 5 YRS  . Transthoracic echocardiogram  12/2005    EF 50-55%; mild MR; trace TR  . Nm myocar perf wall motion  01/2006    bruce myoview; no evidence of inducible ischemia, anterior wall thinning without evidence of ischemia; post-stress EF 88%; low risk scan     History   Social History  . Marital Status: Widowed    Spouse Name: N/A  . Number of Children: 2  . Years of Education: BSN   Occupational History  . 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 Smoking status: Former Smoker -- 10 years    Types: Cigarettes    Quit date: 05/20/1973  . Smokeless tobacco: Never Used    . Alcohol Use: 2.4 oz/week    2 Glasses of wine, 2 Cans of beer per week     Comment: 4 A WEEK  . Drug Use: No  . Sexual Activity: Yes    Birth Control/ Protection: Post-menopausal   Other Topics Concern  . Not on file   Social History Narrative   Widow, 1 adopted and 1 natural child   Live by herself        Medication List       This list is accurate as of: 12/14/14  4:58 PM.  Always use your most recent med list.               aspirin 81 MG tablet  Take 81 mg by mouth daily.     betamethasone dipropionate 0.05 % cream  Commonly known as:  DIPROLENE  Apply topically 2 (two) times daily.     CITRACAL + D PO  Take 1,200 mg by mouth daily.     diltiazem 240 MG 24 hr capsule  Commonly known as:  CARDIZEM CD  Take 1 capsule (240 mg total) by mouth daily.     ezetimibe 10 MG tablet  Commonly known as:  ZETIA  Take 1 tablet (10 mg total) by mouth daily.     PRESERVISION AREDS 2 PO  Take 1 capsule by mouth daily.     ranitidine 75 MG tablet  Commonly known as:  ZANTAC  Take by mouth. 1 in the morning, 2 at night     riboflavin 100 MG Tabs tablet  Commonly known as:  VITAMIN B-2  Take 200 mg by mouth daily.     telmisartan 40 MG tablet  Commonly known as:  MICARDIS  Take 1 tablet (40 mg total) by mouth every morning.     TURMERIC PO  Take by mouth daily.           Objective:   Physical Exam BP 124/74 mmHg  Pulse 60  Temp(Src) 97.9 F (36.6 C) (Oral)  Ht 5' (1.524 m)  Wt 143 lb 8 oz (65.091 kg)  BMI 28.03 kg/m2  SpO2 97%  LMP 05/14/1997 General:   Well developed, well nourished . NAD.  HEENT:  Normocephalic . Face symmetric, atraumatic Lungs:  CTA B Normal respiratory effort, no intercostal retractions, no accessory muscle use. Heart: RRR,  no murmur.  No pretibial edema bilaterally  Skin: Not pale. Not jaundice Neurologic:  alert & oriented X3.  Speech normal, gait appropriate for age and unassisted Psych--  Cognition and judgment  appear intact.  Cooperative with normal attention span and concentration.  Behavior appropriate. No anxious or depressed appearing.      Assessment & Plan:

## 2015-01-03 ENCOUNTER — Ambulatory Visit (INDEPENDENT_AMBULATORY_CARE_PROVIDER_SITE_OTHER): Payer: Medicare HMO | Admitting: Gynecology

## 2015-01-03 ENCOUNTER — Other Ambulatory Visit (HOSPITAL_COMMUNITY)
Admission: RE | Admit: 2015-01-03 | Discharge: 2015-01-03 | Disposition: A | Discharge status: Home / Self Care | Payer: Medicare HMO | Source: Ambulatory Visit | Attending: Gynecology | Admitting: Gynecology

## 2015-01-03 VITALS — BP 122/70

## 2015-01-03 DIAGNOSIS — Z124 Encounter for screening for malignant neoplasm of cervix: Secondary | ICD-10-CM

## 2015-01-03 DIAGNOSIS — R87619 Unspecified abnormal cytological findings in specimens from cervix uteri: Secondary | ICD-10-CM | POA: Diagnosis present

## 2015-01-03 DIAGNOSIS — Z853 Personal history of malignant neoplasm of breast: Secondary | ICD-10-CM

## 2015-01-03 DIAGNOSIS — N952 Postmenopausal atrophic vaginitis: Secondary | ICD-10-CM | POA: Diagnosis not present

## 2015-01-03 DIAGNOSIS — Z1151 Encounter for screening for human papillomavirus (HPV): Secondary | ICD-10-CM | POA: Insufficient documentation

## 2015-01-03 DIAGNOSIS — N950 Postmenopausal bleeding: Secondary | ICD-10-CM | POA: Diagnosis not present

## 2015-01-03 HISTORY — DX: Postmenopausal bleeding: N95.0

## 2015-01-03 NOTE — Addendum Note (Signed)
Addended by: Nelva Nay on: 01/03/2015 01:01 PM   Modules accepted: Orders

## 2015-01-03 NOTE — Progress Notes (Signed)
   Patient is a 69 year old who is been complaining of vaginal spotting for the past several days when she wipes. Review of patient's history indicated that she has a history of left breast carcinoma which was treated with lumpectomy, chemotherapy, radiation and tamoxifen which she is currently not on. She has had breast reconstruction on the left as well as a breast lift on the right. She has always had normal Pap smears. 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 She has a small lipoma on her right buttock which has remained stable. Patient not sexually active. Patient with no GU or GI complaints.  Exam: Blood pressure 122/70 Gen. appearance well-developed on nurse female with a major complaint Pelvic: Bartholin urethra Skene glands with atrophic changes Vagina: Atrophic changes friable on contact Cervix: Atrophic and no stenotic cervical loss Bimanual exam: Uterus axial position normal size shape and consistency nontender Adnexa: No palpable masses or tenderness Rectal exam: Not done  Assessment/plan: 69 year old patient with past history of breast cancer with postmenopausal bleeding currently on no hormone replacement therapy not sexually active. It appears that the source of her bleeding may be coming from atrophic vagina because it was friable on contact. Due to the fact that her cervical os is stenotic we will unable to do an endometrial biopsy. For this reason we will have the patient return back to the office for an ultrasound for endometrial stripe measurement. If the endometrial stripe measurement is over 5 mm we will need to do a paracervical block dilate her cervix and attempted endometrial biopsy or schedule it as  an outpatient surgical procedure. A Pap smear with HPV screen was done today.

## 2015-01-05 ENCOUNTER — Other Ambulatory Visit: Payer: Self-pay | Admitting: Gynecology

## 2015-01-05 ENCOUNTER — Encounter: Payer: Self-pay | Admitting: Gynecology

## 2015-01-05 ENCOUNTER — Ambulatory Visit (INDEPENDENT_AMBULATORY_CARE_PROVIDER_SITE_OTHER): Payer: Medicare HMO | Admitting: Gynecology

## 2015-01-05 ENCOUNTER — Ambulatory Visit (INDEPENDENT_AMBULATORY_CARE_PROVIDER_SITE_OTHER): Payer: Medicare HMO

## 2015-01-05 VITALS — BP 134/86

## 2015-01-05 DIAGNOSIS — R102 Pelvic and perineal pain: Secondary | ICD-10-CM

## 2015-01-05 DIAGNOSIS — Z853 Personal history of malignant neoplasm of breast: Secondary | ICD-10-CM

## 2015-01-05 DIAGNOSIS — N9489 Other specified conditions associated with female genital organs and menstrual cycle: Secondary | ICD-10-CM | POA: Diagnosis not present

## 2015-01-05 DIAGNOSIS — R938 Abnormal findings on diagnostic imaging of other specified body structures: Secondary | ICD-10-CM

## 2015-01-05 DIAGNOSIS — R9389 Abnormal findings on diagnostic imaging of other specified body structures: Secondary | ICD-10-CM

## 2015-01-05 DIAGNOSIS — N950 Postmenopausal bleeding: Secondary | ICD-10-CM

## 2015-01-05 HISTORY — DX: Abnormal findings on diagnostic imaging of other specified body structures: R93.89

## 2015-01-05 LAB — CYTOLOGY - PAP

## 2015-01-05 MED ORDER — LIDOCAINE HCL 1 % IJ SOLN
10.0000 mL | Freq: Once | INTRAMUSCULAR | Status: AC
  Administered 2015-01-05: 10 mL

## 2015-01-05 MED ORDER — AMOXICILLIN-POT CLAVULANATE 875-125 MG PO TABS: 1.0000 | ORAL_TABLET | Freq: Two times a day (BID) | ORAL | Status: DC

## 2015-01-05 NOTE — Patient Instructions (Signed)
Hysteroscopy °Hysteroscopy is a procedure used for looking inside the womb (uterus). It may be done for various reasons, including: °· To evaluate abnormal bleeding, fibroid (benign, noncancerous) tumors, polyps, scar tissue (adhesions), and possibly cancer of the uterus. °· To look for lumps (tumors) and other uterine growths. °· To look for causes of why a woman cannot get pregnant (infertility), causes of recurrent loss of pregnancy (miscarriages), or a lost intrauterine device (IUD). °· To perform a sterilization by blocking the fallopian tubes from inside the uterus. °In this procedure, a thin, flexible tube with a tiny light and camera on the end of it (hysteroscope) is used to look inside the uterus. A hysteroscopy should be done right after a menstrual period to be sure you are not pregnant. °LET YOUR HEALTH CARE PROVIDER KNOW ABOUT:  °· Any allergies you have. °· All medicines you are taking, including vitamins, herbs, eye drops, creams, and over-the-counter medicines. °· Previous problems you or members of your family have had with the use of anesthetics. °· Any blood disorders you have. °· Previous surgeries you have had. °· Medical conditions you have. °RISKS AND COMPLICATIONS  °Generally, this is a safe procedure. However, as with any procedure, complications can occur. Possible complications include: °· Putting a hole in the uterus. °· Excessive bleeding. °· Infection. °· Damage to the cervix. °· Injury to other organs. °· Allergic reaction to medicines. °· Too much fluid used in the uterus for the procedure. °BEFORE THE PROCEDURE  °· Ask your health care provider about changing or stopping any regular medicines. °· Do not take aspirin or blood thinners for 1 week before the procedure, or as directed by your health care provider. These can cause bleeding. °· If you smoke, do not smoke for 2 weeks before the procedure. °· In some cases, a medicine is placed in the cervix the day before the procedure.  This medicine makes the cervix have a larger opening (dilate). This makes it easier for the instrument to be inserted into the uterus during the procedure. °· Do not eat or drink anything for at least 8 hours before the surgery. °· Arrange for someone to take you home after the procedure. °PROCEDURE  °· You may be given a medicine to relax you (sedative). You may also be given one of the following: °¨ A medicine that numbs the area around the cervix (local anesthetic). °¨ A medicine that makes you sleep through the procedure (general anesthetic). °· The hysteroscope is inserted through the vagina into the uterus. The camera on the hysteroscope sends a picture to a TV screen. This gives the surgeon a good view inside the uterus. °· During the procedure, air or a liquid is put into the uterus, which allows the surgeon to see better. °· Sometimes, tissue is gently scraped from inside the uterus. These tissue samples are sent to a lab for testing. °AFTER THE PROCEDURE  °· If you had a general anesthetic, you may be groggy for a couple hours after the procedure. °· If you had a local anesthetic, you will be able to go home as soon as you are stable and feel ready. °· You may have some cramping. This normally lasts for a couple days. °· You may have bleeding, which varies from light spotting for a few days to menstrual-like bleeding for 3-7 days. This is normal. °· If your test results are not back during the visit, make an appointment with your health care provider to find out the   results. Document Released: 08/06/2000 Document Revised: 02/18/2013 Document Reviewed: 11/27/2012 [facility] Patient Information 2015 [place], [place]. This information is not intended to replace advice given to you by your health care provider. Make sure you discuss any questions you have with your health care provider. Amoxicillin; Clavulanic Acid tablets What is this medicine? AMOXICILLIN; CLAVULANIC ACID (a mox i SIL in; KLAV yoo lan ic  AS id) is a penicillin antibiotic. It is used to treat certain kinds of bacterial infections. It will not work for colds, flu, or other viral infections. This medicine may be used for other purposes; ask your health care provider or pharmacist if you have questions. COMMON BRAND NAME(S): Augmentin What should I tell my health care provider before I take this medicine? They need to know if you have any of these conditions: -bowel disease, like colitis -kidney disease -liver disease -mononucleosis -an unusual or allergic reaction to amoxicillin, penicillin, cephalosporin, other antibiotics, clavulanic acid, other medicines, foods, dyes, or preservatives -pregnant or trying to get pregnant -breast-feeding How should I use this medicine? Take this medicine by mouth with a full glass of water. Follow the directions on the prescription label. Take at the start of a meal. Do not crush or chew. If the tablet has a score line, you may cut it in half at the score line for easier swallowing. Take your medicine at regular intervals. Do not take your medicine more often than directed. Take all of your medicine as directed even if you think you are better. Do not skip doses or stop your medicine early. Talk to your pediatrician regarding the use of this medicine in children. Special care may be needed. Overdosage: If you think you have taken too much of this medicine contact a poison control center or emergency room at once. NOTE: This medicine is only for you. Do not share this medicine with others. What if I miss a dose? If you miss a dose, take it as soon as you can. If it is almost time for your next dose, take only that dose. Do not take double or extra doses. What may interact with this medicine? -allopurinol -anticoagulants -birth control pills -methotrexate -probenecid This list may not describe all possible interactions. Give your health care provider a list of all the medicines, herbs,  non-prescription drugs, or dietary supplements you use. Also tell them if you smoke, drink alcohol, or use illegal drugs. Some items may interact with your medicine. What should I watch for while using this medicine? Tell your doctor or health care professional if your symptoms do not improve. Do not treat diarrhea with over the counter products. Contact your doctor if you have diarrhea that lasts more than 2 days or if it is severe and watery. If you have diabetes, you may get a false-positive result for sugar in your urine. Check with your doctor or health care professional. Birth control pills may not work properly while you are taking this medicine. Talk to your doctor about using an extra method of birth control. What side effects may I notice from receiving this medicine? Side effects that you should report to your doctor or health care professional as soon as possible: -allergic reactions like skin rash, itching or hives, swelling of the face, lips, or tongue -breathing problems -dark urine -fever or chills, sore throat -redness, blistering, peeling or loosening of the skin, including inside the mouth -seizures -trouble passing urine or change in the amount of urine -unusual bleeding, bruising -unusually weak or tired -  white patches or sores in the mouth or throat Side effects that usually do not require medical attention (report to your doctor or health care professional if they continue or are bothersome): -diarrhea -dizziness -headache -nausea, vomiting -stomach upset -vaginal or anal irritation This list may not describe all possible side effects. Call your doctor for medical advice about side effects. You may report side effects to FDA at 1-800-FDA-1088. Where should I keep my medicine? Keep out of the reach of children. Store at room temperature below 25 degrees C (77 degrees F). Keep container tightly closed. Throw away any unused medicine after the expiration date. NOTE:  This sheet is a summary. It may not cover all possible information. If you have questions about this medicine, talk to your doctor, pharmacist, or health care provider.  2015, [employer]. (2007-07-24 12:04:30)

## 2015-01-05 NOTE — Progress Notes (Signed)
   patient is a 69 year old who was seen the office on August 22 complaining of vaginal bleeding when she wiped.Review of patient's history indicated that she has a history of left breast carcinoma which was treated with lumpectomy, chemotherapy, radiation and tamoxifen which she is currently not on. She has had breast reconstruction on the left as well as a breast lift on the right. She has always had normal Pap smears. 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 She has a small lipoma on her right buttock which has remained stable. Patient not sexually active. Patient with no GU or GI complaints. My first impression was that her bleeding was attributed from an atrophic vagina because it was very friable on contact. 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  The patient was counseled for cervical dilatation and endometrial biopsy. The ultrasound demonstrated the following: Uterus measured 9.0 x 4.4 x 3.0 cm with endometrial stripe of 17 mm. The uterus was retroflexed prominent endometrial cavity with positive vascular flow. Right and left ovary were normal with normal echo pattern no fluid in the cul-de-sac.  The cervix was cleansed with Betadine solution a paracervical block with 1% lidocaine was infiltrated at the cervical stroma at the 2, 4, 8, and 10:00 position. With a small lacrimal probe to identify the cervical os required serial dilatation with different dilators to facilitate  insertion of the sterile uterine catheter whereby normal saline was instilled an intrauterine defect measuring 23 x 21 x 19 mm was noted. Following this an endometrial biopsy with a sterile Pipelle was accomplished and moderate amount of tissue was obtained and submitted for histological evaluation.  Assessment/plan: Patient with postmenopausal bleeding past history of breast cancer currently on no hormone replacement therapy with polypoid-like lesion noted in the uterine cavity. Endometrial biopsy was done today result pending at time of this dictation. We'll plan on resectoscopic polypectomy in the operating room pending today's endometrial biopsy. If malignant processes present she will be referred to the GYN oncologist. For prophylaxis she was placed and augment 875 mg 1 by mouth twice a day for 7 days. Of note patient's last Pap smear 2013 was normal. Patient reports no past history of abnormal Pap smears.

## 2015-01-06 ENCOUNTER — Telehealth: Payer: Self-pay

## 2015-01-06 NOTE — Telephone Encounter (Signed)
Left message to call me about scheduling surgery.

## 2015-01-06 NOTE — Telephone Encounter (Signed)
Patient called back. Scheduled her for 02/08/15 with Dr. Moshe Salisbury for Resectoscopic Polypectomy. Pre Op consult scheduled.

## 2015-01-07 ENCOUNTER — Encounter: Payer: Self-pay | Admitting: Gynecology

## 2015-01-07 ENCOUNTER — Telehealth: Payer: Self-pay | Admitting: *Deleted

## 2015-01-07 ENCOUNTER — Ambulatory Visit (INDEPENDENT_AMBULATORY_CARE_PROVIDER_SITE_OTHER): Payer: Medicare HMO | Admitting: Gynecology

## 2015-01-07 VITALS — BP 130/78

## 2015-01-07 DIAGNOSIS — C55 Malignant neoplasm of uterus, part unspecified: Secondary | ICD-10-CM

## 2015-01-07 DIAGNOSIS — N950 Postmenopausal bleeding: Secondary | ICD-10-CM

## 2015-01-07 DIAGNOSIS — Z1211 Encounter for screening for malignant neoplasm of colon: Secondary | ICD-10-CM

## 2015-01-07 MED ORDER — MEGESTROL ACETATE 40 MG PO TABS: 40.0000 mg | ORAL_TABLET | Freq: Two times a day (BID) | ORAL | Status: DC

## 2015-01-07 NOTE — Telephone Encounter (Signed)
-----   Message from Terrance Mass, MD sent at 01/07/2015  8:35 AM EDT -----  Please make an appointment for this patient with the GYN oncology group in reference to her recently diagnosed uterine cancer. I have spoken to the patient this morning she is anticipating your call with arrangements

## 2015-01-07 NOTE — Patient Instructions (Signed)
Uterine Cancer Uterine cancer is an abnormal growth of tissue (tumor) in the uterus that is cancerous (malignant). Unlike noncancerous (benign) tumors, malignant tumors can spread to other parts of your body. The wall of the uterus has two layers of tissue. The inner layer is the endometrium. The outer layer of muscle tissue is the myometrium. The most common type of uterine cancer begins in the endometrium. This is called endometrial cancer. Cancer that begins in the myometrium is called uterine sarcoma, which is very rare.  RISK FACTORS  Although the exact cause of uterine cancer is unknown, there are a number of risk factors that can increase your chances of getting uterine cancer. They include:  Your age. Uterine cancer occurs mostly in women older than 50 years.   Having an enlarged endometrium (endometrial hyperplasia).   Using hormone therapy.   Obesity.   Taking the drug tamoxifen.   White race.   Infertility.   Never being pregnant.   Beginning menstrual periods at an age younger than 12 years.   Having menstrual periods at an age older than 5 years.   Personal history of ovarian, intestinal, or colorectal cancer.   Having a family history of uterine cancer.   Having a family history of hereditary nonpolyposis colon cancer (HNPCC).   Having diabetes, high blood pressure, thyroid disease, or gallbladder disease.   Long-term use of high-dose birth control pills.   Exposure to radiation.   Smoking.  SIGNS AND SYMPTOMS   Abnormal vaginal bleeding or discharge. Bleeding may start as a watery, blood-streaked flow that gradually contains more blood.   Any vaginal bleeding after menopause.   Difficult or painful urination.   Pain during intercourse.   Pain in the pelvic area.  Mass in the vagina.  Pain or fullness in the abdomen.  Frequent urination.  Bleeding between periods.  Growth of the stomach.   Unexplained weight loss.   Uterine cancer usually occurs after menopause. However, it may also occur around the time that menopause begins. Abnormal vaginal bleeding is the most common symptom of uterine cancer. Women should not assume that abnormal vaginal bleeding is part of menopause. DIAGNOSIS  Your health care provider will ask about your medical history. He or she may also perform a number of procedures, such as:  A physical and pelvic exam. Your health care provider will feel your pelvis for any lumps.   Blood and urine tests.   X-rays.   Imaging tests, such as CT scans, ultrasonography, or MRIs.   A hysteroscopy to view the inside of your uterus.   A Pap test to sample cells from the cervix and upper vagina to check for abnormal cells.   Taking a tissue sample (biopsy) from the uterine lining to look for cancer cells.   A dilation and curettage (D&C). This involves stretching (dilation) the cervix and scraping (curettage) the inside lining of the uterus to get a tissue sample. The sample is examined under a microscope to look for cancer cells.  Your cancer will be staged to determine its severity and extent. Staging is a careful attempt to find out the size of the tumor, whether the cancer has spread, and if so, to what parts of the body. You may need to have more tests to determine the stage of your cancer. The test results will help determine what treatment plan is best for you. Cancer stages include:   Stage I. The cancer is only found in the uterus.  Stage II. The  cancer has spread to the cervix.  Stage III. The cancer has spread outside the uterus, but not outside the pelvis. The cancer may have spread to the lymph nodes in the pelvis.  Stage IV. The cancer has spread to other parts of the body, such as the bladder or rectum. TREATMENT  Most women with uterine cancer are treated with surgery. This includes removing the uterus, cervix, fallopian tubes, and ovaries (total hysterectomy). Your  lymph nodes near the tumor may also be removed. Some women have radiation, chemotherapy, or hormonal therapy. Other women have a combination of these therapies. HOME CARE INSTRUCTIONS   Take medicines only as directed by your health care provider.   Maintain a healthy diet.  Exercise regularly.   If you have diabetes, high blood pressure, thyroid disease, or gallbladder disease, follow your health care provider's instructions to keep it under control.   Do not smoke.   Consider joining a support group. This may help you learn to cope with the stress of having uterine cancer.   Seek advice to help you manage treatment side effects.   Keep all follow-up visits as directed by your health care provider.  SEEK MEDICAL CARE IF:  You have increased stomach or pelvic pain.  You cannot urinate.  You have abnormal bleeding. Document Released: 04/30/2005 Document Revised: 09/14/2013 Document Reviewed: 10/17/2012 [employer] Patient Information 2015 [place], [place]. This information is not intended to replace advice given to you by your health care provider. Make sure you discuss any questions you have with your health care provider.

## 2015-01-07 NOTE — Telephone Encounter (Signed)
Appointment 01/14/15 @ 10:30am with Dr.Rossi at [facility] cancer center pt aware.

## 2015-01-07 NOTE — Progress Notes (Signed)
   Patient is 69 year old was seen in the office on August 24 as part of her evaluation for postmenopausal bleeding. See previous note for details. Patient had a stenotic cervical os which required a paracervical block with serial dilators in an effort to proceed with endometrial biopsy and sonohysterogram. Patient had very friable vaginal sidewalls on contact. Patient is here to discuss pathology report as well as to manage her postmenopausal bleeding. She had been placed on Augmentin 875 mg 1 by mouth twice a day for 7 days in the event of a false passage underneath the cervix at time of attempting to identify the cervical os. I had shared these concerns with the patient at that time as well as today. Patient reports no fever. Patient's having normal bowel movements no problem with urination and she has noted no blood in her stool.  Her sonohysterogram on August 24 had demonstrated the following: Uterus measured 9.0 x 4.4 x 3.0 cm with endometrial stripe of 17 mm. The uterus was retroflexed prominent endometrial cavity with positive vascular flow. Right and left ovary were normal with normal echo pattern no fluid in the cul-de-sac.With a small lacrimal probe to identify the cervical os required serial dilatation with different dilators to facilitate insertion of the sterile uterine catheter whereby normal saline was instilled an intrauterine defect measuring 23 x 21 x 19 mm was noted.    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 Microscopic Comment There are fragments that are suggestive of involvement of polyp.  Exam: Blood pressure 130/78 Gen. appearance well-developed well-nourished female in no acute distress Back: No CVA tenderness Abdomen: Soft nontender no rebound or guarding Pelvic: Bartholin urethra Skene glands within normal  limits Vagina atrophic changes friable on contact some blood on vaginal sidewalls was noted Cervix: Some blood was noted at the external os Rectovaginal exam unremarkable no tenderness elicited no masses palpated  Fecal Hemoccult testing negative  Assessment/plan: 69 year old patient with postmenopausal bleeding endometrial biopsy 2 days ago demonstrated endometrial adenocarcinoma (FIGO grade 1) will be started on Megace 40 mg one by mouth twice a day for 10 days. Arrangements have been made for her to see the GYN oncologist next week. The patient has any problems between now and that appointment she'll contact the office. Instructions were provided. She'll finish the antibody. All questions answered.

## 2015-01-14 ENCOUNTER — Ambulatory Visit: Payer: Medicare HMO | Attending: Gynecologic Oncology | Admitting: Gynecologic Oncology

## 2015-01-14 ENCOUNTER — Encounter: Payer: Self-pay | Admitting: Gynecologic Oncology

## 2015-01-14 VITALS — BP 150/65 | HR 68 | Temp 98.3°F | Resp 18 | Ht 60.0 in | Wt 143.6 lb

## 2015-01-14 DIAGNOSIS — C541 Malignant neoplasm of endometrium: Secondary | ICD-10-CM | POA: Diagnosis not present

## 2015-01-14 NOTE — Progress Notes (Signed)
Consult Note: Gyn-Onc  Consult was requested by Dr. Toney Rakes for the evaluation of Emily Thompson 69 y.o. female  CC:  Chief Complaint  Patient presents with  . endometrial cancer    New consult    Assessment/Plan:  Emily Thompson  is a 69 y.o.  year old with grade 1 endometrial cancer (and a personal hx of melanoma and breast cancer).    A detailed discussion was held with the patient and her family with regard to to her endometrial cancer diagnosis. We discussed the standard management options for uterine cancer which includes surgery followed possibly by adjuvant therapy depending on the results of surgery. The options for surgical management include a hysterectomy and removal of the tubes and ovaries possibly with removal of pelvic and para-aortic lymph nodes. A minimally invasive approach including a robotic hysterectomy or laparoscopic hysterectomy have benefits including shorter hospital stay, recovery time and better wound healing. The alternative approach is an open hysterectomy. The patient has been counseled about these surgical options and the risks of surgery in general including infection, bleeding, damage to surrounding structures (including bowel, bladder, ureters, nerves or vessels), and the postoperative risks of PE/ DVT, and lymphedema. I extensively reviewed the additional risks of robotic hysterectomy including possible need for conversion to open laparotomy.  I discussed positioning during surgery of trendelenberg and risks of minor facial swelling and care we take in preoperative positioning.  After counseling and consideration of her options, she desires to proceed with robotic hysterectomy and BSO and sentinel lymph node biopsy.   She has a small cervix with an os that's difficult to access and specimen delivery may be challenging. She may need a minilaparotomy for specimen delivery.   She will be seen by anesthesia for preoperative clearance and discussion  of postoperative pain management.  She was given the opportunity to ask questions, which were answered to her satisfaction, and she is agreement with the above mentioned plan of care.   HPI: Emily Thompson is a very pleasant gravida 0 who is seen in consultation at the request of Dr. 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 The patient as a history of postmenopausal bleeding. She underwent sonohysterogram an ultrasound-guided endometrial biopsy. Dilation of the os was difficult and as was location of the cervical os.   Ultrasound revealed a 9 x 4.4 x 3 cm uterus with a 17 mm endometrial stripe. The uterus was retroverted. Ovaries appeared normal. 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  The patient has a personal history significant for history of breast cancer (stage I, treated with Dr. Marko Plume with lumpectomy, chemotherapy and radiation, tamoxifen 1 year, and Femara for additional time). This was diagnosed at age 26. She also has a history of a melanoma on her face. She denies other significant family history for malignancy with the exception of an aunt who had colon cancer at age 69.  She has a personal history of a cesarean section 1. She is otherwise healthy with history of palpitations and hypertension but only one prior to surgery (the cesarean section).  Current Meds:  Outpatient Encounter Prescriptions as of 01/14/2015  Medication Sig  . aspirin 81 MG tablet Take 81 mg by mouth daily.   . betamethasone dipropionate (DIPROLENE) 0.05 % cream Apply topically 2 (two) times daily. (Patient taking differently: Apply topically 2 (two) times daily as needed. )  . Calcium Citrate-Vitamin D (CITRACAL +  D PO) Take 1,200 mg by mouth daily.  Marland Kitchen diltiazem (CARDIZEM CD) 240 MG 24 hr capsule Take 1 capsule (240 mg total) by mouth daily.  Marland Kitchen ezetimibe (ZETIA) 10 MG tablet Take 1  tablet (10 mg total) by mouth daily.  . fluticasone (FLONASE) 50 MCG/ACT nasal spray Place 1 spray into both nostrils as needed for allergies or rhinitis.  . Multiple Vitamins-Minerals (PRESERVISION AREDS 2 PO) Take 1 capsule by mouth daily.  . ranitidine (ZANTAC) 75 MG tablet Take by mouth. 1 in the morning, 2 at night  . riboflavin (VITAMIN B-2) 100 MG TABS Take 200 mg by mouth 2 (two) times daily.   Marland Kitchen telmisartan (MICARDIS) 40 MG tablet Take 1 tablet (40 mg total) by mouth every morning.  . TURMERIC PO Take by mouth daily.  . megestrol (MEGACE) 40 MG tablet Take 1 tablet (40 mg total) by mouth 2 (two) times daily. (Patient not taking: Reported on 01/14/2015)  . [DISCONTINUED] amoxicillin-clavulanate (AUGMENTIN) 875-125 MG per tablet Take 1 tablet by mouth 2 (two) times daily.   No facility-administered encounter medications on file as of 01/14/2015.    Allergy:  Allergies  Allergen Reactions  . Dextromethorphan     Per pt, increases BP  . Statins     Headache, hot flashes  . Tape   . Morphine And Related     Social Hx:   Social History   Social History  . Marital Status: Widowed    Spouse Name: N/A  . Number of Children: 2  . Years of Education: BSN   Occupational History  . 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 Smoking status: Former Smoker -- 10 years    Types: Cigarettes    Quit date: 05/20/1973  . Smokeless tobacco: Never Used  . Alcohol Use: 2.4 oz/week    2 Glasses of wine, 2 Cans of beer per week     Comment: 4 A WEEK  . Drug Use: No  . Sexual Activity: Yes    Birth Control/ Protection: Post-menopausal   Other Topics Concern  . Not on file   Social History Narrative   Widow, 1 adopted and 1 natural child   Live by herself    Past Surgical Hx:  Past Surgical History  Procedure Laterality Date  . Breast lumpectomy Left 07/2001  . Breast reconstruction  2005  . Tubal ligation  1978  . Vulva /perineum biopsy       PAPILLOMA  . Cesarean section  1977  . Tonsillectomy and adenoidectomy  1952  . Nose surgery      basal cell carcinoma removed  . Basal cell carcinoma excision    . Skin cancer excision      melanoma  . Colonoscopy w/ polypectomy  2013    NEXT DUE IN 5 YRS  . Transthoracic echocardiogram  12/2005    EF 50-55%; mild MR; trace TR  . Nm myocar perf wall motion  01/2006    bruce myoview; no evidence of inducible ischemia, anterior wall thinning without evidence of ischemia; post-stress EF 88%; low risk scan     Past Medical Hx:  Past Medical History  Diagnosis Date  . GERD (gastroesophageal reflux disease)   . Osteopenia   . SVT (supraventricular tachycardia)     used to see Dr Rex Kras   . Dyslipidemia   . Hearing aid worn   . Hypertension   . Melanoma 2012  righ side face; basal cell (nose)   . BCC (basal cell carcinoma of skin) 2013, 2016  . Breast cancer 2003    chemo, radiation, lumpectomy, reconstructive surgery     Past Gynecological History:  C/S x 1  Patient's last menstrual period was 05/14/1997.  Family Hx:  Family History  Problem Relation Age of Onset  . Hypertension Mother   . Hypertension Father   . Stroke Father     hemorrhagic CVA at age 9  . Colon cancer Maternal Aunt     dx at age 86s  . Diabetes Brother   . Breast cancer Neg Hx   . Bipolar disorder Brother   . Thyroid disease Mother     M and B (?)    Review of Systems:  Constitutional  Feels well,    ENT Normal appearing ears and nares bilaterally Skin/Breast  No rash, sores, jaundice, itching, dryness Cardiovascular  No chest pain, shortness of breath, or edema  Pulmonary  No cough or wheeze.  Gastro Intestinal  No nausea, vomitting, or diarrhoea. No bright red blood per rectum, no abdominal pain, change in bowel movement, or constipation.  Genito Urinary  No frequency, urgency, dysuria, see HPI (postmenopausal bleeding) Musculo Skeletal  No myalgia, arthralgia, joint swelling or  pain  Neurologic  No weakness, numbness, change in gait,  Psychology  No depression, anxiety, insomnia.   Vitals:  Blood pressure 150/65, pulse 68, temperature 98.3 F (36.8 C), temperature source Oral, resp. rate 18, height 5' (1.524 m), weight 143 lb 9.6 oz (65.137 kg), last menstrual period 05/14/1997, SpO2 100 %.  Physical Exam: WD in NAD Neck  Supple NROM, without any enlargements.  Lymph Node Survey No cervical supraclavicular or inguinal adenopathy Cardiovascular  Pulse normal rate, regularity and rhythm. S1 and S2 normal.  Lungs  Clear to auscultation bilateraly, without wheezes/crackles/rhonchi. Good air movement.  Skin  No rash/lesions/breakdown  Psychiatry  Alert and oriented to person, place, and time  Abdomen  Normoactive bowel sounds, abdomen soft, non-tender and thin without evidence of hernia.  Back No CVA tenderness Genito Urinary  Vulva/vagina: Normal external female genitalia.  No lesions. No discharge or bleeding.  Bladder/urethra:  No lesions or masses, well supported bladder  Vagina: normal  Cervix: flush with vagina, small.  Uterus: Small, mobile, no parametrial involvement or nodularity.  Adnexa: no palpable masses. Rectal  Good tone, no masses no cul de sac nodularity.  Extremities  No bilateral cyanosis, clubbing or edema.   Donaciano Eva, MD  01/14/2015, 11:47 AM

## 2015-01-14 NOTE — Patient Instructions (Signed)
Preparing for your Surgery  Plan for surgery on September 15 with Dr. Everitt Amber. You are scheduled for a robotic assisted hysterectomy, bilateral salpingo oophorectomy, and sentinel node biopsy.   Pre-operative Testing -You will receive a phone call from presurgical testing at [facility] to arrange for a pre-operative testing appointment before your surgery.  This appointment normally occurs one to two weeks before your scheduled surgery.   -Bring your insurance card, copy of an advanced directive if applicable, medication list  -At that visit, you will be asked to sign a consent for a possible blood transfusion in case a transfusion becomes necessary during surgery.  The need for a blood transfusion is rare but having consent is a necessary part of your care.     -You should not be taking blood thinners or aspirin at least ten days prior to surgery unless instructed by your surgeon.  Day Before Surgery at [facility] will be asked to take in only clear liquids the day before surgery.  Examples of clear liquids include broths, jello, and clear juices.  Avoid carbonated beverages. You will be advised to have nothing to eat or drink after midnight the evening before.    Your role in recovery Your role is to become active as soon as directed by your doctor, while still giving yourself time to heal.  Rest when you feel tired. You will be asked to do the following in order to speed your recovery:  - Cough and breathe deeply. This helps toclear and expand your lungs and can prevent pneumonia. You may be given a spirometer to practice deep breathing. A staff member will show you how to use the spirometer. - Do mild physical activity. Walking or moving your legs help your circulation and body functions return to normal. A staff member will help you when you try to walk and will provide you with simple exercises. Do not try to get up or walk alone the first time. - Actively manage  your pain. Managing your pain lets you move in comfort. We will ask you to rate your pain on a scale of zero to 10. It is your responsibility to tell your doctor or nurse where and how much you hurt so your pain can be treated.  Special Considerations -If you are diabetic, you may be placed on insulin after surgery to have closer control over your blood sugars to promote healing and recovery.  This does not mean that you will be discharged on insulin.  If applicable, your oral antidiabetics will be resumed when you are tolerating a solid diet.  -Your final pathology results from surgery should be available by the Friday after surgery and the results will be relayed to you when available.  Blood Transfusion Information WHAT IS A BLOOD TRANSFUSION? A transfusion is the replacement of blood or some of its parts. Blood is made up of multiple cells which provide different functions.  Red blood cells carry oxygen and are used for blood loss replacement.  White blood cells fight against infection.  Platelets control bleeding.  Plasma helps clot blood.  Other blood products are available for specialized needs, such as hemophilia or other clotting disorders. BEFORE THE TRANSFUSION  Who gives blood for transfusions?   You may be able to donate blood to be used at a later date on yourself (autologous donation).  Relatives can be asked to donate blood. This is generally not any safer than if you have received blood from a stranger.  The same precautions are taken to ensure safety when a relative's blood is donated.  Healthy volunteers who are fully evaluated to make sure their blood is safe. This is blood bank blood. Transfusion therapy is the safest it has ever been in the practice of medicine. Before blood is taken from a donor, a complete history is taken to make sure that person has no history of diseases nor engages in risky social behavior (examples are intravenous drug use or sexual activity  with multiple partners). The donor's travel history is screened to minimize risk of transmitting infections, such as malaria. The donated blood is tested for signs of infectious diseases, such as HIV and hepatitis. The blood is then tested to be sure it is compatible with you in order to minimize the chance of a transfusion reaction. If you or a relative donates blood, this is often done in anticipation of surgery and is not appropriate for emergency situations. It takes many days to process the donated blood. RISKS AND COMPLICATIONS Although transfusion therapy is very safe and saves many lives, the main dangers of transfusion include:   Getting an infectious disease.  Developing a transfusion reaction. This is an allergic reaction to something in the blood you were given. Every precaution is taken to prevent this. The decision to have a blood transfusion has been considered carefully by your caregiver before blood is given. Blood is not given unless the benefits outweigh the risks.

## 2015-01-18 NOTE — Progress Notes (Signed)
Please put orders in Epic surgery 01-27-15 pre op 01-25-15  Thanks

## 2015-01-24 NOTE — Patient Instructions (Addendum)
YOUR PROCEDURE IS SCHEDULED ON :  01/27/15  REPORT TO [facility] MAIN ENTRANCE FOLLOW SIGNS TO EAST ELEVATOR - GO TO 3rd FLOOR CHECK IN AT 3 EAST NURSES STATION (SHORT STAY) AT:  11:00 AM  CALL THIS NUMBER IF YOU HAVE PROBLEMS THE MORNING OF SURGERY [phone]  REMEMBER:ONLY 1 PER PERSON MAY GO TO SHORT STAY WITH YOU TO GET READY THE MORNING OF YOUR SURGERY  DO NOT EAT FOOD OR DRINK LIQUIDS AFTER MIDNIGHT  CLEAR LIQUIDS ONLY ON THE DAY BEFORE SURGERY  TAKE THESE MEDICINES THE MORNING OF SURGERY: DILTIAZEM / ZANTAC     CLEAR LIQUID DIET   Foods Allowed                                                                     Foods Excluded  Coffee and tea, regular and decaf                             liquids that you cannot  Plain Jell-O in any flavor                                             see through such as: Fruit ices (not with fruit pulp)                                     milk, soups, orange juice  Iced Popsicles                                                  All solid food Carbonated beverages, regular and diet                                    Cranberry, grape and apple juices Sports drinks like Gatorade Lightly seasoned clear broth or consume(fat free) Sugar, honey syrup  _____________________________________________________________________    YOU MAY NOT HAVE ANY METAL ON YOUR BODY INCLUDING HAIR PINS AND PIERCING'S. DO NOT WEAR JEWELRY, MAKEUP, LOTIONS, POWDERS OR PERFUMES. DO NOT WEAR NAIL POLISH. DO NOT SHAVE 48 HRS PRIOR TO SURGERY. MEN MAY SHAVE FACE AND NECK.  DO NOT [place]. [facility] IS NOT RESPONSIBLE FOR VALUABLES.  CONTACTS, DENTURES OR PARTIALS MAY NOT BE WORN TO SURGERY. LEAVE SUITCASE IN CAR. CAN BE BROUGHT TO ROOM AFTER SURGERY.  PATIENTS DISCHARGED THE DAY OF SURGERY WILL NOT BE ALLOWED TO DRIVE HOME.  PLEASE READ OVER THE FOLLOWING INSTRUCTION  SHEETS _________________________________________________________________________________                                          [facility] - PREPARING FOR SURGERY  Before surgery, you can play an  important role.  Because skin is not sterile, your skin needs to be as free of germs as possible.  You can reduce the number of germs on your skin by washing with CHG (chlorahexidine gluconate) soap before surgery.  CHG is an antiseptic cleaner which kills germs and bonds with the skin to continue killing germs even after washing. Please DO NOT use if you have an allergy to CHG or antibacterial soaps.  If your skin becomes reddened/irritated stop using the CHG and inform your nurse when you arrive at Short Stay. Do not shave (including legs and underarms) for at least 48 hours prior to the first CHG shower.  You may shave your face. Please follow these instructions carefully:   1.  Shower with CHG Soap the night before surgery and the  morning of Surgery.   2.  If you choose to wash your hair, wash your hair first as usual with your  normal  Shampoo.   3.  After you shampoo, rinse your hair and body thoroughly to remove the  shampoo.                                         4.  Use CHG as you would any other liquid soap.  You can apply chg directly  to the skin and wash . Gently wash with scrungie or clean wascloth    5.  Apply the CHG Soap to your body ONLY FROM THE NECK DOWN.   Do not use on open                           Wound or open sores. Avoid contact with eyes, ears mouth and genitals (private parts).                        Genitals (private parts) with your normal soap.              6.  Wash thoroughly, paying special attention to the area where your surgery  will be performed.   7.  Thoroughly rinse your body with warm water from the neck down.   8.  DO NOT shower/wash with your normal soap after using and rinsing off  the CHG Soap .                9.  Pat yourself dry with a clean  towel.             10.  Wear clean night clothes to bed after shower             11.  Place clean sheets on your bed the night of your first shower and do not  sleep with pets.  Day of Surgery : Do not apply any lotions/deodorants the morning of surgery.  Please wear clean clothes to the hospital/surgery center.  FAILURE TO FOLLOW THESE INSTRUCTIONS MAY RESULT IN THE CANCELLATION OF YOUR SURGERY    PATIENT SIGNATURE_________________________________  ______________________________________________________________________     Emily Thompson  An incentive spirometer is a tool that can help keep your lungs clear and active. This tool measures how well you are filling your lungs with each breath. Taking long deep breaths may help reverse or decrease the chance of developing breathing (pulmonary) problems (especially infection) following:  A long period of time when you are  unable to move or be active. BEFORE THE PROCEDURE   If the spirometer includes an indicator to show your best effort, your nurse or respiratory therapist will set it to a desired goal.  If possible, sit up straight or lean slightly forward. Try not to slouch.  Hold the incentive spirometer in an upright position. INSTRUCTIONS FOR USE   Sit on the edge of your bed if possible, or sit up as far as you can in bed or on a chair.  Hold the incentive spirometer in an upright position.  Breathe out normally.  Place the mouthpiece in your mouth and seal your lips tightly around it.  Breathe in slowly and as deeply as possible, raising the piston or the ball toward the top of the column.  Hold your breath for 3-5 seconds or for as long as possible. Allow the piston or ball to fall to the bottom of the column.  Remove the mouthpiece from your mouth and breathe out normally.  Rest for a few seconds and repeat Steps 1 through 7 at least 10 times every 1-2 hours when you are awake. Take your time and take a few  normal breaths between deep breaths.  The spirometer may include an indicator to show your best effort. Use the indicator as a goal to work toward during each repetition.  After each set of 10 deep breaths, practice coughing to be sure your lungs are clear. If you have an incision (the cut made at the time of surgery), support your incision when coughing by placing a pillow or rolled up towels firmly against it. Once you are able to get out of bed, walk around indoors and cough well. You may stop using the incentive spirometer when instructed by your caregiver.  RISKS AND COMPLICATIONS  Take your time so you do not get dizzy or light-headed.  If you are in pain, you may need to take or ask for pain medication before doing incentive spirometry. It is harder to take a deep breath if you are having pain. AFTER USE  Rest and breathe slowly and easily.  It can be helpful to keep track of a log of your progress. Your caregiver can provide you with a simple table to help with this. If you are using the spirometer at home, follow these instructions: [place] IF:   You are having difficultly using the spirometer.  You have trouble using the spirometer as often as instructed.  Your pain medication is not giving enough relief while using the spirometer.  You develop fever of 100.5 F (38.1 C) or higher. SEEK IMMEDIATE MEDICAL CARE IF:   You cough up bloody sputum that had not been present before.  You develop fever of 102 F (38.9 C) or greater.  You develop worsening pain at or near the incision site. MAKE SURE YOU:   Understand these instructions.  Will watch your condition.  Will get help right away if you are not doing well or get worse. Document Released: 09/10/2006 Document Revised: 07/23/2011 Document Reviewed: 11/11/2006 Exit[employer] Patient Information 2014 [employer], [place].   ________________________________________________________________________  WHAT IS A BLOOD  TRANSFUSION? Blood Transfusion Information  A transfusion is the replacement of blood or some of its parts. Blood is made up of multiple cells which provide different functions.  Red blood cells carry oxygen and are used for blood loss replacement.  White blood cells fight against infection.  Platelets control bleeding.  Plasma helps clot blood.  Other blood products  are available for specialized needs, such as hemophilia or other clotting disorders. BEFORE THE TRANSFUSION  Who gives blood for transfusions?   Healthy volunteers who are fully evaluated to make sure their blood is safe. This is blood bank blood. Transfusion therapy is the safest it has ever been in the practice of medicine. Before blood is taken from a donor, a complete history is taken to make sure that person has no history of diseases nor engages in risky social behavior (examples are intravenous drug use or sexual activity with multiple partners). The donor's travel history is screened to minimize risk of transmitting infections, such as malaria. The donated blood is tested for signs of infectious diseases, such as HIV and hepatitis. The blood is then tested to be sure it is compatible with you in order to minimize the chance of a transfusion reaction. If you or a relative donates blood, this is often done in anticipation of surgery and is not appropriate for emergency situations. It takes many days to process the donated blood. RISKS AND COMPLICATIONS Although transfusion therapy is very safe and saves many lives, the main dangers of transfusion include:   Getting an infectious disease.  Developing a transfusion reaction. This is an allergic reaction to something in the blood you were given. Every precaution is taken to prevent this. The decision to have a blood transfusion has been considered carefully by your caregiver before blood is given. Blood is not given unless the benefits outweigh the risks. AFTER THE  TRANSFUSION  Right after receiving a blood transfusion, you will usually feel much better and more energetic. This is especially true if your red blood cells have gotten low (anemic). The transfusion raises the level of the red blood cells which carry oxygen, and this usually causes an energy increase.  The nurse administering the transfusion will monitor you carefully for complications. HOME CARE INSTRUCTIONS  No special instructions are needed after a transfusion. You may find your energy is better. Speak with your caregiver about any limitations on activity for underlying diseases you may have. SEEK MEDICAL CARE IF:   Your condition is not improving after your transfusion.  You develop redness or irritation at the intravenous (IV) site. SEEK IMMEDIATE MEDICAL CARE IF:  Any of the following symptoms occur over the next 12 hours:  Shaking chills.  You have a temperature by mouth above 102 F (38.9 C), not controlled by medicine.  Chest, back, or muscle pain.  People around you feel you are not acting correctly or are confused.  Shortness of breath or difficulty breathing.  Dizziness and fainting.  You get a rash or develop hives.  You have a decrease in urine output.  Your urine turns a dark color or changes to pink, red, or brown. Any of the following symptoms occur over the next 10 days:  You have a temperature by mouth above 102 F (38.9 C), not controlled by medicine.  Shortness of breath.  Weakness after normal activity.  The white part of the eye turns yellow (jaundice).  You have a decrease in the amount of urine or are urinating less often.  Your urine turns a dark color or changes to pink, red, or brown. Document Released: 04/27/2000 Document Revised: 07/23/2011 Document Reviewed: 12/15/2007 [facility] Patient Information 2014 [place], [place].  _______________________________________________________________________

## 2015-01-25 ENCOUNTER — Encounter (HOSPITAL_COMMUNITY): Payer: Self-pay

## 2015-01-25 ENCOUNTER — Ambulatory Visit (HOSPITAL_COMMUNITY)
Admission: RE | Admit: 2015-01-25 | Discharge: 2015-01-25 | Disposition: A | Discharge status: Home / Self Care | Payer: Medicare HMO | Source: Ambulatory Visit | Attending: Gynecologic Oncology | Admitting: Gynecologic Oncology

## 2015-01-25 ENCOUNTER — Encounter (HOSPITAL_COMMUNITY)
Admission: RE | Admit: 2015-01-25 | Discharge: 2015-01-25 | Disposition: A | Discharge status: Home / Self Care | Payer: Medicare HMO | Source: Ambulatory Visit | Attending: Gynecologic Oncology | Admitting: Gynecologic Oncology

## 2015-01-25 DIAGNOSIS — Z01818 Encounter for other preprocedural examination: Secondary | ICD-10-CM | POA: Diagnosis not present

## 2015-01-25 DIAGNOSIS — C541 Malignant neoplasm of endometrium: Secondary | ICD-10-CM | POA: Diagnosis not present

## 2015-01-25 HISTORY — DX: Malignant neoplasm of endometrium: C54.1

## 2015-01-25 HISTORY — DX: Nausea with vomiting, unspecified: R11.2

## 2015-01-25 HISTORY — DX: Other complications of anesthesia, initial encounter: T88.59XA

## 2015-01-25 HISTORY — DX: Other specified postprocedural states: Z98.890

## 2015-01-25 HISTORY — DX: Adverse effect of unspecified anesthetic, initial encounter: T41.45XA

## 2015-01-25 LAB — CBC WITH DIFFERENTIAL/PLATELET
Basophils Absolute: 0.1 10*3/uL (ref 0.0–0.1)
Basophils Relative: 1 % (ref 0–1)
Eosinophils Absolute: 0.2 10*3/uL (ref 0.0–0.7)
Eosinophils Relative: 2 % (ref 0–5)
HCT: 41.7 % (ref 36.0–46.0)
Hemoglobin: 13.7 g/dL (ref 12.0–15.0)
Lymphocytes Relative: 12 % (ref 12–46)
Lymphs Abs: 1.1 10*3/uL (ref 0.7–4.0)
MCH: 30.2 pg (ref 26.0–34.0)
MCHC: 32.9 g/dL (ref 30.0–36.0)
MCV: 91.9 fL (ref 78.0–100.0)
Monocytes Absolute: 1.1 10*3/uL — ABNORMAL HIGH (ref 0.1–1.0)
Monocytes Relative: 13 % — ABNORMAL HIGH (ref 3–12)
Neutro Abs: 6.4 10*3/uL (ref 1.7–7.7)
Neutrophils Relative %: 72 % (ref 43–77)
Platelets: 267 10*3/uL (ref 150–400)
RBC: 4.54 MIL/uL (ref 3.87–5.11)
RDW: 13.1 % (ref 11.5–15.5)
WBC: 8.8 10*3/uL (ref 4.0–10.5)

## 2015-01-25 LAB — COMPREHENSIVE METABOLIC PANEL
ALT: 15 U/L (ref 14–54)
AST: 21 U/L (ref 15–41)
Albumin: 4.2 g/dL (ref 3.5–5.0)
Alkaline Phosphatase: 76 U/L (ref 38–126)
Anion gap: 7 (ref 5–15)
BUN: 14 mg/dL (ref 6–20)
CO2: 27 mmol/L (ref 22–32)
Calcium: 9.8 mg/dL (ref 8.9–10.3)
Chloride: 106 mmol/L (ref 101–111)
Creatinine, Ser: 0.79 mg/dL (ref 0.44–1.00)
GFR calc Af Amer: >60 mL/min (ref >60)
GFR calc non Af Amer: >60 mL/min (ref >60)
Glucose, Bld: 92 mg/dL (ref 65–99)
Potassium: 4.5 mmol/L (ref 3.5–5.1)
Sodium: 140 mmol/L (ref 135–145)
Total Bilirubin: 0.7 mg/dL (ref 0.3–1.2)
Total Protein: 6.9 g/dL (ref 6.5–8.1)

## 2015-01-25 LAB — URINALYSIS, ROUTINE W REFLEX MICROSCOPIC
Bilirubin Urine: NEGATIVE
Glucose, UA: NEGATIVE mg/dL
Hgb urine dipstick: NEGATIVE
Ketones, ur: NEGATIVE mg/dL
Leukocytes, UA: NEGATIVE
Nitrite: NEGATIVE
Protein, ur: NEGATIVE mg/dL
Specific Gravity, Urine: 1.016 (ref 1.005–1.030)
Urobilinogen, UA: 0.2 mg/dL (ref 0.0–1.0)
pH: 6 (ref 5.0–8.0)

## 2015-01-25 LAB — ABO/RH: ABO/RH(D): O POS

## 2015-01-27 ENCOUNTER — Ambulatory Visit (HOSPITAL_COMMUNITY): Payer: Medicare HMO | Admitting: Certified Registered Nurse Anesthetist

## 2015-01-27 ENCOUNTER — Ambulatory Visit (HOSPITAL_COMMUNITY)
Admission: RE | Admit: 2015-01-27 | Discharge: 2015-01-28 | Disposition: A | Discharge status: Home / Self Care | Payer: Medicare HMO | Source: Ambulatory Visit | Attending: Gynecologic Oncology | Admitting: Gynecologic Oncology

## 2015-01-27 ENCOUNTER — Encounter (HOSPITAL_COMMUNITY): Payer: Self-pay | Admitting: *Deleted

## 2015-01-27 ENCOUNTER — Encounter (HOSPITAL_COMMUNITY)
Admission: RE | Disposition: A | Discharge status: Home / Self Care | Payer: Self-pay | Source: Ambulatory Visit | Attending: Gynecologic Oncology

## 2015-01-27 DIAGNOSIS — M1990 Unspecified osteoarthritis, unspecified site: Secondary | ICD-10-CM | POA: Diagnosis not present

## 2015-01-27 DIAGNOSIS — Z853 Personal history of malignant neoplasm of breast: Secondary | ICD-10-CM | POA: Diagnosis not present

## 2015-01-27 DIAGNOSIS — Z7982 Long term (current) use of aspirin: Secondary | ICD-10-CM | POA: Diagnosis not present

## 2015-01-27 DIAGNOSIS — Z800 Family history of malignant neoplasm of digestive organs: Secondary | ICD-10-CM | POA: Diagnosis not present

## 2015-01-27 DIAGNOSIS — D259 Leiomyoma of uterus, unspecified: Secondary | ICD-10-CM | POA: Insufficient documentation

## 2015-01-27 DIAGNOSIS — N800 Endometriosis of uterus: Secondary | ICD-10-CM | POA: Insufficient documentation

## 2015-01-27 DIAGNOSIS — Z87891 Personal history of nicotine dependence: Secondary | ICD-10-CM | POA: Insufficient documentation

## 2015-01-27 DIAGNOSIS — Z923 Personal history of irradiation: Secondary | ICD-10-CM | POA: Diagnosis not present

## 2015-01-27 DIAGNOSIS — I471 Supraventricular tachycardia: Secondary | ICD-10-CM | POA: Insufficient documentation

## 2015-01-27 DIAGNOSIS — Z79899 Other long term (current) drug therapy: Secondary | ICD-10-CM | POA: Insufficient documentation

## 2015-01-27 DIAGNOSIS — Z7951 Long term (current) use of inhaled steroids: Secondary | ICD-10-CM | POA: Diagnosis not present

## 2015-01-27 DIAGNOSIS — C541 Malignant neoplasm of endometrium: Secondary | ICD-10-CM | POA: Diagnosis present

## 2015-01-27 DIAGNOSIS — Z9221 Personal history of antineoplastic chemotherapy: Secondary | ICD-10-CM | POA: Diagnosis not present

## 2015-01-27 DIAGNOSIS — C55 Malignant neoplasm of uterus, part unspecified: Secondary | ICD-10-CM

## 2015-01-27 DIAGNOSIS — Z85828 Personal history of other malignant neoplasm of skin: Secondary | ICD-10-CM | POA: Insufficient documentation

## 2015-01-27 DIAGNOSIS — E785 Hyperlipidemia, unspecified: Secondary | ICD-10-CM | POA: Diagnosis not present

## 2015-01-27 DIAGNOSIS — K219 Gastro-esophageal reflux disease without esophagitis: Secondary | ICD-10-CM | POA: Insufficient documentation

## 2015-01-27 DIAGNOSIS — I10 Essential (primary) hypertension: Secondary | ICD-10-CM | POA: Diagnosis not present

## 2015-01-27 DIAGNOSIS — Z85820 Personal history of malignant melanoma of skin: Secondary | ICD-10-CM | POA: Diagnosis not present

## 2015-01-27 HISTORY — PX: ROBOTIC ASSISTED TOTAL HYSTERECTOMY WITH BILATERAL SALPINGO OOPHERECTOMY: SHX6086

## 2015-01-27 LAB — TYPE AND SCREEN
ABO/RH(D): O POS
Antibody Screen: NEGATIVE

## 2015-01-27 SURGERY — ROBOTIC ASSISTED TOTAL HYSTERECTOMY WITH BILATERAL SALPINGO OOPHORECTOMY
Anesthesia: General | Laterality: Bilateral

## 2015-01-27 MED ORDER — HYDROMORPHONE HCL 2 MG/ML IJ SOLN
INTRAMUSCULAR | Status: AC
  Filled 2015-01-27: qty 1

## 2015-01-27 MED ORDER — FENTANYL CITRATE (PF) 100 MCG/2ML IJ SOLN
INTRAMUSCULAR | Status: AC
  Filled 2015-01-27: qty 2

## 2015-01-27 MED ORDER — FAMOTIDINE 10 MG PO TABS
10.0000 mg | ORAL_TABLET | Freq: Two times a day (BID) | ORAL | Status: DC
Start: 2015-01-27 — End: 2015-01-28
  Administered 2015-01-27 – 2015-01-28 (×2): 10 mg via ORAL
  Filled 2015-01-27 (×3): qty 1

## 2015-01-27 MED ORDER — EZETIMIBE 10 MG PO TABS
10.0000 mg | ORAL_TABLET | Freq: Every day | ORAL | Status: DC
  Filled 2015-01-27: qty 1

## 2015-01-27 MED ORDER — GLYCOPYRROLATE 0.2 MG/ML IJ SOLN
INTRAMUSCULAR | Status: DC | PRN
  Administered 2015-01-27: 0.4 mg via INTRAVENOUS

## 2015-01-27 MED ORDER — LIDOCAINE HCL (CARDIAC) 20 MG/ML IV SOLN
INTRAVENOUS | Status: DC | PRN
  Administered 2015-01-27: 80 mg via INTRAVENOUS

## 2015-01-27 MED ORDER — DILTIAZEM HCL ER COATED BEADS 240 MG PO CP24
240.0000 mg | ORAL_CAPSULE | Freq: Every day | ORAL | Status: DC
  Administered 2015-01-28: 240 mg via ORAL
  Filled 2015-01-27: qty 1

## 2015-01-27 MED ORDER — PHENYLEPHRINE 40 MCG/ML (10ML) SYRINGE FOR IV PUSH (FOR BLOOD PRESSURE SUPPORT)
PREFILLED_SYRINGE | INTRAVENOUS | Status: AC
  Filled 2015-01-27: qty 10

## 2015-01-27 MED ORDER — STERILE WATER FOR INJECTION IJ SOLN
INTRAMUSCULAR | Status: DC | PRN
  Administered 2015-01-27: 4 mL

## 2015-01-27 MED ORDER — KCL IN DEXTROSE-NACL 20-5-0.45 MEQ/L-%-% IV SOLN
INTRAVENOUS | Status: DC
  Administered 2015-01-27: 18:00:00 via INTRAVENOUS
  Filled 2015-01-27 (×2): qty 1000

## 2015-01-27 MED ORDER — ONDANSETRON HCL 4 MG/2ML IJ SOLN
INTRAMUSCULAR | Status: DC | PRN
  Administered 2015-01-27: 4 mg via INTRAVENOUS

## 2015-01-27 MED ORDER — HYDROMORPHONE HCL 1 MG/ML IJ SOLN: 0.2000 mg | INTRAMUSCULAR | Status: DC | PRN

## 2015-01-27 MED ORDER — EPHEDRINE SULFATE 50 MG/ML IJ SOLN
INTRAMUSCULAR | Status: DC | PRN
  Administered 2015-01-27: 5 mg via INTRAVENOUS

## 2015-01-27 MED ORDER — METOCLOPRAMIDE HCL 5 MG/ML IJ SOLN
INTRAMUSCULAR | Status: AC
  Filled 2015-01-27: qty 2

## 2015-01-27 MED ORDER — SUCCINYLCHOLINE CHLORIDE 20 MG/ML IJ SOLN
INTRAMUSCULAR | Status: DC | PRN
  Administered 2015-01-27: 100 mg via INTRAVENOUS

## 2015-01-27 MED ORDER — ONDANSETRON HCL 4 MG/2ML IJ SOLN
4.0000 mg | Freq: Four times a day (QID) | INTRAMUSCULAR | Status: DC | PRN
  Administered 2015-01-27: 4 mg via INTRAVENOUS
  Filled 2015-01-27: qty 2

## 2015-01-27 MED ORDER — GLYCOPYRROLATE 0.2 MG/ML IJ SOLN
INTRAMUSCULAR | Status: AC
  Filled 2015-01-27: qty 1

## 2015-01-27 MED ORDER — ROCURONIUM BROMIDE 100 MG/10ML IV SOLN
INTRAVENOUS | Status: DC | PRN
  Administered 2015-01-27: 10 mg via INTRAVENOUS
  Administered 2015-01-27: 30 mg via INTRAVENOUS

## 2015-01-27 MED ORDER — NEOSTIGMINE METHYLSULFATE 10 MG/10ML IV SOLN
INTRAVENOUS | Status: DC | PRN
  Administered 2015-01-27: 3 mg via INTRAVENOUS

## 2015-01-27 MED ORDER — ENOXAPARIN SODIUM 40 MG/0.4ML ~~LOC~~ SOLN
40.0000 mg | SUBCUTANEOUS | Status: DC
  Administered 2015-01-28: 40 mg via SUBCUTANEOUS
  Filled 2015-01-27 (×2): qty 0.4

## 2015-01-27 MED ORDER — NEOSTIGMINE METHYLSULFATE 10 MG/10ML IV SOLN
INTRAVENOUS | Status: AC
  Filled 2015-01-27: qty 1

## 2015-01-27 MED ORDER — METOCLOPRAMIDE HCL 5 MG/ML IJ SOLN
INTRAMUSCULAR | Status: DC | PRN
  Administered 2015-01-27: 10 mg via INTRAVENOUS

## 2015-01-27 MED ORDER — IBUPROFEN 800 MG PO TABS
800.0000 mg | ORAL_TABLET | Freq: Three times a day (TID) | ORAL | Status: DC | PRN
  Administered 2015-01-28: 800 mg via ORAL
  Filled 2015-01-27: qty 1

## 2015-01-27 MED ORDER — HYDROMORPHONE HCL 1 MG/ML IJ SOLN
INTRAMUSCULAR | Status: DC | PRN
  Administered 2015-01-27 (×4): 0.5 mg via INTRAVENOUS

## 2015-01-27 MED ORDER — GLYCOPYRROLATE 0.2 MG/ML IJ SOLN
INTRAMUSCULAR | Status: AC
  Filled 2015-01-27: qty 3

## 2015-01-27 MED ORDER — FENTANYL CITRATE (PF) 100 MCG/2ML IJ SOLN
INTRAMUSCULAR | Status: DC | PRN
  Administered 2015-01-27 (×5): 50 ug via INTRAVENOUS

## 2015-01-27 MED ORDER — CEFAZOLIN SODIUM-DEXTROSE 2-3 GM-% IV SOLR
2.0000 g | INTRAVENOUS | Status: AC
  Administered 2015-01-27: 2 g via INTRAVENOUS

## 2015-01-27 MED ORDER — STERILE WATER FOR IRRIGATION IR SOLN
Status: DC | PRN
  Administered 2015-01-27: 1000 mL

## 2015-01-27 MED ORDER — IRBESARTAN 150 MG PO TABS
150.0000 mg | ORAL_TABLET | Freq: Every day | ORAL | Status: DC
Start: 2015-01-28 — End: 2015-01-28
  Filled 2015-01-27: qty 1

## 2015-01-27 MED ORDER — FENTANYL CITRATE (PF) 250 MCG/5ML IJ SOLN
INTRAMUSCULAR | Status: AC
  Filled 2015-01-27: qty 25

## 2015-01-27 MED ORDER — CEFAZOLIN SODIUM-DEXTROSE 2-3 GM-% IV SOLR
INTRAVENOUS | Status: AC
  Filled 2015-01-27: qty 50

## 2015-01-27 MED ORDER — ASPIRIN 81 MG PO CHEW
81.0000 mg | CHEWABLE_TABLET | Freq: Every day | ORAL | Status: DC
  Administered 2015-01-28: 81 mg via ORAL
  Filled 2015-01-27: qty 1

## 2015-01-27 MED ORDER — DEXAMETHASONE SODIUM PHOSPHATE 10 MG/ML IJ SOLN
INTRAMUSCULAR | Status: AC
  Filled 2015-01-27: qty 1

## 2015-01-27 MED ORDER — MIDAZOLAM HCL 5 MG/5ML IJ SOLN
INTRAMUSCULAR | Status: DC | PRN
  Administered 2015-01-27: 1 mg via INTRAVENOUS

## 2015-01-27 MED ORDER — PROPOFOL 10 MG/ML IV BOLUS
INTRAVENOUS | Status: AC
Start: 2015-01-27 — End: 2015-01-27
  Filled 2015-01-27: qty 20

## 2015-01-27 MED ORDER — OXYCODONE-ACETAMINOPHEN 5-325 MG PO TABS
1.0000 | ORAL_TABLET | ORAL | Status: DC | PRN
  Administered 2015-01-27 – 2015-01-28 (×3): 1 via ORAL
  Filled 2015-01-27: qty 2
  Filled 2015-01-27 (×2): qty 1

## 2015-01-27 MED ORDER — MIDAZOLAM HCL 2 MG/2ML IJ SOLN
INTRAMUSCULAR | Status: AC
  Filled 2015-01-27: qty 4

## 2015-01-27 MED ORDER — PROPOFOL 10 MG/ML IV BOLUS
INTRAVENOUS | Status: DC | PRN
  Administered 2015-01-27: 110 mg via INTRAVENOUS

## 2015-01-27 MED ORDER — ONDANSETRON HCL 4 MG/2ML IJ SOLN: 4.0000 mg | Freq: Once | INTRAMUSCULAR | Status: DC | PRN

## 2015-01-27 MED ORDER — ROCURONIUM BROMIDE 100 MG/10ML IV SOLN
INTRAVENOUS | Status: AC
  Filled 2015-01-27: qty 1

## 2015-01-27 MED ORDER — LIDOCAINE HCL (CARDIAC) 20 MG/ML IV SOLN
INTRAVENOUS | Status: AC
  Filled 2015-01-27: qty 5

## 2015-01-27 MED ORDER — ONDANSETRON HCL 4 MG/2ML IJ SOLN
INTRAMUSCULAR | Status: AC
  Filled 2015-01-27: qty 2

## 2015-01-27 MED ORDER — STERILE WATER FOR INJECTION IJ SOLN
INTRAMUSCULAR | Status: AC
  Filled 2015-01-27: qty 10

## 2015-01-27 MED ORDER — LACTATED RINGERS IV SOLN
INTRAVENOUS | Status: DC
Start: 2015-01-27 — End: 2015-01-27
  Administered 2015-01-27: 16:00:00 via INTRAVENOUS
  Administered 2015-01-27: 1000 mL via INTRAVENOUS

## 2015-01-27 MED ORDER — LACTATED RINGERS IR SOLN
Status: DC | PRN
  Administered 2015-01-27: 1000 mL

## 2015-01-27 MED ORDER — DEXAMETHASONE SODIUM PHOSPHATE 10 MG/ML IJ SOLN
INTRAMUSCULAR | Status: DC | PRN
  Administered 2015-01-27: 5 mg via INTRAVENOUS

## 2015-01-27 MED ORDER — FENTANYL CITRATE (PF) 100 MCG/2ML IJ SOLN
25.0000 ug | INTRAMUSCULAR | Status: DC | PRN
  Administered 2015-01-27 (×2): 50 ug via INTRAVENOUS

## 2015-01-27 MED ORDER — ONDANSETRON HCL 4 MG PO TABS: 4.0000 mg | ORAL_TABLET | Freq: Four times a day (QID) | ORAL | Status: DC | PRN

## 2015-01-27 MED ORDER — KCL IN DEXTROSE-NACL 20-5-0.45 MEQ/L-%-% IV SOLN
INTRAVENOUS | Status: AC
  Filled 2015-01-27: qty 1000

## 2015-01-27 MED ORDER — ENOXAPARIN SODIUM 40 MG/0.4ML ~~LOC~~ SOLN
40.0000 mg | SUBCUTANEOUS | Status: AC
  Administered 2015-01-27: 40 mg via SUBCUTANEOUS
  Filled 2015-01-27: qty 0.4

## 2015-01-27 SURGICAL SUPPLY — 62 items
BAG SPEC RTRVL LRG 6X4 10 (ENDOMECHANICALS)
CABLE HIGH FREQUENCY MONO STRZ (ELECTRODE) ×1 IMPLANT
CHLORAPREP W/TINT 26ML (MISCELLANEOUS) ×3 IMPLANT
CORDS BIPOLAR (ELECTRODE) ×1 IMPLANT
COVER SURGICAL LIGHT HANDLE (MISCELLANEOUS) ×1 IMPLANT
COVER TIP SHEARS 8 DVNC (MISCELLANEOUS) ×2 IMPLANT
COVER TIP SHEARS 8MM DA VINCI (MISCELLANEOUS) ×1
DRAPE ARM DVNC X/XI (DISPOSABLE) ×4 IMPLANT
DRAPE COLUMN DVNC XI (DISPOSABLE) ×1 IMPLANT
DRAPE DA VINCI XI ARM (DISPOSABLE) ×4
DRAPE DA VINCI XI COLUMN (DISPOSABLE) ×1
DRAPE SHEET LG 3/4 BI-LAMINATE (DRAPES) ×6 IMPLANT
DRAPE SURG IRRIG POUCH 19X23 (DRAPES) ×3 IMPLANT
DRAPE TABLE BACK 44X90 PK DISP (DRAPES) ×2 IMPLANT
DRAPE WARM FLUID 44X44 (DRAPE) ×1 IMPLANT
DRSG TEGADERM 6X8 (GAUZE/BANDAGES/DRESSINGS) ×4 IMPLANT
ELECT REM PT RETURN 9FT ADLT (ELECTROSURGICAL) ×3
ELECTRODE REM PT RTRN 9FT ADLT (ELECTROSURGICAL) ×2 IMPLANT
GLOVE BIO SURGEON STRL SZ 6 (GLOVE) ×9 IMPLANT
GLOVE BIO SURGEON STRL SZ 6.5 (GLOVE) ×8 IMPLANT
GOWN STRL REUS W/ TWL LRG LVL3 (GOWN DISPOSABLE) ×6 IMPLANT
GOWN STRL REUS W/TWL LRG LVL3 (GOWN DISPOSABLE) ×9
HOLDER FOLEY CATH W/STRAP (MISCELLANEOUS) ×3 IMPLANT
KIT ACCESSORY DA VINCI DISP (KITS) ×1
KIT ACCESSORY DVNC DISP (KITS) ×2 IMPLANT
KIT BASIN OR (CUSTOM PROCEDURE TRAY) ×3 IMPLANT
KIT PROCEDURE DA VINCI SI (MISCELLANEOUS) ×1
KIT PROCEDURE DVNC SI (MISCELLANEOUS) ×1 IMPLANT
LIQUID BAND (GAUZE/BANDAGES/DRESSINGS) ×3 IMPLANT
MANIPULATOR UTERINE 4.5 ZUMI (MISCELLANEOUS) ×3 IMPLANT
NDL SAFETY ECLIPSE 18X1.5 (NEEDLE) ×1 IMPLANT
NDL SPNL 18GX3.5 QUINCKE PK (NEEDLE) IMPLANT
NEEDLE HYPO 18GX1.5 SHARP (NEEDLE) ×3
NEEDLE SPNL 18GX3.5 QUINCKE PK (NEEDLE) ×3 IMPLANT
OCCLUDER COLPOPNEUMO (BALLOONS) ×3 IMPLANT
PEN SKIN MARKING BROAD (MISCELLANEOUS) ×3 IMPLANT
PORT ACCESS TROCAR AIRSEAL 12 (TROCAR) ×1 IMPLANT
PORT ACCESS TROCAR AIRSEAL 5M (TROCAR) ×1
POUCH SPECIMEN RETRIEVAL 10MM (ENDOMECHANICALS) IMPLANT
SEAL CANN UNIV 5-8 DVNC XI (MISCELLANEOUS) ×4 IMPLANT
SEAL XI 5MM-8MM UNIVERSAL (MISCELLANEOUS) ×4
SET TRI-LUMEN FLTR TB AIRSEAL (TUBING) ×3 IMPLANT
SET TUBE IRRIG SUCTION NO TIP (IRRIGATION / IRRIGATOR) ×3 IMPLANT
SHEET LAVH (DRAPES) ×3 IMPLANT
SOLUTION ELECTROLUBE (MISCELLANEOUS) ×3 IMPLANT
SUT VIC AB 0 CT1 27 (SUTURE) ×3
SUT VIC AB 0 CT1 27XBRD ANTBC (SUTURE) ×2 IMPLANT
SUT VIC AB 4-0 PS2 27 (SUTURE) ×6 IMPLANT
SYR 50ML LL SCALE MARK (SYRINGE) ×3 IMPLANT
SYRINGE 10CC LL (SYRINGE) ×2 IMPLANT
TOWEL OR 17X26 10 PK STRL BLUE (TOWEL DISPOSABLE) ×4 IMPLANT
TOWEL OR NON WOVEN STRL DISP B (DISPOSABLE) ×3 IMPLANT
TRAP SPECIMEN MUCOUS 40CC (MISCELLANEOUS) ×2 IMPLANT
TRAY FOLEY W/METER SILVER 14FR (SET/KITS/TRAYS/PACK) ×3 IMPLANT
TRAY FOLEY W/METER SILVER 16FR (SET/KITS/TRAYS/PACK) ×1 IMPLANT
TRAY LAPAROSCOPIC (CUSTOM PROCEDURE TRAY) ×3 IMPLANT
TROCAR 12M 150ML BLUNT (TROCAR) ×3 IMPLANT
TROCAR BLADELESS OPT 5 100 (ENDOMECHANICALS) ×3 IMPLANT
TROCAR DISP BLADELESS 8 DVNC (TROCAR) ×1 IMPLANT
TROCAR DISP BLADELESS 8MM (TROCAR) ×1
TROCAR PORT AIRSEAL 5X120 (TROCAR) IMPLANT
WATER STERILE IRR 1500ML POUR (IV SOLUTION) ×4 IMPLANT

## 2015-01-27 NOTE — Interval H&P Note (Signed)
History and Physical Interval Note:  01/27/2015 1:42 PM  Emily Thompson  has presented today for surgery, with the diagnosis of GRADE 1 ENDOMETRIAL CANCER  The various methods of treatment have been discussed with the patient and family. After consideration of risks, benefits and other options for treatment, the patient has consented to  Procedure(s): ROBOTIC ASSISTED TOTAL HYSTERECTOMY WITH BILATERAL SALPINGO OOPHORECTOMY AND SENTINAL NODE BIOPSY (Bilateral) POSSIBLE MINI LAPAROTOMY (N/A) as a surgical intervention .  The patient's history has been reviewed, patient examined, no change in status, stable for surgery.  I have reviewed the patient's chart and labs.  Questions were answered to the patient's satisfaction.     Donaciano Eva

## 2015-01-27 NOTE — H&P (View-Only) (Signed)
Consult Note: Gyn-Onc  Consult was requested by Dr. Toney Rakes for the evaluation of Emily Thompson 69 y.o. female  CC:  Chief Complaint  Patient presents with  . endometrial cancer    New consult    Assessment/Plan:  Ms. Emily Thompson  is a 69 y.o.  year old with grade 1 endometrial cancer (and a personal hx of melanoma and breast cancer).    A detailed discussion was held with the patient and her family with regard to to her endometrial cancer diagnosis. We discussed the standard management options for uterine cancer which includes surgery followed possibly by adjuvant therapy depending on the results of surgery. The options for surgical management include a hysterectomy and removal of the tubes and ovaries possibly with removal of pelvic and para-aortic lymph nodes. A minimally invasive approach including a robotic hysterectomy or laparoscopic hysterectomy have benefits including shorter hospital stay, recovery time and better wound healing. The alternative approach is an open hysterectomy. The patient has been counseled about these surgical options and the risks of surgery in general including infection, bleeding, damage to surrounding structures (including bowel, bladder, ureters, nerves or vessels), and the postoperative risks of PE/ DVT, and lymphedema. I extensively reviewed the additional risks of robotic hysterectomy including possible need for conversion to open laparotomy.  I discussed positioning during surgery of trendelenberg and risks of minor facial swelling and care we take in preoperative positioning.  After counseling and consideration of her options, she desires to proceed with robotic hysterectomy and BSO and sentinel lymph node biopsy.   She has a small cervix with an os that's difficult to access and specimen delivery may be challenging. She may need a minilaparotomy for specimen delivery.   She will be seen by anesthesia for preoperative clearance and discussion  of postoperative pain management.  She was given the opportunity to ask questions, which were answered to her satisfaction, and she is agreement with the above mentioned plan of care.   HPI: Emily Thompson is a very pleasant gravida 0 who is seen in consultation at the request of Dr. 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 The patient as a history of postmenopausal bleeding. She underwent sonohysterogram an ultrasound-guided endometrial biopsy. Dilation of the os was difficult and as was location of the cervical os.   Ultrasound revealed a 9 x 4.4 x 3 cm uterus with a 17 mm endometrial stripe. The uterus was retroverted. Ovaries appeared normal. 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  The patient has a personal history significant for history of breast cancer (stage I, treated with Dr. Marko Plume with lumpectomy, chemotherapy and radiation, tamoxifen 1 year, and Femara for additional time). This was diagnosed at age 75. She also has a history of a melanoma on her face. She denies other significant family history for malignancy with the exception of an aunt who had colon cancer at age 54.  She has a personal history of a cesarean section 1. She is otherwise healthy with history of palpitations and hypertension but only one prior to surgery (the cesarean section).  Current Meds:  Outpatient Encounter Prescriptions as of 01/14/2015  Medication Sig  . aspirin 81 MG tablet Take 81 mg by mouth daily.   . betamethasone dipropionate (DIPROLENE) 0.05 % cream Apply topically 2 (two) times daily. (Patient taking differently: Apply topically 2 (two) times daily as needed. )  . Calcium Citrate-Vitamin D (CITRACAL +  D PO) Take 1,200 mg by mouth daily.  Marland Kitchen diltiazem (CARDIZEM CD) 240 MG 24 hr capsule Take 1 capsule (240 mg total) by mouth daily.  Marland Kitchen ezetimibe (ZETIA) 10 MG tablet Take 1  tablet (10 mg total) by mouth daily.  . fluticasone (FLONASE) 50 MCG/ACT nasal spray Place 1 spray into both nostrils as needed for allergies or rhinitis.  . Multiple Vitamins-Minerals (PRESERVISION AREDS 2 PO) Take 1 capsule by mouth daily.  . ranitidine (ZANTAC) 75 MG tablet Take by mouth. 1 in the morning, 2 at night  . riboflavin (VITAMIN B-2) 100 MG TABS Take 200 mg by mouth 2 (two) times daily.   Marland Kitchen telmisartan (MICARDIS) 40 MG tablet Take 1 tablet (40 mg total) by mouth every morning.  . TURMERIC PO Take by mouth daily.  . megestrol (MEGACE) 40 MG tablet Take 1 tablet (40 mg total) by mouth 2 (two) times daily. (Patient not taking: Reported on 01/14/2015)  . [DISCONTINUED] amoxicillin-clavulanate (AUGMENTIN) 875-125 MG per tablet Take 1 tablet by mouth 2 (two) times daily.   No facility-administered encounter medications on file as of 01/14/2015.    Allergy:  Allergies  Allergen Reactions  . Dextromethorphan     Per pt, increases BP  . Statins     Headache, hot flashes  . Tape   . Morphine And Related     Social Hx:   Social History   Social History  . Marital Status: Widowed    Spouse Name: N/A  . Number of Children: 2  . Years of Education: BSN   Occupational History  . 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 Smoking status: Former Smoker -- 10 years    Types: Cigarettes    Quit date: 05/20/1973  . Smokeless tobacco: Never Used  . Alcohol Use: 2.4 oz/week    2 Glasses of wine, 2 Cans of beer per week     Comment: 4 A WEEK  . Drug Use: No  . Sexual Activity: Yes    Birth Control/ Protection: Post-menopausal   Other Topics Concern  . Not on file   Social History Narrative   Widow, 1 adopted and 1 natural child   Live by herself    Past Surgical Hx:  Past Surgical History  Procedure Laterality Date  . Breast lumpectomy Left 07/2001  . Breast reconstruction  2005  . Tubal ligation  1978  . Vulva /perineum biopsy       PAPILLOMA  . Cesarean section  1977  . Tonsillectomy and adenoidectomy  1952  . Nose surgery      basal cell carcinoma removed  . Basal cell carcinoma excision    . Skin cancer excision      melanoma  . Colonoscopy w/ polypectomy  2013    NEXT DUE IN 5 YRS  . Transthoracic echocardiogram  12/2005    EF 50-55%; mild MR; trace TR  . Nm myocar perf wall motion  01/2006    bruce myoview; no evidence of inducible ischemia, anterior wall thinning without evidence of ischemia; post-stress EF 88%; low risk scan     Past Medical Hx:  Past Medical History  Diagnosis Date  . GERD (gastroesophageal reflux disease)   . Osteopenia   . SVT (supraventricular tachycardia)     used to see Dr Rex Kras   . Dyslipidemia   . Hearing aid worn   . Hypertension   . Melanoma 2012  righ side face; basal cell (nose)   . BCC (basal cell carcinoma of skin) 2013, 2016  . Breast cancer 2003    chemo, radiation, lumpectomy, reconstructive surgery     Past Gynecological History:  C/S x 1  Patient's last menstrual period was 05/14/1997.  Family Hx:  Family History  Problem Relation Age of Onset  . Hypertension Mother   . Hypertension Father   . Stroke Father     hemorrhagic CVA at age 54  . Colon cancer Maternal Aunt     dx at age 15s  . Diabetes Brother   . Breast cancer Neg Hx   . Bipolar disorder Brother   . Thyroid disease Mother     M and B (?)    Review of Systems:  Constitutional  Feels well,    ENT Normal appearing ears and nares bilaterally Skin/Breast  No rash, sores, jaundice, itching, dryness Cardiovascular  No chest pain, shortness of breath, or edema  Pulmonary  No cough or wheeze.  Gastro Intestinal  No nausea, vomitting, or diarrhoea. No bright red blood per rectum, no abdominal pain, change in bowel movement, or constipation.  Genito Urinary  No frequency, urgency, dysuria, see HPI (postmenopausal bleeding) Musculo Skeletal  No myalgia, arthralgia, joint swelling or  pain  Neurologic  No weakness, numbness, change in gait,  Psychology  No depression, anxiety, insomnia.   Vitals:  Blood pressure 150/65, pulse 68, temperature 98.3 F (36.8 C), temperature source Oral, resp. rate 18, height 5' (1.524 m), weight 143 lb 9.6 oz (65.137 kg), last menstrual period 05/14/1997, SpO2 100 %.  Physical Exam: WD in NAD Neck  Supple NROM, without any enlargements.  Lymph Node Survey No cervical supraclavicular or inguinal adenopathy Cardiovascular  Pulse normal rate, regularity and rhythm. S1 and S2 normal.  Lungs  Clear to auscultation bilateraly, without wheezes/crackles/rhonchi. Good air movement.  Skin  No rash/lesions/breakdown  Psychiatry  Alert and oriented to person, place, and time  Abdomen  Normoactive bowel sounds, abdomen soft, non-tender and thin without evidence of hernia.  Back No CVA tenderness Genito Urinary  Vulva/vagina: Normal external female genitalia.  No lesions. No discharge or bleeding.  Bladder/urethra:  No lesions or masses, well supported bladder  Vagina: normal  Cervix: flush with vagina, small.  Uterus: Small, mobile, no parametrial involvement or nodularity.  Adnexa: no palpable masses. Rectal  Good tone, no masses no cul de sac nodularity.  Extremities  No bilateral cyanosis, clubbing or edema.   Donaciano Eva, MD  01/14/2015, 11:47 AM

## 2015-01-27 NOTE — Op Note (Addendum)
OPERATIVE NOTE 01/27/15  Surgeon: Donaciano Eva   Assistants: Dr Lahoma Crocker (an MD assistant was necessary for tissue manipulation, management of robotic instrumentation, retraction and positioning due to the complexity of the case and hospital policies).   Anesthesia: General endotracheal anesthesia  ASA Class: 3   Pre-operative Diagnosis: grade 1 endometrial cancer  Post-operative Diagnosis: same  Operation: Robotic-assisted laparoscopic hysterectomy with bilateral salpingoophorectomy, bilateral SLN biopsy  Surgeon: Donaciano Eva  Assistant Surgeon: Lahoma Crocker MD  Anesthesia: GET  Urine Output: 100  Operative Findings:  : 6 week size uterus, adherent to anterior abdominal wall, normal appearing tubes and ovaries, normal appearing lymph nodes.   Estimated Blood Loss:  Minimal      Total IV Fluids: 500 ml         Specimens: uterus, cervix, bilateral tubes and ovaries, washings, right presacral SLN, right common iliacl SLN, left external iliac SLN, left proximal external iliac SLN         Complications:  None; patient tolerated the procedure well.         Disposition: PACU - hemodynamically stable.  Procedure Details  The patient was seen in the Holding Room. The risks, benefits, complications, treatment options, and expected outcomes were discussed with the patient.  The patient concurred with the proposed plan, giving informed consent.  The site of surgery properly noted/marked. The patient was identified as Emily Thompson and the procedure verified as a Robotic-assisted hysterectomy with bilateral salpingo oophorectomy. A Time Out was held and the above information confirmed.  After induction of anesthesia, the patient was draped and prepped in the usual sterile manner. Pt was placed in supine position after anesthesia and draped and prepped in the usual sterile manner. The abdominal drape was placed after the CholoraPrep had been allowed to  dry for 3 minutes.  Her arms were tucked to her side with all appropriate precautions.  The shoulders were stabilized with padded allen blocks on the acromium processes. The patient was placed in the semi-lithotomy position in [place].  The perineum was prepped with Betadine.  Foley catheter was placed.  A sterile speculum was placed in the vagina.  The cervix was grasped with a single-tooth tenaculum. 1mg  total of ICG was injected into the cervical stroma at 2 and 9 o'clock at a 58mm depth (concentration 0..5mg /ml).  The cervix was dilated with Kennon Rounds dilators.  The ZUMI uterine manipulator with a medium colpotomizer ring was placed without difficulty.  A pneum occluder balloon was placed over the manipulator.  A second time-out was performed.  OG tube placement was confirmed and to suction.   The patient was then draped in the normal manner.  Next, a 5 mm skin incision was made 1 cm below the subcostal margin in the midclavicular line.  The 5 mm Optiview port and scope was used for direct entry.  Opening pressure was under 10 mm CO2.  The abdomen was insufflated and the findings were noted as above.   At this point and all points during the procedure, the patient's intra-abdominal pressure did not exceed 15 mmHg. Next, a 10 mm skin incision was made in the umbilicus and a right and left port was placed about 10 cm lateral to the robot port on the right and left side.  A fourth arm was placed in the left lower quadrant 2 cm above and superior and medial to the anterior superior iliac spine.  All ports were placed under direct visualization.  The patient  was placed in steep Trendelenburg.  Bowel was away into the upper abdomen.  The robot was docked in the normal manner.  The right and left peritoneum were opened parallel to the IP ligament to open the retroperitoneal spaces bilaterally. The SLN mapping was performed in bilateral pelvic basins. The para rectal and paravesical spaces were opened up.  Lymphatic channels were identified travelling to the following visualized sentinel lymph node's: right common iliac SLN, right presacral SLN, left external iliac SLN and left proximal external iliac SLN. These SLN's were separated from their surrounding lymphatic tissue, removed and sent for permanent pathology.  The hysterectomy was started after the round ligament on the right side was incised and the retroperitoneum was entered and the pararectal space was developed.  The ureter was noted to be on the medial leaf of the broad ligament.  The peritoneum above the ureter was incised and stretched and the infundibulopelvic ligament was skeletonized, cauterized and cut.  The posterior peritoneum was taken down to the level of the KOH ring.  The anterior peritoneum was also taken down.  The bladder flap was created to the level of the KOH ring.  The uterine artery on the right side was skeletonized, cauterized and cut in the normal manner.  A similar procedure was performed on the left.  The colpotomy was made and the uterus, cervix, bilateral ovaries and tubes were amputated and delivered through the vagina.  Pedicles were inspected and excellent hemostasis was achieved.    The colpotomy at the vaginal cuff was closed with Vicryl on a CT1 needle in a running manner.  Irrigation was used and excellent hemostasis was achieved.  At this point in the procedure was completed.  Robotic instruments were removed under direct visulaization.  The robot was undocked. The 10 mm ports were closed with Vicryl on a UR-5 needle and the fascia was closed with 0 Vicryl on a UR-5 needle.  The skin was closed with 4-0 Vicryl in a subcuticular manner.  Dermabond was applied.  Sponge, lap and needle counts correct x 2.  The patient was taken to the recovery room in stable condition.  The vagina was swabbed with  minimal bleeding noted.   All instrument and needle counts were correct x  3.   The patient was transferred to the  recovery room in a stable condition.   Donaciano Eva, MD

## 2015-01-27 NOTE — Anesthesia Preprocedure Evaluation (Addendum)
Anesthesia Evaluation  Patient identified by MRN, date of birth, ID band Patient awake    Reviewed: Allergy & Precautions, NPO status , Patient's Chart, lab work & pertinent test results  History of Anesthesia Complications (+) PONV and history of anesthetic complications  Airway Mallampati: II  TM Distance: >3 FB Neck ROM: Full    Dental no notable dental hx. (+) Dental Advisory Given   Pulmonary former smoker,    Pulmonary exam normal breath sounds clear to auscultation       Cardiovascular hypertension, Pt. on medications Normal cardiovascular exam Rhythm:Regular Rate:Normal     Neuro/Psych negative neurological ROS  negative psych ROS   GI/Hepatic Neg liver ROS, GERD  Medicated and Controlled,  Endo/Other  negative endocrine ROS  Renal/GU negative Renal ROS  negative genitourinary   Musculoskeletal  (+) Arthritis , Osteoarthritis,    Abdominal   Peds negative pediatric ROS (+)  Hematology negative hematology ROS (+)   Anesthesia Other Findings   Reproductive/Obstetrics negative OB ROS                           Anesthesia Physical Anesthesia Plan  ASA: II  Anesthesia Plan: General   Post-op Pain Management:    Induction: Intravenous  Airway Management Planned: Oral ETT  Additional Equipment:   Intra-op Plan:   Post-operative Plan: Extubation in OR  Informed Consent: I have reviewed the patients History and Physical, chart, labs and discussed the procedure including the risks, benefits and alternatives for the proposed anesthesia with the patient or authorized representative who has indicated his/her understanding and acceptance.   Dental advisory given  Plan Discussed with:   Anesthesia Plan Comments:        Anesthesia Quick Evaluation

## 2015-01-27 NOTE — Anesthesia Procedure Notes (Signed)
Procedure Name: Intubation Date/Time: 01/27/2015 2:26 PM Performed by: Montel Clock Pre-anesthesia Checklist: Patient identified, Emergency Drugs available, Suction available, Patient being monitored and Timeout performed Patient Re-evaluated:Patient Re-evaluated prior to inductionOxygen Delivery Method: Circle system utilized Preoxygenation: Pre-oxygenation with 100% oxygen Intubation Type: IV induction Ventilation: Mask ventilation without difficulty Laryngoscope Size: Mac and 4 Grade View: Grade II Tube type: Oral Tube size: 7.0 mm Number of attempts: 1 Airway Equipment and Method: Stylet Placement Confirmation: ETT inserted through vocal cords under direct vision,  positive ETCO2 and breath sounds checked- equal and bilateral Secured at: 21 cm Tube secured with: Tape Dental Injury: Teeth and Oropharynx as per pre-operative assessment

## 2015-01-27 NOTE — Transfer of Care (Signed)
Immediate Anesthesia Transfer of Care Note  Patient: Emily Thompson  Procedure(s) Performed: Procedure(s): ROBOTIC ASSISTED TOTAL HYSTERECTOMY WITH BILATERAL SALPINGO OOPHORECTOMY AND SENTINAL NODE BIOPSY (Bilateral)  Patient Location: PACU  Anesthesia Type:General  Level of Consciousness:  sedated, patient cooperative and responds to stimulation  Airway & Oxygen Therapy:Patient Spontanous Breathing and Patient connected to face mask oxgen  Post-op Assessment:  Report given to PACU RN and Post -op Vital signs reviewed and stable  Post vital signs:  Reviewed and stable  Last Vitals:  Filed Vitals:   01/27/15 1102  BP: 143/71  Pulse: 67  Temp: 36.6 C  Resp: 18    Complications: No apparent anesthesia complications

## 2015-01-28 ENCOUNTER — Encounter (HOSPITAL_COMMUNITY): Payer: Self-pay | Admitting: Gynecologic Oncology

## 2015-01-28 DIAGNOSIS — C541 Malignant neoplasm of endometrium: Secondary | ICD-10-CM | POA: Diagnosis not present

## 2015-01-28 LAB — CBC
HCT: 39.2 % (ref 36.0–46.0)
Hemoglobin: 13 g/dL (ref 12.0–15.0)
MCH: 30.2 pg (ref 26.0–34.0)
MCHC: 33.2 g/dL (ref 30.0–36.0)
MCV: 91 fL (ref 78.0–100.0)
Platelets: 249 10*3/uL (ref 150–400)
RBC: 4.31 MIL/uL (ref 3.87–5.11)
RDW: 12.9 % (ref 11.5–15.5)
WBC: 15.1 10*3/uL — ABNORMAL HIGH (ref 4.0–10.5)

## 2015-01-28 LAB — BASIC METABOLIC PANEL
Anion gap: 5 (ref 5–15)
BUN: 9 mg/dL (ref 6–20)
CO2: 26 mmol/L (ref 22–32)
Calcium: 9.2 mg/dL (ref 8.9–10.3)
Chloride: 108 mmol/L (ref 101–111)
Creatinine, Ser: 0.74 mg/dL (ref 0.44–1.00)
GFR calc Af Amer: >60 mL/min (ref >60)
GFR calc non Af Amer: >60 mL/min (ref >60)
Glucose, Bld: 160 mg/dL — ABNORMAL HIGH (ref 65–99)
Potassium: 4.6 mmol/L (ref 3.5–5.1)
Sodium: 139 mmol/L (ref 135–145)

## 2015-01-28 MED ORDER — OXYCODONE-ACETAMINOPHEN 5-325 MG PO TABS: 1.0000 | ORAL_TABLET | ORAL | Status: DC | PRN

## 2015-01-28 NOTE — Progress Notes (Signed)
Discharge instructions along with prescriptions given to patient at discharge.  Questions answered

## 2015-01-28 NOTE — Anesthesia Postprocedure Evaluation (Addendum)
  Anesthesia Post-op Note  Patient: Emily Thompson  Procedure(s) Performed: Procedure(s) (LRB): ROBOTIC ASSISTED TOTAL HYSTERECTOMY WITH BILATERAL SALPINGO OOPHORECTOMY AND SENTINAL NODE BIOPSY (Bilateral)  Patient Location: PACU  Anesthesia Type: General  Level of Consciousness: awake and alert   Airway and Oxygen Therapy: Patient Spontanous Breathing  Post-op Pain: mild  Post-op Assessment: Post-op Vital signs reviewed, Patient's Cardiovascular Status Stable, Respiratory Function Stable, Patent Airway and No signs of Nausea or vomiting  Last Vitals:  Filed Vitals:   01/28/15 0632  BP: 129/53  Pulse: 71  Temp: 36.6 C  Resp: 18    Post-op Vital Signs: stable   Complications: No apparent anesthesia complications

## 2015-01-28 NOTE — Discharge Summary (Signed)
Physician Discharge Summary  Patient ID: Emily Thompson MRN: [id number] DOB/AGE: 1946-04-28 69 y.o.  Admit date: 01/27/2015 Discharge date: 01/28/2015  Admission Diagnoses: Endometrial cancer  Discharge Diagnoses:  Principal Problem:   Endometrial cancer Active Problems:   Uterine cancer   Discharged Condition:  The patient is in good condition and stable for discharge.   Hospital Course: On 01/27/2015, the patient underwent the following: Procedure(s): ROBOTIC ASSISTED TOTAL HYSTERECTOMY WITH BILATERAL SALPINGO OOPHORECTOMY AND SENTINAL NODE BIOPSY.  The postoperative course was uneventful.  She was discharged to home on postoperative day 1 tolerating a regular diet, voiding without difficulty, ambulating, minimal pain.  Pre-op chest xray results discussed and CT of the chest will be scheduled before appt with Dr. Denman Thompson for follow up.  Consults: None  Significant Diagnostic Studies: None  Treatments: surgery: see above  Discharge Exam: Blood pressure 137/63, pulse 91, temperature 98 F (36.7 C), temperature source Oral, resp. rate 18, height 5' (1.524 m), weight 142 lb (64.411 kg), last menstrual period 05/14/1997, SpO2 99 %. General appearance: alert, cooperative and no distress Resp: clear to auscultation bilaterally Cardio: regular rate and rhythm, S1, S2 normal, no murmur, click, rub or gallop GI: soft, non-tender; bowel sounds normal; no masses,  no organomegaly Extremities: extremities normal, atraumatic, no cyanosis or edema Incision/Wound: Lap sites with dermabond without erythema or drainage  Disposition: 01-Home or Self CareHome      Discharge Instructions    Call MD for:  difficulty breathing, headache or visual disturbances    Complete by:  As directed      Call MD for:  extreme fatigue    Complete by:  As directed      Call MD for:  hives    Complete by:  As directed      Call MD for:  persistant dizziness or light-headedness    Complete by:  As  directed      Call MD for:  persistant nausea and vomiting    Complete by:  As directed      Call MD for:  redness, tenderness, or signs of infection (pain, swelling, redness, odor or green/yellow discharge around incision site)    Complete by:  As directed      Call MD for:  severe uncontrolled pain    Complete by:  As directed      Call MD for:  temperature >100.4    Complete by:  As directed      Diet - low sodium heart healthy    Complete by:  As directed      Driving Restrictions    Complete by:  As directed   No driving for 1 week.  Do not take narcotics and drive.     Increase activity slowly    Complete by:  As directed      Lifting restrictions    Complete by:  As directed   No lifting greater than 10 lbs.     Sexual Activity Restrictions    Complete by:  As directed   No sexual activity, nothing in the vagina, for 8 weeks.            Medication List    STOP taking these medications        megestrol 40 MG tablet  Commonly known as:  MEGACE      TAKE these medications        aspirin 81 MG tablet  Take 81 mg by mouth daily.     betamethasone dipropionate  0.05 % cream  Commonly known as:  DIPROLENE  Apply topically 2 (two) times daily.     CITRACAL + D PO  Take 1,200 mg by mouth daily.     diltiazem 240 MG 24 hr capsule  Commonly known as:  CARDIZEM CD  Take 1 capsule (240 mg total) by mouth daily.     ezetimibe 10 MG tablet  Commonly known as:  ZETIA  Take 1 tablet (10 mg total) by mouth daily.     oxyCODONE-acetaminophen 5-325 MG per tablet  Commonly known as:  PERCOCET/ROXICET  Take 1-2 tablets by mouth every 4 (four) hours as needed (moderate to severe pain).     PRESERVISION AREDS 2 PO  Take 1 capsule by mouth daily.     ranitidine 75 MG tablet  Commonly known as:  ZANTAC  Take 75 mg by mouth 3 (three) times daily.     riboflavin 100 MG Tabs tablet  Commonly known as:  VITAMIN B-2  Take 200 mg by mouth 2 (two) times daily.      telmisartan 40 MG tablet  Commonly known as:  MICARDIS  Take 1 tablet (40 mg total) by mouth every morning.     TURMERIC PO  Take 1 tablet by mouth daily.       Follow-up Information    Follow up with Donaciano Eva, MD On 02/18/2015.   Specialty:  Obstetrics and Gynecology   Why:  at the [facility] at 10:45am.   Contact information:   [address] [place] [address] [phone]       Greater than thirty minutes were spend for face to face discharge instructions and discharge orders/summary in EPIC.   Signed: CROSS, MELISSA DEAL 01/28/2015, 3:28 PM

## 2015-01-28 NOTE — Discharge Instructions (Addendum)
01/28/2015  Return to work: 4-6 weeks if applicable  Activity: 1. Be up and out of the bed during the day.  Take a nap if needed.  You may walk up steps but be careful and use the hand rail.  Stair climbing will tire you more than you think, you may need to stop part way and rest.   2. No lifting or straining for 6 weeks.  3. No driving for 1 week(s).  Do not drive if you are taking narcotic pain medicine.  4. Shower daily.  Use soap and water on your incision and pat dry; don't rub.  No tub baths until cleared by your surgeon.   5. No sexual activity and nothing in the vagina for 8 weeks.  6. You may experience a small amount of clear drainage from your incisions, which is normal.  If the drainage persists or increases, please call the office.  7.  We will arrange for a CT scan of the chest in the next several weeks to follow up on chest xray results.  8. You may also take ibuprofen as needed.    Diet: 1. Low sodium Heart Healthy Diet is recommended.  2. It is safe to use a laxative, such as Miralax or Colace, if you have difficulty moving your bowels.   Wound Care: 1. Keep clean and dry.  Shower daily.  Reasons to call the Doctor:  Fever - Oral temperature greater than 100.4 degrees Fahrenheit  Foul-smelling vaginal discharge  Difficulty urinating  Nausea and vomiting  Increased pain at the site of the incision that is unrelieved with pain medicine.  Difficulty breathing with or without chest pain  New calf pain especially if only on one side  Sudden, continuing increased vaginal bleeding with or without clots.   Contacts: For questions or concerns you should contact:  Dr. Everitt Amber at [phone]  Joylene John, NP at [phone]  After Hours: call [phone] and ask for the GYN Oncologist on call  Acetaminophen; Oxycodone tablets What is this medicine? ACETAMINOPHEN; OXYCODONE (a set a MEE noe fen; ox i KOE done) is a pain reliever. It is used to  treat mild to moderate pain. This medicine may be used for other purposes; ask your health care provider or pharmacist if you have questions. COMMON BRAND NAME(S): Endocet, Magnacet, Narvox, Percocet, Perloxx, Primalev, Primlev, Roxicet, Xolox What should I tell my health care provider before I take this medicine? They need to know if you have any of these conditions: -brain tumor -Crohn's disease, inflammatory bowel disease, or ulcerative colitis -drug abuse or addiction -head injury -heart or circulation problems -if you often drink alcohol -kidney disease or problems going to the bathroom -liver disease -lung disease, asthma, or breathing problems -an unusual or allergic reaction to acetaminophen, oxycodone, other opioid analgesics, other medicines, foods, dyes, or preservatives -pregnant or trying to get pregnant -breast-feeding How should I use this medicine? Take this medicine by mouth with a full glass of water. Follow the directions on the prescription label. Take your medicine at regular intervals. Do not take your medicine more often than directed. Talk to your pediatrician regarding the use of this medicine in children. Special care may be needed. Patients over 34 years old may have a stronger reaction and need a smaller dose. Overdosage: If you think you have taken too much of this medicine contact a poison control center or emergency room at once. NOTE: This medicine is only for you. Do not share this medicine  with others. What if I miss a dose? If you miss a dose, take it as soon as you can. If it is almost time for your next dose, take only that dose. Do not take double or extra doses. What may interact with this medicine? -alcohol -antihistamines -barbiturates like amobarbital, butalbital, butabarbital, methohexital, pentobarbital, phenobarbital, thiopental, and secobarbital -benztropine -drugs for bladder problems like solifenacin, trospium, oxybutynin, tolterodine,  hyoscyamine, and methscopolamine -drugs for breathing problems like ipratropium and tiotropium -drugs for certain stomach or intestine problems like propantheline, homatropine methylbromide, glycopyrrolate, atropine, belladonna, and dicyclomine -general anesthetics like etomidate, ketamine, nitrous oxide, propofol, desflurane, enflurane, halothane, isoflurane, and sevoflurane -medicines for depression, anxiety, or psychotic disturbances -medicines for sleep -muscle relaxants -naltrexone -narcotic medicines (opiates) for pain -phenothiazines like perphenazine, thioridazine, chlorpromazine, mesoridazine, fluphenazine, prochlorperazine, promazine, and trifluoperazine -scopolamine -tramadol -trihexyphenidyl This list may not describe all possible interactions. Give your health care provider a list of all the medicines, herbs, non-prescription drugs, or dietary supplements you use. Also tell them if you smoke, drink alcohol, or use illegal drugs. Some items may interact with your medicine. What should I watch for while using this medicine? Tell your doctor or health care professional if your pain does not go away, if it gets worse, or if you have new or a different type of pain. You may develop tolerance to the medicine. Tolerance means that you will need a higher dose of the medication for pain relief. Tolerance is normal and is expected if you take this medicine for a long time. Do not suddenly stop taking your medicine because you may develop a severe reaction. Your body becomes used to the medicine. This does NOT mean you are addicted. Addiction is a behavior related to getting and using a drug for a non-medical reason. If you have pain, you have a medical reason to take pain medicine. Your doctor will tell you how much medicine to take. If your doctor wants you to stop the medicine, the dose will be slowly lowered over time to avoid any side effects. You may get drowsy or dizzy. Do not drive, use  machinery, or do anything that needs mental alertness until you know how this medicine affects you. Do not stand or sit up quickly, especially if you are an older patient. This reduces the risk of dizzy or fainting spells. Alcohol may interfere with the effect of this medicine. Avoid alcoholic drinks. There are different types of narcotic medicines (opiates) for pain. If you take more than one type at the same time, you may have more side effects. Give your health care provider a list of all medicines you use. Your doctor will tell you how much medicine to take. Do not take more medicine than directed. Call emergency for help if you have problems breathing. The medicine will cause constipation. Try to have a bowel movement at least every 2 to 3 days. If you do not have a bowel movement for 3 days, call your doctor or health care professional. Do not take Tylenol (acetaminophen) or medicines that have acetaminophen with this medicine. Too much acetaminophen can be very dangerous. Many nonprescription medicines contain acetaminophen. Always read the labels carefully to avoid taking more acetaminophen. What side effects may I notice from receiving this medicine? Side effects that you should report to your doctor or health care professional as soon as possible: -allergic reactions like skin rash, itching or hives, swelling of the face, lips, or tongue -breathing difficulties, wheezing -confusion -light headedness or fainting  spells -severe stomach pain -unusually weak or tired -yellowing of the skin or the whites of the eyes Side effects that usually do not require medical attention (report to your doctor or health care professional if they continue or are bothersome): -dizziness -drowsiness -nausea -vomiting This list may not describe all possible side effects. Call your doctor for medical advice about side effects. You may report side effects to FDA at 1-800-FDA-1088. Where should I keep my  medicine? Keep out of the reach of children. This medicine can be abused. Keep your medicine in a safe place to protect it from theft. Do not share this medicine with anyone. Selling or giving away this medicine is dangerous and against the law. Store at room temperature between 20 and 25 degrees C (68 and 77 degrees F). Keep container tightly closed. Protect from light. This medicine may cause accidental overdose and death if it is taken by other adults, children, or pets. Flush any unused medicine down the toilet to reduce the chance of harm. Do not use the medicine after the expiration date. NOTE: This sheet is a summary. It may not cover all possible information. If you have questions about this medicine, talk to your doctor, pharmacist, or health care provider.  2015, Elsevier/Gold Standard. (2012-12-22 13:17:35)  Abdominal Hysterectomy, Care After These instructions give you information on caring for yourself after your procedure. Your doctor may also give you more specific instructions. Call your doctor if you have any problems or questions after your procedure.  HOME CARE It takes 4-6 weeks to recover from this surgery. Follow all of your doctor's instructions.   Only take medicines as told by your doctor.  Change your bandage as told by your doctor.  Return to your doctor to have your stitches taken out.  Take showers for 2-3 weeks. Ask your doctor when it is okay to shower.  Do not douche, use tampons, or have sex (intercourse) for at least 6 weeks or as told.  Follow your doctor's advice about exercise, lifting objects, driving, and general activities.  Get plenty of rest and sleep.  Do not lift anything heavier than a gallon of milk (about 10 pounds [4.5 kilograms]) for the first month after surgery.  Get back to your normal diet as told by your doctor.  Do not drink alcohol until your doctor says it is okay.  Take a medicine to help you poop (laxative) as told by your  doctor.  Eating foods high in fiber may help you poop. Eat a lot of raw fruits and vegetables, whole grains, and beans.  Drink enough fluids to keep your pee (urine) clear or pale yellow.  Have someone help you at home for 1-2 weeks after your surgery.  Keep follow-up doctor visits as told. GET HELP IF:  You have chills or fever.  You have puffiness, redness, or pain in area of the cut (incision).  You have yellowish-white fluid (pus) coming from the cut.  You have a bad smell coming from the cut or bandage.  Your cut pulls apart.  You feel dizzy or light-headed.  You have pain or bleeding when you pee.  You keep having watery poop (diarrhea).  You keep feeling sick to your stomach (nauseous) or keep throwing up (vomiting).  You have fluid (discharge) coming from your vagina.  You have a rash.  You have a reaction to your medicine.  You need stronger pain medicine. GET HELP RIGHT AWAY IF:   You have a fever and  your symptoms suddenly get worse.  You have bad belly (abdominal) pain.  You have chest pain.  You are short of breath.  You pass out (faint).  You have pain, puffiness, or redness of your leg.  You bleed a lot from your vagina and notice clumps of tissue (clots). MAKE SURE YOU:   Understand these instructions.  Will watch your condition.  Will get help right away if you are not doing well or get worse. Document Released: 02/07/2008 Document Revised: 05/05/2013 Document Reviewed: 02/20/2013 [facility] Patient Information 2015 [place], [place]. This information is not intended to replace advice given to you by your health care provider. Make sure you discuss any questions you have with your health care provider.

## 2015-01-31 ENCOUNTER — Institutional Professional Consult (permissible substitution): Payer: Medicare HMO | Admitting: Gynecology

## 2015-01-31 ENCOUNTER — Telehealth: Payer: Self-pay | Admitting: Gynecologic Oncology

## 2015-01-31 ENCOUNTER — Other Ambulatory Visit: Payer: Self-pay | Admitting: Gynecologic Oncology

## 2015-01-31 DIAGNOSIS — C55 Malignant neoplasm of uterus, part unspecified: Secondary | ICD-10-CM

## 2015-01-31 DIAGNOSIS — R918 Other nonspecific abnormal finding of lung field: Secondary | ICD-10-CM

## 2015-01-31 NOTE — Telephone Encounter (Signed)
Called to check on patient's post-operative status.  Pt stating she is doing well.  Informed of CT scan of the chest prior to appt with Dr. Denman George on Oct 7 to follow up on the opacity seen on pre-op chest xray.  Advised to be NPO 4 hours before.  Verbalizing understanding.  Advised to call for any questions or concerns.

## 2015-02-02 ENCOUNTER — Encounter: Payer: Medicare HMO | Admitting: Women's Health

## 2015-02-08 ENCOUNTER — Ambulatory Visit: Admit: 2015-02-08 | Payer: Self-pay | Admitting: Gynecology

## 2015-02-08 SURGERY — DILATATION & CURETTAGE/HYSTEROSCOPY WITH MYOSURE
Anesthesia: General

## 2015-02-18 ENCOUNTER — Ambulatory Visit (HOSPITAL_COMMUNITY)
Admission: RE | Admit: 2015-02-18 | Discharge: 2015-02-18 | Disposition: A | Discharge status: Home / Self Care | Payer: Medicare HMO | Source: Ambulatory Visit | Attending: Gynecologic Oncology | Admitting: Gynecologic Oncology

## 2015-02-18 ENCOUNTER — Encounter (HOSPITAL_COMMUNITY): Payer: Self-pay

## 2015-02-18 ENCOUNTER — Ambulatory Visit (HOSPITAL_BASED_OUTPATIENT_CLINIC_OR_DEPARTMENT_OTHER): Payer: Medicare HMO | Admitting: Gynecologic Oncology

## 2015-02-18 ENCOUNTER — Encounter: Payer: Self-pay | Admitting: Gynecologic Oncology

## 2015-02-18 VITALS — BP 149/56 | HR 65 | Temp 97.6°F | Resp 18 | Ht 60.0 in | Wt 143.0 lb

## 2015-02-18 DIAGNOSIS — R918 Other nonspecific abnormal finding of lung field: Secondary | ICD-10-CM | POA: Insufficient documentation

## 2015-02-18 DIAGNOSIS — C55 Malignant neoplasm of uterus, part unspecified: Secondary | ICD-10-CM | POA: Insufficient documentation

## 2015-02-18 DIAGNOSIS — C541 Malignant neoplasm of endometrium: Secondary | ICD-10-CM | POA: Diagnosis not present

## 2015-02-18 MED ORDER — IOHEXOL 300 MG/ML  SOLN
75.0000 mL | Freq: Once | INTRAMUSCULAR | Status: AC | PRN
  Administered 2015-02-18: 75 mL via INTRAVENOUS

## 2015-02-18 NOTE — Progress Notes (Signed)
POSTOPERATIVE FOLLOWUP Assessment:    69 y.o. year old with Stage IA Grade 1 endometrioid endometrial cancer.   S/p robotic hysterectomy, BSO, sentinel lymph node biopsy on 01/27/15. no LVSI, 40% myometrial invasion, negative pelvic washings and negative lymph nodes.   Plan: 1) Pathology reports reviewed today 2) Treatment counseling - Very low risk (<5%) for recurrence given age, grade, depth of myometrial invasion and LVSI status. Multidisciplinary tumor board recommendation is for routine surveillance with frequent pelvic exams and visits with annual pap smear.  We will start with visits every 6 months x 5 years and will alternate these visits with her primary gynecologic provider.  Discussed signs and symptoms of recurrence including vaginal bleeding or discharge, leg pain or swelling and changes in bowel or bladder habits. She was given the opportunity to ask questions, which were answered to her satisfaction, and she is agreement with the above mentioned plan of care.  3)  Return to clinic in 6 months  HPI:  Emily Thompson is a 69 y.o. year old [id number] initially seen in consultation on 01/14/15 referred by Dr Toney Rakes for grade 1 endometrial cancer.  She then underwent a robotic hysterectomy, BSO and sentinel lymph node biopsy on 8/0/32 without complications.  Her postoperative course was uncomplicated.  Her final pathologic diagnosis is a Stage IA Grade 1 endometrioid endometrial cancer with 40% lymphovascular space invasion, 6/15 mm (40%) of myometrial invasion and negative lymph nodes.  She is seen today for a postoperative check and to discuss her pathology results and treatment plan.  Since discharge from the hospital, she is feeling well.  She has improving appetite, normal bowel and bladder function, and pain controlled with minimal PO medication. She has no other complaints today.    Review of systems: Constitutional:  She has no weight gain or weight loss. She has no fever or  chills. Eyes: No blurred vision Ears, Nose, Mouth, Throat: No dizziness, headaches or changes in hearing. No mouth sores. Cardiovascular: No chest pain, palpitations or edema. Respiratory:  No shortness of breath, wheezing or cough Gastrointestinal: She has normal bowel movements without diarrhea or constipation. She denies any nausea or vomiting. She denies blood in her stool or heart burn. Genitourinary:  She denies pelvic pain, pelvic pressure or changes in her urinary function. She has no hematuria, dysuria, or incontinence. She has no irregular vaginal bleeding or vaginal discharge Musculoskeletal: Denies muscle weakness or joint pains.  Skin:  She has no skin changes, rashes or itching Neurological:  Denies dizziness or headaches. No neuropathy, no numbness or tingling. Psychiatric:  She denies depression or anxiety. Hematologic/Lymphatic:   No easy bruising or bleeding   Physical Exam: Blood pressure 149/56, pulse 65, temperature 97.6 F (36.4 C), temperature source Oral, resp. rate 18, height 5' (1.524 m), weight 143 lb (64.864 kg), last menstrual period 05/14/1997, SpO2 99 %. General: Well dressed, well nourished in no apparent distress.   HEENT:  Normocephalic and atraumatic, no lesions.  Extraocular muscles intact. Sclerae anicteric. Pupils equal, round, reactive. No mouth sores or ulcers. Thyroid is normal size, not nodular, midline. Skin:  No lesions or rashes. Abdomen:  Soft, nontender, nondistended.  No palpable masses.  No hepatosplenomegaly.  No ascites. Normal bowel sounds.  No hernias.  Incisions are well healed Genitourinary: Normal EGBUS  Vaginal cuff intact.  No bleeding or discharge.  No cul de sac fullness. Extremities: No cyanosis, clubbing or edema.  No calf tenderness or erythema. No palpable cords. Psychiatric: Mood and affect  are appropriate. Neurological: Awake, alert and oriented x 3. Sensation is intact, no neuropathy.  Musculoskeletal: No pain, normal  strength and range of motion.  15 minutes of direct face to face counseling regarding cancer staging, prognosis and followup were spent.  Donaciano Eva, MD

## 2015-02-18 NOTE — Patient Instructions (Addendum)
Follow up in six months (April) with Dr. Denman George and Elon Alas, NP in one year.  Please call for any questions or concerns.

## 2015-02-21 ENCOUNTER — Ambulatory Visit (INDEPENDENT_AMBULATORY_CARE_PROVIDER_SITE_OTHER): Payer: Medicare HMO

## 2015-02-21 DIAGNOSIS — Z23 Encounter for immunization: Secondary | ICD-10-CM | POA: Diagnosis not present

## 2015-02-23 ENCOUNTER — Encounter: Payer: Self-pay | Admitting: Internal Medicine

## 2015-02-23 ENCOUNTER — Telehealth: Payer: Self-pay

## 2015-02-23 NOTE — Telephone Encounter (Signed)
Telephone call states is doing well,  back to the gym exercising increasing gradually. Will come to the office in the spring for a breast exam had a recent mammogram.

## 2015-02-23 NOTE — Telephone Encounter (Signed)
Patient said she was scheduled to see you for CE right before she found out she had uterine cancer. She had to cancel that appointment. Meantime she had hysterectomy.  She said she will alternate seeing you and Dr. Denman George (onc) every 6 months.  She had her mammo 11/2014 and had been due to see you in Sept.  Her question is. Since her mammo was normal and she had the surgery and "had everything removed" the only thing she said she missed out on was the breast exam. She questioned could that just wait until next year or did you need to see for that?

## 2015-02-25 ENCOUNTER — Other Ambulatory Visit: Payer: Self-pay

## 2015-02-25 DIAGNOSIS — N289 Disorder of kidney and ureter, unspecified: Secondary | ICD-10-CM

## 2015-02-28 ENCOUNTER — Ambulatory Visit (HOSPITAL_COMMUNITY)
Admission: RE | Admit: 2015-02-28 | Discharge: 2015-02-28 | Disposition: A | Discharge status: Home / Self Care | Payer: Medicare HMO | Source: Ambulatory Visit | Attending: Internal Medicine | Admitting: Internal Medicine

## 2015-02-28 DIAGNOSIS — N281 Cyst of kidney, acquired: Secondary | ICD-10-CM | POA: Diagnosis not present

## 2015-02-28 DIAGNOSIS — N289 Disorder of kidney and ureter, unspecified: Secondary | ICD-10-CM

## 2015-03-17 ENCOUNTER — Encounter: Payer: Self-pay | Admitting: Women's Health

## 2015-05-30 ENCOUNTER — Encounter: Payer: Self-pay | Admitting: Internal Medicine

## 2015-05-30 ENCOUNTER — Other Ambulatory Visit: Payer: Self-pay | Admitting: Internal Medicine

## 2015-05-30 MED ORDER — MELOXICAM 7.5 MG PO TABS: 7.5000 mg | ORAL_TABLET | Freq: Every day | ORAL | Status: DC | PRN

## 2015-06-16 ENCOUNTER — Telehealth: Payer: Self-pay | Admitting: *Deleted

## 2015-06-16 NOTE — Telephone Encounter (Signed)
Unable to reach patient at time of pre-visit call. Left message for patient to return call when available.  

## 2015-06-17 ENCOUNTER — Ambulatory Visit (INDEPENDENT_AMBULATORY_CARE_PROVIDER_SITE_OTHER): Payer: PPO | Admitting: Internal Medicine

## 2015-06-17 ENCOUNTER — Encounter: Payer: Self-pay | Admitting: Internal Medicine

## 2015-06-17 VITALS — BP 118/64 | HR 79 | Temp 97.7°F | Ht 60.0 in | Wt 143.4 lb

## 2015-06-17 DIAGNOSIS — Z0000 Encounter for general adult medical examination without abnormal findings: Secondary | ICD-10-CM

## 2015-06-17 LAB — BASIC METABOLIC PANEL
BUN: 17 mg/dL (ref 6–23)
CO2: 29 mEq/L (ref 19–32)
Calcium: 10 mg/dL (ref 8.4–10.5)
Chloride: 103 mEq/L (ref 96–112)
Creatinine, Ser: 0.88 mg/dL (ref 0.40–1.20)
GFR: 67.22 mL/min (ref >60.00)
Glucose, Bld: 92 mg/dL (ref 70–99)
Potassium: 4.1 mEq/L (ref 3.5–5.1)
Sodium: 141 mEq/L (ref 135–145)

## 2015-06-17 LAB — LIPID PANEL
Cholesterol: 218 mg/dL — ABNORMAL HIGH (ref 0–200)
HDL: 79.9 mg/dL (ref >39.00)
LDL Cholesterol: 124 mg/dL — ABNORMAL HIGH (ref 0–99)
NonHDL: 138.06
Total CHOL/HDL Ratio: 3
Triglycerides: 68 mg/dL (ref 0.0–149.0)
VLDL: 13.6 mg/dL (ref 0.0–40.0)

## 2015-06-17 MED ORDER — HYDROCODONE-HOMATROPINE 5-1.5 MG/5ML PO SYRP: 5.0000 mL | ORAL_SOLUTION | Freq: Three times a day (TID) | ORAL | Status: DC | PRN

## 2015-06-17 NOTE — Assessment & Plan Note (Addendum)
Td ~2014; Pneumonia shot -- at age 70; prevnar-- 2016; had a Flu shot  Shingles shot-- completed   CCS:    Dr Collene Mares , 2nd cscope  09-2011 , next 5 years MMG 11-2014 Female care per gyn Doing well  w/ diet-exercise

## 2015-06-17 NOTE — Progress Notes (Signed)
Pre visit review using our clinic review tool, if applicable. No additional management support is needed unless otherwise documented below in the visit note. 

## 2015-06-17 NOTE — Patient Instructions (Signed)
GO TO THE LAB : Get the blood work    GO TO THE FRONT DESK    Schedule a complete physical exam to be done in 1 year Please be fasting     Check the  blood pressure 2 or 3 times a month   Be sure your blood pressure is between 110/65 and  145/85. If it is consistently higher or lower, let me know    Please consider visit these websites for more information:  www.begintheconversation.org  theconversationproject.org    Fall Prevention and Home Safety Falls cause injuries and can affect all age groups. It is possible to use preventive measures to significantly decrease the likelihood of falls. There are many simple measures which can make your home safer and prevent falls. OUTDOORS  Repair cracks and edges of walkways and driveways.  Remove high doorway thresholds.  Trim shrubbery on the main path into your home.  Have good outside lighting.  Clear walkways of tools, rocks, debris, and clutter.  Check that handrails are not broken and are securely fastened. Both sides of steps should have handrails.  Have leaves, snow, and ice cleared regularly.  Use sand or salt on walkways during winter months.  In the garage, clean up grease or oil spills. BATHROOM  Install night lights.  Install grab bars by the toilet and in the tub and shower.  Use non-skid mats or decals in the tub or shower.  Place a plastic non-slip stool in the shower to sit on, if needed.  Keep floors dry and clean up all water on the floor immediately.  Remove soap buildup in the tub or shower on a regular basis.  Secure bath mats with non-slip, double-sided rug tape.  Remove throw rugs and tripping hazards from the floors. BEDROOMS  Install night lights.  Make sure a bedside light is easy to reach.  Do not use oversized bedding.  Keep a telephone by your bedside.  Have a firm chair with side arms to use for getting dressed.  Remove throw rugs and tripping hazards from the  floor. KITCHEN  Keep handles on pots and pans turned toward the center of the stove. Use back burners when possible.  Clean up spills quickly and allow time for drying.  Avoid walking on wet floors.  Avoid hot utensils and knives.  Position shelves so they are not too high or low.  Place commonly used objects within easy reach.  If necessary, use a sturdy step stool with a grab bar when reaching.  Keep electrical cables out of the way.  Do not use floor polish or wax that makes floors slippery. If you must use wax, use non-skid floor wax.  Remove throw rugs and tripping hazards from the floor. STAIRWAYS  Never leave objects on stairs.  Place handrails on both sides of stairways and use them. Fix any loose handrails. Make sure handrails on both sides of the stairways are as long as the stairs.  Check carpeting to make sure it is firmly attached along stairs. Make repairs to worn or loose carpet promptly.  Avoid placing throw rugs at the top or bottom of stairways, or properly secure the rug with carpet tape to prevent slippage. Get rid of throw rugs, if possible.  Have an electrician put in a light switch at the top and bottom of the stairs. OTHER FALL PREVENTION TIPS  Wear low-heel or rubber-soled shoes that are supportive and fit well. Wear closed toe shoes.  When using a stepladder,  make sure it is fully opened and both spreaders are firmly locked. Do not climb a closed stepladder.  Add color or contrast paint or tape to grab bars and handrails in your home. Place contrasting color strips on first and last steps.  Learn and use mobility aids as needed. Install an electrical emergency response system.  Turn on lights to avoid dark areas. Replace light bulbs that burn out immediately. Get light switches that glow.  Arrange furniture to create clear pathways. Keep furniture in the same place.  Firmly attach carpet with non-skid or double-sided tape.  Eliminate uneven  floor surfaces.  Select a carpet pattern that does not visually hide the edge of steps.  Be aware of all pets. OTHER HOME SAFETY TIPS  Set the water temperature for 120 F (48.8 C).  Keep emergency numbers on or near the telephone.  Keep smoke detectors on every level of the home and near sleeping areas. Document Released: 04/20/2002 Document Revised: 10/30/2011 Document Reviewed: 07/20/2011 [facility] Patient Information 2015 [place], [place]. This information is not intended to replace advice given to you by your health care provider. Make sure you discuss any questions you have with your health care provider.   Preventive Care for Adults Ages 48 and over  Blood pressure check.** / Every 1 to 2 years.  Lipid and cholesterol check.**/ Every 5 years beginning at age 87.  Lung cancer screening. / Every year if you are aged 40-80 years and have a 30-pack-year history of smoking and currently smoke or have quit within the past 15 years. Yearly screening is stopped once you have quit smoking for at least 15 years or develop a health problem that would prevent you from having lung cancer treatment.  Fecal occult blood test (FOBT) of stool. / Every year beginning at age 57 and continuing until age 58. You may not have to do this test if you get a colonoscopy every 10 years.  Flexible sigmoidoscopy** or colonoscopy.** / Every 5 years for a flexible sigmoidoscopy or every 10 years for a colonoscopy beginning at age 65 and continuing until age 34.  Hepatitis C blood test.** / For all people born from [birth year] through 1967 and any individual with known risks for hepatitis C.  Abdominal aortic aneurysm (AAA) screening.** / A one-time screening for ages 77 to 103 years who are current or former smokers.  Skin self-exam. / Monthly.  Influenza vaccine. / Every year.  Tetanus, diphtheria, and acellular pertussis (Tdap/Td) vaccine.** / 1 dose of Td every 10 years.  Varicella vaccine.** / Consult your  health care provider.  Zoster vaccine.** / 1 dose for adults aged 34 years or older.  Pneumococcal 13-valent conjugate (PCV13) vaccine.** / Consult your health care provider.  Pneumococcal polysaccharide (PPSV23) vaccine.** / 1 dose for all adults aged 2 years and older.  Meningococcal vaccine.** / Consult your health care provider.  Hepatitis A vaccine.** / Consult your health care provider.  Hepatitis B vaccine.** / Consult your health care provider.  Haemophilus influenzae type b (Hib) vaccine.** / Consult your health care provider. **Family history and personal history of risk and conditions may change your health care provider's recommendations. Document Released: 06/26/2001 Document Revised: 05/05/2013 Document Reviewed: 09/25/2010 [facility] Patient Information 2015 [place], [place]. This information is not intended to replace advice given to you by your health care provider. Make sure you discuss any questions you have with your health care provider.

## 2015-06-17 NOTE — Progress Notes (Signed)
Subjective:    Patient ID: Emily Thompson, female    DOB: 1945/09/01, 70 y.o.   MRN: [id number]  DOS:  06/17/2015 Type of visit - description : cpx Interval history: no major concerns. Good compliance with medications   Review of Systems Constitutional: No fever. No chills. No unexplained wt changes. No unusual sweats  HEENT: No dental problems, no ear discharge, no facial swelling, no voice changes. No eye discharge, no eye  redness , no  intolerance to light   Respiratory: No wheezing , no  difficulty breathing. No cough , no mucus production  Cardiovascular: No CP, no leg swelling , no  Palpitations  GI: no nausea, no vomiting, no diarrhea , no  abdominal pain.  No blood in the stools. No dysphagia, no odynophagia    Endocrine: No polyphagia, no polyuria , no polydipsia  GU: No dysuria, gross hematuria, difficulty urinating. No urinary urgency, no frequency.  Musculoskeletal: No joint swellings or unusual aches or pains  Skin: No change in the color of the skin, palor , no  Rash  Allergic, immunologic: No environmental allergies , no  food allergies  Neurological: No dizziness no  syncope. No headaches. No diplopia, no slurred, no slurred speech, no motor deficits, no facial  Numbness  Hematological: No enlarged lymph nodes, no easy bruising , no unusual bleedings  Psychiatry: No suicidal ideas, no hallucinations, no beavior problems, no confusion. Occasional anxiety   Past Medical History  Diagnosis Date  . GERD (gastroesophageal reflux disease)   . Osteopenia   . SVT (supraventricular tachycardia) (HCC)     used to see Dr Rex Kras   . Dyslipidemia   . Hypertension   . Melanoma ([place]) 2012    righ side face; basal cell (nose)   . BCC (basal cell carcinoma of skin) 2013, 2016  . Breast cancer ([place]) 2003    chemo, radiation, lumpectomy, reconstructive surgery   . Complication of anesthesia   . PONV (postoperative nausea and vomiting)   . Endometrial cancer  [facility])     Past Surgical History  Procedure Laterality Date  . Breast lumpectomy Left 07/2001  . Breast reconstruction  2005  . Tubal ligation  1978  . Vulva /perineum biopsy      PAPILLOMA  . Cesarean section  1977  . Tonsillectomy and adenoidectomy  1952  . Nose surgery      basal cell carcinoma removed  . Basal cell carcinoma excision    . Skin cancer excision      melanoma  . Colonoscopy w/ polypectomy  2013    NEXT DUE IN 5 YRS  . Transthoracic echocardiogram  12/2005    EF 50-55%; mild MR; trace TR  . Nm myocar perf wall motion  01/2006    bruce myoview; no evidence of inducible ischemia, anterior wall thinning without evidence of ischemia; post-stress EF 88%; low risk scan   . Robotic assisted total hysterectomy with bilateral salpingo oopherectomy Bilateral 01/27/2015    Procedure: ROBOTIC ASSISTED TOTAL HYSTERECTOMY WITH BILATERAL SALPINGO OOPHORECTOMY AND SENTINAL NODE BIOPSY;  Surgeon: Everitt Amber, MD;  Location: [facility];  Service: Gynecology;  Laterality: Bilateral;    Social History   Social History  . Marital Status: Widowed    Spouse Name: N/A  . Number of Children: 2  . Years of Education: BSN   Occupational History  . 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 Smoking status: Former Smoker --  10 years    Types: Cigarettes    Quit date: 05/20/1973  . Smokeless tobacco: Never Used  . Alcohol Use: 2.4 oz/week    2 Glasses of wine, 2 Cans of beer per week     Comment: 4 A WEEK  . Drug Use: No  . Sexual Activity: Yes    Birth Control/ Protection: Post-menopausal   Other Topics Concern  . Not on file   Social History Narrative   Widow, 1 adopted and 1 natural child   Live by herself     Family History  Problem Relation Age of Onset  . Hypertension Mother   . Hypertension Father   . Stroke Father     hemorrhagic CVA at age 10  . Colon cancer Maternal Aunt     dx at age 68s  . Diabetes Brother   . Breast cancer Neg  Hx   . Bipolar disorder Brother   . Thyroid disease Mother     M and B (?)       Medication List       This list is accurate as of: 06/17/15  5:31 PM.  Always use your most recent med list.               aspirin 81 MG tablet  Take 81 mg by mouth daily.     CALCIUM-VITAMIN D-VITAMIN K PO  Take 1,200 mg by mouth daily.     diltiazem 240 MG 24 hr capsule  Commonly known as:  CARDIZEM CD  Take 1 capsule (240 mg total) by mouth daily.     ezetimibe 10 MG tablet  Commonly known as:  ZETIA  Take 1 tablet (10 mg total) by mouth daily.     HYDROcodone-homatropine 5-1.5 MG/5ML syrup  Commonly known as:  HYCODAN  Take 5 mLs by mouth every 8 (eight) hours as needed for cough.     lansoprazole 15 MG capsule  Commonly known as:  PREVACID  Take 15 mg by mouth daily at 12 noon.     PRESERVISION AREDS 2 PO  Take 1 capsule by mouth daily.     riboflavin 100 MG Tabs tablet  Commonly known as:  VITAMIN B-2  Take 200 mg by mouth 2 (two) times daily.     telmisartan 40 MG tablet  Commonly known as:  MICARDIS  Take 1 tablet (40 mg total) by mouth every morning.     TURMERIC PO  Take 1 tablet by mouth daily. Reported on 06/17/2015           Objective:   Physical Exam BP 118/64 mmHg  Pulse 79  Temp(Src) 97.7 F (36.5 C) (Oral)  Ht 5' (1.524 m)  Wt 143 lb 6 oz (65.034 kg)  BMI 28.00 kg/m2  SpO2 98%  LMP 05/14/1997  General:   Well developed, well nourished . NAD.  Neck:   No  thyromegaly , normal carotid pulse HEENT:  Normocephalic . Face symmetric, atraumatic Lungs:  CTA B Normal respiratory effort, no intercostal retractions, no accessory muscle use. Heart: RRR,  no murmur.  No pretibial edema bilaterally  Abdomen:  Not distended, soft, non-tender. No rebound or rigidity. Skin: Exposed areas without rash. Not pale. Not jaundice Neurologic:  alert & oriented X3.  Speech normal, gait appropriate for age and unassisted Strength symmetric and appropriate for age.    Psych: Cognition and judgment appear intact.  Cooperative with normal attention span and concentration.  Behavior appropriate. No anxious or depressed appearing.  Assessment & Plan:   Assessment HTN Hyperlipidemia  GERD Osteopenia --dexa 2016 (per gyn) DJD-- motrin prn Cough-- hycodan prn (tolerates well small doses hydrocodone) Oncology: --Breast cancer 2003 --Endometrial cancer, H-BSO, will see hematology x 5 years --Melanoma, BCC  H/o SVT  HOH  PLAN: HTN: We'll control, very rarely BP is elevated. No change Hyperlipidemia: On Zetia, compliance has increased, taking Zetia daily, check FLP GERD: Well controlled on Prevacid, not taking H2B. DJD: Meloxicam did not work better than ibuprofen. On ibuprofen as needed Cough: Refill hycodan RTC one year

## 2015-07-01 ENCOUNTER — Encounter: Payer: Self-pay | Admitting: Internal Medicine

## 2015-07-01 ENCOUNTER — Ambulatory Visit (INDEPENDENT_AMBULATORY_CARE_PROVIDER_SITE_OTHER): Payer: PPO | Admitting: Internal Medicine

## 2015-07-01 VITALS — BP 128/78 | HR 67 | Temp 98.0°F | Ht 60.0 in | Wt 146.5 lb

## 2015-07-01 DIAGNOSIS — Z09 Encounter for follow-up examination after completed treatment for conditions other than malignant neoplasm: Secondary | ICD-10-CM

## 2015-07-01 DIAGNOSIS — I10 Essential (primary) hypertension: Secondary | ICD-10-CM

## 2015-07-01 MED ORDER — EZETIMIBE 10 MG PO TABS: 10.0000 mg | ORAL_TABLET | Freq: Every day | ORAL | Status: DC

## 2015-07-01 MED ORDER — DILTIAZEM HCL ER BEADS 360 MG PO CP24: 360.0000 mg | ORAL_CAPSULE | Freq: Every day | ORAL | Status: DC

## 2015-07-01 MED ORDER — TELMISARTAN 40 MG PO TABS: 40.0000 mg | ORAL_TABLET | Freq: Every evening | ORAL | Status: DC

## 2015-07-01 NOTE — Progress Notes (Signed)
Subjective:    Patient ID: Emily Thompson, female    DOB: 12/11/1945, 70 y.o.   MRN: [id number]  DOS:  07/01/2015 Type of visit - description : Acute visit Interval history:  Ambulatory BPs range---  161/64, 127/62 pulse in the 70s patient somewhat concerned and would like better control.    Review of Systems Admits to recent  stress, lost his brother last week. Denies chest pain or difficulty breathing No nausea, vomiting, diarrhea. No headaches. Lifestyle continue to be healthy  Past Medical History  Diagnosis Date  . GERD (gastroesophageal reflux disease)   . Osteopenia   . SVT (supraventricular tachycardia) (HCC)     used to see Dr Rex Kras   . Dyslipidemia   . Hypertension   . Melanoma ([place]) 2012    righ side face; basal cell (nose)   . BCC (basal cell carcinoma of skin) 2013, 2016  . Breast cancer ([place]) 2003    chemo, radiation, lumpectomy, reconstructive surgery   . Complication of anesthesia   . PONV (postoperative nausea and vomiting)   . Endometrial cancer [facility])     Past Surgical History  Procedure Laterality Date  . Breast lumpectomy Left 07/2001  . Breast reconstruction  2005  . Tubal ligation  1978  . Vulva /perineum biopsy      PAPILLOMA  . Cesarean section  1977  . Tonsillectomy and adenoidectomy  1952  . Nose surgery      basal cell carcinoma removed  . Basal cell carcinoma excision    . Skin cancer excision      melanoma  . Colonoscopy w/ polypectomy  2013    NEXT DUE IN 5 YRS  . Transthoracic echocardiogram  12/2005    EF 50-55%; mild MR; trace TR  . Nm myocar perf wall motion  01/2006    bruce myoview; no evidence of inducible ischemia, anterior wall thinning without evidence of ischemia; post-stress EF 88%; low risk scan   . Robotic assisted total hysterectomy with bilateral salpingo oopherectomy Bilateral 01/27/2015    Procedure: ROBOTIC ASSISTED TOTAL HYSTERECTOMY WITH BILATERAL SALPINGO OOPHORECTOMY AND SENTINAL NODE BIOPSY;  Surgeon:  Everitt Amber, MD;  Location: [facility];  Service: Gynecology;  Laterality: Bilateral;    Social History   Social History  . Marital Status: Widowed    Spouse Name: N/A  . Number of Children: 2  . Years of Education: BSN   Occupational History  . 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 Smoking status: Former Smoker -- 10 years    Types: Cigarettes    Quit date: 05/20/1973  . Smokeless tobacco: Never Used  . Alcohol Use: 2.4 oz/week    2 Glasses of wine, 2 Cans of beer per week     Comment: 4 A WEEK  . Drug Use: No  . Sexual Activity: Yes    Birth Control/ Protection: Post-menopausal   Other Topics Concern  . Not on file   Social History Narrative   Widow, 1 adopted and 1 natural child   Live by herself        Medication List       This list is accurate as of: 07/01/15 11:59 PM.  Always use your most recent med list.               aspirin 81 MG tablet  Take 81 mg by mouth daily.     CALCIUM-VITAMIN D-VITAMIN K PO  Take  1,200 mg by mouth daily.     diltiazem 360 MG 24 hr capsule  Commonly known as:  TIAZAC  Take 1 capsule (360 mg total) by mouth daily.     ezetimibe 10 MG tablet  Commonly known as:  ZETIA  Take 1 tablet (10 mg total) by mouth daily.     lansoprazole 15 MG capsule  Commonly known as:  PREVACID  Take 15 mg by mouth daily at 12 noon.     PRESERVISION AREDS 2 PO  Take 1 capsule by mouth daily.     riboflavin 100 MG Tabs tablet  Commonly known as:  VITAMIN B-2  Take 200 mg by mouth 2 (two) times daily.     telmisartan 40 MG tablet  Commonly known as:  MICARDIS  Take 1 tablet (40 mg total) by mouth every evening.           Objective:   Physical Exam BP 128/78 mmHg  Pulse 67  Temp(Src) 98 F (36.7 C) (Oral)  Ht 5' (1.524 m)  Wt 146 lb 8 oz (66.452 kg)  BMI 28.61 kg/m2  SpO2 98%  LMP 05/14/1997 General:   Well developed, well nourished . NAD.  HEENT:  Normocephalic . Face symmetric,  atraumatic Neurologic:  alert & oriented X3.  Speech normal, gait appropriate for age and unassisted Psych--  Cognition and judgment appear intact.  Cooperative with normal attention span and concentration.  Behavior appropriate. No anxious or depressed appearing.      Assessment & Plan:   Assessment HTN Hyperlipidemia -- statin intolerant  GERD Osteopenia --dexa 2016 (per gyn) DJD-- motrin prn Cough-- hycodan prn (tolerates well small doses hydrocodone) Oncology: --Breast cancer 2003 --Endometrial cancer, H-BSO, will see hematology x 5 years --Melanoma, BCC  H/o SVT  HOH  PLAN: HTN: Ambulatory BPs slightly elevated up to the 160s. + stress, lost her brother, states that overall does not think she will need help with the stress long-term. Does not desire diuretics. We discussed options of increasing  micardis vs CCBs, elected to increase diltiazem from 240 >> 360. Will watch her BPs and pulse , see AVS.

## 2015-07-01 NOTE — Patient Instructions (Signed)
Increase diltiazem to 360 mg a day  Check the  blood pressure  daily Be sure your blood pressure is between 110/65 and  145/85. If it is consistently higher or lower, let me know Also check your heart rate, if is consistently less than 50-55 or if you have symptoms, go back to diltiazem 240

## 2015-07-01 NOTE — Progress Notes (Signed)
Pre visit review using our clinic review tool, if applicable. No additional management support is needed unless otherwise documented below in the visit note. 

## 2015-07-03 DIAGNOSIS — Z09 Encounter for follow-up examination after completed treatment for conditions other than malignant neoplasm: Secondary | ICD-10-CM

## 2015-07-03 HISTORY — DX: Encounter for follow-up examination after completed treatment for conditions other than malignant neoplasm: Z09

## 2015-07-03 NOTE — Assessment & Plan Note (Signed)
HTN: Ambulatory BPs slightly elevated up to the 160s. + stress, lost her brother, states that overall does not think she will need help with the stress long-term. Does not desire diuretics. We discussed options of increasing  micardis vs CCBs, elected to increase diltiazem from 240 >> 360. Will watch her BPs and pulse , see AVS.

## 2015-07-14 ENCOUNTER — Encounter: Payer: Self-pay | Admitting: Internal Medicine

## 2015-07-14 ENCOUNTER — Telehealth: Payer: Self-pay | Admitting: Internal Medicine

## 2015-07-14 MED ORDER — AZITHROMYCIN 250 MG PO TABS
ORAL_TABLET | ORAL | Status: DC
Start: 2015-07-14 — End: 2015-08-19

## 2015-07-14 NOTE — Telephone Encounter (Signed)
Please advise 

## 2015-07-14 NOTE — Telephone Encounter (Signed)
Please send a zpack, rec to see a local MD is sx severe

## 2015-07-14 NOTE — Telephone Encounter (Signed)
Pt feels that she may have a sinus infection coming on since the trees have started blooming. She is leaving the country on Sunday and is wondering is Dr. Larose Kells would be comfortable prescribing her an abx to have on hand in case it should get worse while she is traveling. Pt ph# [phone]  Pharmacy:     [facility] # [address], [place]

## 2015-07-14 NOTE — Telephone Encounter (Signed)
Spoke w/ Pt, informed her that Z-pak has been sent to [place], and instructed her how to take medication. Pt verbalized understanding. Rx sent to Z-pak.

## 2015-08-19 ENCOUNTER — Ambulatory Visit: Payer: PPO | Attending: Gynecologic Oncology | Admitting: Gynecologic Oncology

## 2015-08-19 ENCOUNTER — Encounter: Payer: Self-pay | Admitting: Gynecologic Oncology

## 2015-08-19 VITALS — BP 142/65 | HR 64 | Temp 98.0°F | Resp 18 | Ht 60.0 in | Wt 145.9 lb

## 2015-08-19 DIAGNOSIS — E785 Hyperlipidemia, unspecified: Secondary | ICD-10-CM | POA: Insufficient documentation

## 2015-08-19 DIAGNOSIS — Z8542 Personal history of malignant neoplasm of other parts of uterus: Secondary | ICD-10-CM

## 2015-08-19 DIAGNOSIS — I471 Supraventricular tachycardia: Secondary | ICD-10-CM | POA: Diagnosis not present

## 2015-08-19 DIAGNOSIS — Z90710 Acquired absence of both cervix and uterus: Secondary | ICD-10-CM | POA: Diagnosis not present

## 2015-08-19 DIAGNOSIS — Z9889 Other specified postprocedural states: Secondary | ICD-10-CM | POA: Diagnosis not present

## 2015-08-19 DIAGNOSIS — M8580 Other specified disorders of bone density and structure, unspecified site: Secondary | ICD-10-CM | POA: Insufficient documentation

## 2015-08-19 DIAGNOSIS — Z853 Personal history of malignant neoplasm of breast: Secondary | ICD-10-CM | POA: Insufficient documentation

## 2015-08-19 DIAGNOSIS — K219 Gastro-esophageal reflux disease without esophagitis: Secondary | ICD-10-CM | POA: Diagnosis not present

## 2015-08-19 DIAGNOSIS — I10 Essential (primary) hypertension: Secondary | ICD-10-CM | POA: Insufficient documentation

## 2015-08-19 DIAGNOSIS — Z923 Personal history of irradiation: Secondary | ICD-10-CM | POA: Diagnosis not present

## 2015-08-19 DIAGNOSIS — Z9221 Personal history of antineoplastic chemotherapy: Secondary | ICD-10-CM | POA: Insufficient documentation

## 2015-08-19 DIAGNOSIS — Z85820 Personal history of malignant melanoma of skin: Secondary | ICD-10-CM | POA: Insufficient documentation

## 2015-08-19 DIAGNOSIS — C541 Malignant neoplasm of endometrium: Secondary | ICD-10-CM | POA: Insufficient documentation

## 2015-08-19 DIAGNOSIS — Z90722 Acquired absence of ovaries, bilateral: Secondary | ICD-10-CM | POA: Insufficient documentation

## 2015-08-19 DIAGNOSIS — Z9079 Acquired absence of other genital organ(s): Secondary | ICD-10-CM | POA: Diagnosis not present

## 2015-08-19 NOTE — Patient Instructions (Signed)
Plan to follow up with Dr. Toney Rakes in six months and Dr. Denman George in one year.  Please call our office after you see Dr. Toney Rakes to schedule with Dr. Denman George.

## 2015-08-19 NOTE — Progress Notes (Signed)
POSTOPERATIVE FOLLOWUP Assessment:    70 y.o. year old with Stage IA Grade 1 endometrioid endometrial cancer.   S/p robotic hysterectomy, BSO, sentinel lymph node biopsy on 01/27/15. no LVSI, 40% myometrial invasion, negative pelvic washings and negative lymph nodes.   Plan: 1) No evidence of disease on today's visit.  2)  Return to clinic in 6 months to see Dr Toney Rakes, and in 1 year to see me.  HPI:  Emily Thompson is a 70 y.o. year old G1P1001 initially seen in consultation on 01/14/15 referred by Dr Toney Rakes for grade 1 endometrial cancer.  She then underwent a robotic hysterectomy, BSO and sentinel lymph node biopsy on [id number] without complications.  Her postoperative course was uncomplicated.  Her final pathologic diagnosis is a Stage IA Grade 1 endometrioid endometrial cancer with 40% lymphovascular space invasion, 6/15 mm (40%) of myometrial invasion and negative lymph nodes.  Interval Hx:  She is doing well with no complaints or symptoms concerning for endometrial cancer recurrence. She recently returned from a trip to [place].  Past Medical History  Diagnosis Date  . GERD (gastroesophageal reflux disease)   . Osteopenia   . SVT (supraventricular tachycardia) (HCC)     used to see Dr Rex Kras   . Dyslipidemia   . Hypertension   . Melanoma ([place]) 2012    righ side face; basal cell (nose)   . BCC (basal cell carcinoma of skin) 2013, 2016  . Breast cancer ([place]) 2003    chemo, radiation, lumpectomy, reconstructive surgery   . Complication of anesthesia   . PONV (postoperative nausea and vomiting)   . Endometrial cancer [facility])    Past Surgical History  Procedure Laterality Date  . Breast lumpectomy Left 07/2001  . Breast reconstruction  2005  . Tubal ligation  1978  . Vulva /perineum biopsy      PAPILLOMA  . Cesarean section  1977  . Tonsillectomy and adenoidectomy  1952  . Nose surgery      basal cell carcinoma removed  . Basal cell carcinoma excision    . Skin  cancer excision      melanoma  . Colonoscopy w/ polypectomy  2013    NEXT DUE IN 5 YRS  . Transthoracic echocardiogram  12/2005    EF 50-55%; mild MR; trace TR  . Nm myocar perf wall motion  01/2006    bruce myoview; no evidence of inducible ischemia, anterior wall thinning without evidence of ischemia; post-stress EF 88%; low risk scan   . Robotic assisted total hysterectomy with bilateral salpingo oopherectomy Bilateral 01/27/2015    Procedure: ROBOTIC ASSISTED TOTAL HYSTERECTOMY WITH BILATERAL SALPINGO OOPHORECTOMY AND SENTINAL NODE BIOPSY;  Surgeon: Everitt Amber, MD;  Location: [facility];  Service: Gynecology;  Laterality: Bilateral;   Family History  Problem Relation Age of Onset  . Hypertension Mother   . Thyroid disease Mother     M and B (?)  . Hypertension Father   . Stroke Father     hemorrhagic CVA at age 26  . Colon cancer Maternal Aunt     dx at age 71s  . Breast cancer Neg Hx   . Bipolar disorder Brother   . Diabetes Brother    Social History   Social History  . Marital Status: Widowed    Spouse Name: N/A  . Number of Children: 2  . Years of Education: BSN   Occupational History  . 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 Smoking status: Former Smoker -- 10 years    Types: Cigarettes    Quit date: 05/20/1973  . Smokeless tobacco: Never Used  . Alcohol Use: 2.4 oz/week    2 Glasses of wine, 2 Cans of beer per week     Comment: 4 A WEEK  . Drug Use: No  . Sexual Activity: Yes    Birth Control/ Protection: Post-menopausal   Other Topics Concern  . Not on file   Social History Narrative   Widow, 1 adopted and 1 natural child   Live by herself   Allergies  Allergen Reactions  . Dextromethorphan     Per pt, increases BP  . Hydrocodone     Wires her up SEVERE NAUSEA  . Statins     Headache, hot flashes  . Morphine And Related     Could not move SEVERE NAUSEA  . Tape Rash   Current Outpatient Prescriptions on File  Prior to Visit  Medication Sig Dispense Refill  . aspirin 81 MG tablet Take 81 mg by mouth daily.     Marland Kitchen CALCIUM-VITAMIN D-VITAMIN K PO Take 1,200 mg by mouth daily.    Marland Kitchen diltiazem (TIAZAC) 360 MG 24 hr capsule Take 1 capsule (360 mg total) by mouth daily. 90 capsule 1  . ezetimibe (ZETIA) 10 MG tablet Take 1 tablet (10 mg total) by mouth daily. 90 tablet 1  . lansoprazole (PREVACID) 15 MG capsule Take 15 mg by mouth daily at 12 noon.    . Multiple Vitamins-Minerals (PRESERVISION AREDS 2 PO) Take 1 capsule by mouth daily.    . riboflavin (VITAMIN B-2) 100 MG TABS Take 200 mg by mouth 2 (two) times daily.     Marland Kitchen telmisartan (MICARDIS) 40 MG tablet Take 1 tablet (40 mg total) by mouth every evening. 90 tablet 1   No current facility-administered medications on file prior to visit.     Review of systems: Constitutional:  She has no weight gain or weight loss. She has no fever or chills. Eyes: No blurred vision Ears, Nose, Mouth, Throat: No dizziness, headaches or changes in hearing. No mouth sores. Cardiovascular: No chest pain, palpitations or edema. Respiratory:  No shortness of breath, wheezing or cough Gastrointestinal: She has normal bowel movements without diarrhea or constipation. She denies any nausea or vomiting. She denies blood in her stool or heart burn. Genitourinary:  She denies pelvic pain, pelvic pressure or changes in her urinary function. She has no hematuria, dysuria, or incontinence. She has no irregular vaginal bleeding or vaginal discharge Musculoskeletal: Denies muscle weakness or joint pains.  Skin:  She has no skin changes, rashes or itching Neurological:  Denies dizziness or headaches. No neuropathy, no numbness or tingling. Psychiatric:  She denies depression or anxiety. Hematologic/Lymphatic:   No easy bruising or bleeding   Physical Exam: Blood pressure 142/65, pulse 64, temperature 98 F (36.7 C), temperature source Oral, resp. rate 18, height 5' (1.524 m),  weight 145 lb 14.4 oz (66.18 kg), last menstrual period 05/14/1997, SpO2 98 %. General: Well dressed, well nourished in no apparent distress.   HEENT:  Normocephalic and atraumatic, no lesions.  Extraocular muscles intact. Sclerae anicteric. Pupils equal, round, reactive. No mouth sores or ulcers. Thyroid is normal size, not nodular, midline. Skin:  No lesions or rashes. Abdomen:  Soft, nontender, nondistended.  No palpable masses.  No hepatosplenomegaly.  No ascites. Normal bowel sounds.  No hernias.  Incisions are well healed Genitourinary: Normal EGBUS  Vaginal cuff smooth and regular.  No bleeding or discharge.  No cul de sac fullness. Extremities: No cyanosis, clubbing or edema.  No calf tenderness or erythema. No palpable cords. Psychiatric: Mood and affect are appropriate. Neurological: Awake, alert and oriented x 3. Sensation is intact, no neuropathy.  Musculoskeletal: No pain, normal strength and range of motion.    Donaciano Eva, MD

## 2015-09-28 ENCOUNTER — Telehealth: Payer: Self-pay | Admitting: Internal Medicine

## 2015-09-28 DIAGNOSIS — R911 Solitary pulmonary nodule: Secondary | ICD-10-CM

## 2015-09-28 NOTE — Telephone Encounter (Signed)
LMOM informing Pt that she is due for repeat CT of the chest and that she will receive a call to schedule at her convenience. Instructed her to call if questions or concerns.

## 2015-09-28 NOTE — Telephone Encounter (Signed)
Advise patient, she is due for a CT chest, I already ordered.

## 2015-09-29 ENCOUNTER — Other Ambulatory Visit: Payer: Self-pay

## 2015-09-29 DIAGNOSIS — R911 Solitary pulmonary nodule: Secondary | ICD-10-CM

## 2015-09-30 ENCOUNTER — Ambulatory Visit (HOSPITAL_BASED_OUTPATIENT_CLINIC_OR_DEPARTMENT_OTHER)
Admission: RE | Admit: 2015-09-30 | Discharge: 2015-09-30 | Disposition: A | Discharge status: Home / Self Care | Payer: PPO | Source: Ambulatory Visit | Attending: Internal Medicine | Admitting: Internal Medicine

## 2015-09-30 ENCOUNTER — Ambulatory Visit (HOSPITAL_BASED_OUTPATIENT_CLINIC_OR_DEPARTMENT_OTHER): Payer: PPO

## 2015-09-30 DIAGNOSIS — R911 Solitary pulmonary nodule: Secondary | ICD-10-CM | POA: Diagnosis not present

## 2015-11-08 ENCOUNTER — Other Ambulatory Visit: Payer: Self-pay | Admitting: Internal Medicine

## 2015-11-08 NOTE — Telephone Encounter (Signed)
Okay meloxicam No. 30 and one refill. Please advise patient that if she decides to take meloxicam then she does not need to be taking ibuprofen, naproxen or other OTCs.

## 2015-11-08 NOTE — Telephone Encounter (Signed)
Rx sent w/ notes not to take any additional NSAID or OTC w/ Rx.

## 2015-11-08 NOTE — Telephone Encounter (Signed)
Pt is requesting refill on Meloxicam. Not longer on med list.  Last OV: 07/01/2015 Last Fill: 05/30/2015 #30 and 0RF  Please advise.

## 2015-11-14 ENCOUNTER — Encounter: Payer: Self-pay | Admitting: Internal Medicine

## 2015-11-14 NOTE — Telephone Encounter (Signed)
Melissa-- please advise in Dr Larose Kells absence?

## 2015-11-23 ENCOUNTER — Ambulatory Visit (INDEPENDENT_AMBULATORY_CARE_PROVIDER_SITE_OTHER): Payer: PPO | Admitting: Internal Medicine

## 2015-11-23 ENCOUNTER — Encounter: Payer: Self-pay | Admitting: Internal Medicine

## 2015-11-23 VITALS — BP 124/68 | HR 71 | Temp 97.8°F | Ht 60.0 in | Wt 148.2 lb

## 2015-11-23 DIAGNOSIS — M545 Low back pain: Secondary | ICD-10-CM | POA: Diagnosis not present

## 2015-11-23 DIAGNOSIS — Z09 Encounter for follow-up examination after completed treatment for conditions other than malignant neoplasm: Secondary | ICD-10-CM

## 2015-11-23 NOTE — Patient Instructions (Signed)
    GO TO THE FRONT DESK Schedule your next appointment for a  Check up in 3 months  meloxicam 2 tabs a day x 10 days with food, then only as needed   Call if no better in 3-4 weeks for  a ortho referral

## 2015-11-23 NOTE — Progress Notes (Signed)
Subjective:    Patient ID: Emily Thompson, female    DOB: October 16, 1945, 70 y.o.   MRN: [id number]  DOS:  11/23/2015 Type of visit - description : Acute visit Interval history: Long history of right or left back pain on-off Lately, the symptoms are more prominent, located lateral from R SI joint w/some radiation to the lateral hip. Symptoms are not necessarily worse when she exercises or walks. No recent injury. Has some pretibial paresthesias on and off. Taking meloxicam 2 tablets daily as needed with some help.    Review of Systems Denies fever chills No bladder or bowel incontinence   Past Medical History  Diagnosis Date  . GERD (gastroesophageal reflux disease)   . Osteopenia   . SVT (supraventricular tachycardia) (HCC)     used to see Dr Rex Kras   . Dyslipidemia   . Hypertension   . Melanoma ([place]) 2012    righ side face; basal cell (nose)   . BCC (basal cell carcinoma of skin) 2013, 2016  . Breast cancer ([place]) 2003    chemo, radiation, lumpectomy, reconstructive surgery   . Complication of anesthesia   . PONV (postoperative nausea and vomiting)   . Endometrial cancer [facility])     Past Surgical History  Procedure Laterality Date  . Breast lumpectomy Left 07/2001  . Breast reconstruction  2005  . Tubal ligation  1978  . Vulva /perineum biopsy      PAPILLOMA  . Cesarean section  1977  . Tonsillectomy and adenoidectomy  1952  . Nose surgery      basal cell carcinoma removed  . Basal cell carcinoma excision    . Skin cancer excision      melanoma  . Colonoscopy w/ polypectomy  2013    NEXT DUE IN 5 YRS  . Transthoracic echocardiogram  12/2005    EF 50-55%; mild MR; trace TR  . Nm myocar perf wall motion  01/2006    bruce myoview; no evidence of inducible ischemia, anterior wall thinning without evidence of ischemia; post-stress EF 88%; low risk scan   . Robotic assisted total hysterectomy with bilateral salpingo oopherectomy Bilateral 01/27/2015    Procedure:  ROBOTIC ASSISTED TOTAL HYSTERECTOMY WITH BILATERAL SALPINGO OOPHORECTOMY AND SENTINAL NODE BIOPSY;  Surgeon: Everitt Amber, MD;  Location: [facility];  Service: Gynecology;  Laterality: Bilateral;    Social History   Social History  . Marital Status: Widowed    Spouse Name: N/A  . Number of Children: 2  . Years of Education: BSN   Occupational History  . 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 Smoking status: Former Smoker -- 10 years    Types: Cigarettes    Quit date: 05/20/1973  . Smokeless tobacco: Never Used  . Alcohol Use: 2.4 oz/week    2 Glasses of wine, 2 Cans of beer per week     Comment: 4 A WEEK  . Drug Use: No  . Sexual Activity: Yes    Birth Control/ Protection: Post-menopausal   Other Topics Concern  . Not on file   Social History Narrative   Widow, 1 adopted and 1 natural child   Live by herself        Medication List       This list is accurate as of: 11/23/15 11:59 PM.  Always use your most recent med list.               aspirin  81 MG tablet  Take 81 mg by mouth daily.     CALCIUM-VITAMIN D-VITAMIN K PO  Take 1,200 mg by mouth daily.     cholecalciferol 1000 units tablet  Commonly known as:  VITAMIN D  Take 1,000 Units by mouth daily.     diltiazem 360 MG 24 hr capsule  Commonly known as:  TIAZAC  Take 1 capsule (360 mg total) by mouth daily.     ezetimibe 10 MG tablet  Commonly known as:  ZETIA  Take 1 tablet (10 mg total) by mouth daily.     lansoprazole 15 MG capsule  Commonly known as:  PREVACID  Take 15 mg by mouth daily at 12 noon.     meloxicam 7.5 MG tablet  Commonly known as:  MOBIC  Take 15 mg by mouth daily as needed for pain.     PRESERVISION AREDS 2 PO  Take 1 capsule by mouth daily.     riboflavin 100 MG Tabs tablet  Commonly known as:  VITAMIN B-2  Take 200 mg by mouth 2 (two) times daily.     telmisartan 40 MG tablet  Commonly known as:  MICARDIS  Take 1 tablet (40 mg total)  by mouth every evening.           Objective:   Physical Exam BP 124/68 mmHg  Pulse 71  Temp(Src) 97.8 F (36.6 C) (Oral)  Ht 5' (1.524 m)  Wt 148 lb 4 oz (67.246 kg)  BMI 28.95 kg/m2  SpO2 96%  LMP 05/14/1997 General:   Well developed, well nourished . NAD.  HEENT:  Normocephalic . Face symmetric, atraumatic MSK: No TTP at the lumbar spine. Slightly TTP lateral from right SI joint. Mild tenderness on the trochanteric bursa, worse on the right Hips rotation normal Straight leg test negative Skin: Not pale. Not jaundice Neurologic:  alert & oriented X3.  Speech normal, gait appropriate for age and unassisted DTRs symmetric except for an absent right ankle jerk Psych--  Cognition and judgment appear intact.  Cooperative with normal attention span and concentration.  Behavior appropriate. No anxious or depressed appearing.      Assessment & Plan:   Assessment HTN Hyperlipidemia -- statin intolerant  GERD Osteopenia --dexa 2016 (per gyn) MSK: ---DJD,  NSAIDs prn --Back pain, saw ortho Dr Tommie Raymond ~ 2011, saw the spine center ~ 2015 , dx w/  spinal stenosis (no MRI, clinical dx ) Cough-- hycodan prn (tolerates well small doses hydrocodone) Oncology: --Breast cancer 2003 --Endometrial cancer, H-BSO, will see hematology x 5 years --Melanoma, BCC  Lung nodule, 56mm, per CTs, last CT 09/30/2015 : stable  H/o SVT  HOH  PLAN: Back pain: On and off back pain, left or right, getting slightly worse in the last few months. Exam is + for absent right ankle jerk, mild tenderness at the trochanteric bursa. Has a history of spinal stenosis dx years ago on clinical grounds. Etiology of current pain is not clear, could be spinal stenosis, back DJD, I am somewhat concerned about the absent ankle jerk. Declined  prednisone or a ortho referral today, we eventually agreed on the following: Meloxicam daily for 10 days, GI precautions. Physical therapy Call if not better for  orthopedic referral. RTC 3 months

## 2015-11-23 NOTE — Progress Notes (Signed)
Pre visit review using our clinic review tool, if applicable. No additional management support is needed unless otherwise documented below in the visit note. 

## 2015-11-24 NOTE — Assessment & Plan Note (Signed)
Back pain: On and off back pain, left or right, getting slightly worse in the last few months. Exam is + for absent right ankle jerk, mild tenderness at the trochanteric bursa. Has a history of spinal stenosis dx years ago on clinical grounds. Etiology of current pain is not clear, could be spinal stenosis, back DJD, I am somewhat concerned about the absent ankle jerk. Declined  prednisone or a ortho referral today, we eventually agreed on the following: Meloxicam daily for 10 days, GI precautions. Physical therapy Call if not better for orthopedic referral. RTC 3 months

## 2015-11-30 ENCOUNTER — Ambulatory Visit: Payer: PPO | Attending: Internal Medicine | Admitting: Physical Therapy

## 2015-11-30 DIAGNOSIS — M5440 Lumbago with sciatica, unspecified side: Secondary | ICD-10-CM | POA: Diagnosis not present

## 2015-11-30 DIAGNOSIS — M6281 Muscle weakness (generalized): Secondary | ICD-10-CM | POA: Diagnosis not present

## 2015-11-30 DIAGNOSIS — R293 Abnormal posture: Secondary | ICD-10-CM | POA: Diagnosis not present

## 2015-11-30 NOTE — Patient Instructions (Signed)
Piriformis Stretch, Supine    Lie supine, one ankle crossed onto opposite knee. Holding bottom leg behind knee, gently pull legs toward chest until stretch is felt in buttock of top leg. Hold _20-30__ seconds. For deeper stretch gently push top knee away from body.  Repeat _2-3__ times per session. Do _2-3__ sessions per day.  Copyright  VHI. All rights reserved.

## 2015-11-30 NOTE — Therapy (Signed)
[facility] [address]  [place], [place], [address] Phone: [phone]   Fax:  [phone]  Physical Therapy Evaluation  Patient Details  Name: Emily Thompson MRN: [id number] Date of Birth: [birth year] Referring Provider: Dr. Larose Kells  Encounter Date: 11/30/2015      PT End of Session - 11/30/15 1234    Visit Number 1   Number of Visits 12   Date for PT Re-Evaluation 01/11/16   PT Start Time 0927   PT Stop Time 1003   PT Time Calculation (min) 36 min   Activity Tolerance Patient tolerated treatment well   Behavior During Therapy [facility] for tasks assessed/performed      Past Medical History  Diagnosis Date  . GERD (gastroesophageal reflux disease)   . Osteopenia   . SVT (supraventricular tachycardia) (HCC)     used to see Dr Rex Kras   . Dyslipidemia   . Hypertension   . Melanoma ([place]) 2012    righ side face; basal cell (nose)   . BCC (basal cell carcinoma of skin) 2013, 2016  . Breast cancer ([place]) 2003    chemo, radiation, lumpectomy, reconstructive surgery   . Complication of anesthesia   . PONV (postoperative nausea and vomiting)   . Endometrial cancer [facility])     Past Surgical History  Procedure Laterality Date  . Breast lumpectomy Left 07/2001  . Breast reconstruction  2005  . Tubal ligation  1978  . Vulva /perineum biopsy      PAPILLOMA  . Cesarean section  1977  . Tonsillectomy and adenoidectomy  1952  . Nose surgery      basal cell carcinoma removed  . Basal cell carcinoma excision    . Skin cancer excision      melanoma  . Colonoscopy w/ polypectomy  2013    NEXT DUE IN 5 YRS  . Transthoracic echocardiogram  12/2005    EF 50-55%; mild MR; trace TR  . Nm myocar perf wall motion  01/2006    bruce myoview; no evidence of inducible ischemia, anterior wall thinning without evidence of ischemia; post-stress EF 88%; low risk scan   . Robotic assisted total hysterectomy with bilateral salpingo  oopherectomy Bilateral 01/27/2015    Procedure: ROBOTIC ASSISTED TOTAL HYSTERECTOMY WITH BILATERAL SALPINGO OOPHORECTOMY AND SENTINAL NODE BIOPSY;  Surgeon: Everitt Amber, MD;  Location: [facility];  Service: Gynecology;  Laterality: Bilateral;    There were no vitals filed for this visit.       Subjective Assessment - 11/30/15 0930    Subjective Pt is a 70 y/o female who presents to OPPT for at least 5 year history of LBP.  Pt states scoliosis at L3-5 and "it's within normal limits."  Pt with increased parasthesias in bil LEs (R>L) x 2-3 weeks.  Pt reports she is hoping to prolong MRI and surgery with physical therapy.  Pt has SI belt which has been somewhat helpful.   Limitations Sitting;Standing;Walking   How long can you sit comfortably? depends on chair: no recliner; uses stool for foot support; up to 1 hour well supported   How long can you stand comfortably? 30 min-1 hour   How long can you walk comfortably? no trouble   Patient Stated Goals avoid invasive medical procedures/medication; exercises to help stabilize and stretch   Currently in Pain? Yes   Pain Score 0-No pain  "but I'm aware of it"   Pain Location Back   Pain Orientation Right;Left;Mid;Lower  Pain Descriptors / Indicators Discomfort;Aching;Dull   Pain Type Chronic pain;Acute pain   Pain Radiating Towards R hip; reports RLE occasionally feels "heavy"; buttocks in L side; R foot arch aching   Pain Onset More than a month ago   Pain Frequency Intermittent   Aggravating Factors  sitting, standing, twisting   Pain Relieving Factors lying flat; flexion based exercises, trunk rotation            [facility] PT Assessment - 11/30/15 0940    Assessment   Medical Diagnosis LBP   Referring Provider Dr. Larose Kells   Onset Date/Surgical Date 11/14/15   Next MD Visit 02/23/16   Prior Therapy in 2012 for 2 visits   Precautions   Precautions None   Restrictions   Weight Bearing Restrictions No   Balance Screen   Has the patient fallen  in the past 6 months No   Has the patient had a decrease in activity level because of a fear of falling?  No   Is the patient reluctant to leave their home because of a fear of falling?  No   Home Environment   Living Environment Private residence   Living Arrangements Alone   Type of [place] Access Level entry   Home Layout Two level;Bed/bath upstairs   Additional Comments pt reports no problem with stairs   Prior Function   Level of Independence Independent   Vocation Retired   Leisure tai chi, goes to gym 4 days/wk; read, travel   Observation/Other Assessments   Focus on Therapeutic Outcomes (FOTO)  69 (31% limited; predicted 27% limited)   Posture/Postural Control   Posture/Postural Control Postural limitations   Postural Limitations Right pelvic obliquity;Rounded Shoulders;Forward head;Increased thoracic kyphosis   AROM   AROM Assessment Site Lumbar   Lumbar Flexion 96   Lumbar - Right Side Bend 36   Lumbar - Left Side Bend 32   Strength   Overall Strength Comments hip extension and knee flexion not tested due to inability to lie prone (pt has L breast implant)   Strength Assessment Site Hip;Knee;Ankle   Right Hip Flexion 3+/5   Right Hip ABduction 3/5   Right Hip ADduction 3+/5   Left Hip Flexion 4/5   Left Hip ABduction 3+/5   Left Hip ADduction 4-/5   Right Knee Extension 5/5   Left Knee Extension 5/5   Right Ankle Dorsiflexion 5/5   Left Ankle Dorsiflexion 5/5   Flexibility   Piriformis tightness bil L > R   Palpation   Palpation comment tenderness bil SIJ and just laterally; tenderness in bil piriformis; R hip slightly elevated                   [facility] Adult PT Treatment/Exercise - 11/30/15 0940    Exercises   Exercises Knee/Hip;Lumbar   Lumbar Exercises: Stretches   Piriformis Stretch 1 rep;30 seconds   Piriformis Stretch Limitations instructed in how to perform for home                PT Education - 11/30/15 1232     Education provided Yes   Education Details HEP - cont current flexion and add piriformis, POC   Person(s) Educated Patient   Methods Explanation;Demonstration;Handout   Comprehension Verbalized understanding;Returned demonstration;Need further instruction             PT Long Term Goals - 11/30/15 1250    PT LONG TERM GOAL #1   Title independent with HEP (01/11/16)  Time 4   Period Weeks   Status New   PT LONG TERM GOAL #2   Title report ability to stand for > 30 min without increase in pain (01/11/16)   Time 4   Period Weeks   Status New   PT LONG TERM GOAL #3   Title verbalize understanding of posture/body mechanics to decrease risk of reinjury (01/11/16)   Time 4   Period Weeks   Status New               Plan - 11/30/15 1235    Clinical Impression Statement Pt is a 70 y/o female who presents to OPPT for a moderate complexity PT evaluation of LBP with radicular symptoms.  Pt demonstrates pain, decreased strength, postural abnormalities affecting function.  Will benefit from PT to maximize function and hopefully avoid further medical intervention.   Rehab Potential Good   PT Frequency 1x / week   PT Duration 4 weeks   PT Treatment/Interventions ADLs/Self Care Home Management;Cryotherapy;Electrical Stimulation;Iontophoresis 19m/ml Dexamethasone;Moist Heat;Ultrasound;Traction;Patient/family education;Neuromuscular re-education;Therapeutic activities;Therapeutic exercise;Functional mobility training;Manual techniques   PT Next Visit Plan review HEP; core/spine stabilization and add to HEP   Consulted and Agree with Plan of Care Patient      Patient will benefit from skilled therapeutic intervention in order to improve the following deficits and impairments:  Decreased strength, Postural dysfunction, Pain, Decreased mobility  Visit Diagnosis: Abnormal posture - Plan: PT plan of care cert/re-cert  Midline low back pain with sciatica, sciatica laterality unspecified -  Plan: PT plan of care cert/re-cert  Muscle weakness (generalized) - Plan: PT plan of care cert/re-cert      G-Codes - [id number]/63/811253    Functional Assessment Tool Used FOTO 31% limited; predicted 27% limited   Functional Limitation Changing and maintaining body position   Changing and Maintaining Body Position Current Status ((R7116 At least 20 percent but less than 40 percent impaired, limited or restricted   Changing and Maintaining Body Position Goal Status ((F7903 At least 20 percent but less than 40 percent impaired, limited or restricted       Problem List Patient Active Problem List   Diagnosis Date Noted  . PCP NOTES >>>>>>>>>>>>>>>>>>>>>>>>>>>>>>>>>>>>> 07/03/2015  . Endometrial cancer ([place] 01/27/2015  . Thickened endometrium 01/05/2015  . Postmenopausal bleeding 01/03/2015  . DJD (degenerative joint disease) 05/27/2013  . Cough 05/27/2013  . Palpitations 05/20/2013  . [place]anoma ([place]   . Allergic rhinitis 08/13/2011  . Annual physical exam 05/04/2011  . History of breast cancer, left.  March 2003. 12/29/2010  . Breast cancer ([place] 12/25/2010  . Menopause 12/25/2010  . Post-menopausal atrophic vaginitis 12/25/2010  . Hyperlipidemia 03/14/2010  . HTN (hypertension) 03/14/2010  . GERD 03/14/2010  . OSTEOPENIA 03/14/2010   SLaureen Abrahams PT, DPT 11/30/2015 12:58 PM  [facility] [place] [address]Phone: [phone]  Fax:  [phone] Name: GBRAELEE HERRLEMRN: [id number] of Birth: 722-Aug-1947

## 2015-12-07 ENCOUNTER — Ambulatory Visit: Payer: PPO | Admitting: Physical Therapy

## 2015-12-07 DIAGNOSIS — R293 Abnormal posture: Secondary | ICD-10-CM

## 2015-12-07 DIAGNOSIS — M6281 Muscle weakness (generalized): Secondary | ICD-10-CM

## 2015-12-07 DIAGNOSIS — M5440 Lumbago with sciatica, unspecified side: Secondary | ICD-10-CM

## 2015-12-07 NOTE — Patient Instructions (Signed)
   With yoga strap around thighs:  1. Flatten back and push thighs out into the resistance of the strap.  Hold for 5 sec.  Repeat 10 times.  2. Lift bottom up and push thighs out into the resistance of the strap.  Hold for 5 sec.  Repeat 10 times.   With blue theraband around thighs:  1. Push one leg out to the side and keep the other leg still.  Perform 10 times on each side.

## 2015-12-07 NOTE — Therapy (Signed)
[facility] [address]  [place], [place], [address] Phone: [phone]   Fax:  [phone]  Physical Therapy Treatment  Patient Details  Name: Emily Thompson MRN: [id number] Date of Birth: 06-03-1945 Referring Provider: Dr. Larose Kells  Encounter Date: 12/07/2015      PT End of Session - 12/07/15 1019    Visit Number 2   Number of Visits 6   Date for PT Re-Evaluation 01/11/16   PT Start Time 0928   PT Stop Time 1010   PT Time Calculation (min) 42 min   Activity Tolerance Patient tolerated treatment well   Behavior During Therapy [facility] for tasks assessed/performed      Past Medical History:  Diagnosis Date  . BCC (basal cell carcinoma of skin) 2013, 2016  . Breast cancer ([place]) 2003   chemo, radiation, lumpectomy, reconstructive surgery   . Complication of anesthesia   . Dyslipidemia   . Endometrial cancer ([place])   . GERD (gastroesophageal reflux disease)   . Hypertension   . Melanoma ([place]) 2012   righ side face; basal cell (nose)   . Osteopenia   . PONV (postoperative nausea and vomiting)   . SVT (supraventricular tachycardia) (HCC)    used to see Dr Rex Kras     Past Surgical History:  Procedure Laterality Date  . BASAL CELL CARCINOMA EXCISION    . BREAST LUMPECTOMY Left 07/2001  . BREAST RECONSTRUCTION  2005  . [place]  . COLONOSCOPY W/ POLYPECTOMY  2013   NEXT DUE IN 5 YRS  . NM MYOCAR PERF WALL MOTION  01/2006   bruce myoview; no evidence of inducible ischemia, anterior wall thinning without evidence of ischemia; post-stress EF 88%; low risk scan   . NOSE SURGERY     basal cell carcinoma removed  . ROBOTIC ASSISTED TOTAL HYSTERECTOMY WITH BILATERAL SALPINGO OOPHERECTOMY Bilateral 01/27/2015   Procedure: ROBOTIC ASSISTED TOTAL HYSTERECTOMY WITH BILATERAL SALPINGO OOPHORECTOMY AND SENTINAL NODE BIOPSY;  Surgeon: Everitt Amber, MD;  Location: [facility];  Service: Gynecology;  Laterality:  Bilateral;  . SKIN CANCER EXCISION     melanoma  . TONSILLECTOMY AND ADENOIDECTOMY  1952  . TRANSTHORACIC ECHOCARDIOGRAM  12/2005   EF 50-55%; mild MR; trace TR  . TUBAL LIGATION  1978  . VULVA /PERINEUM BIOPSY     PAPILLOMA    There were no vitals filed for this visit.      Subjective Assessment - 12/07/15 0930    Subjective feels icy hot patch really helps.  stretches going well.   Patient Stated Goals avoid invasive medical procedures/medication; exercises to help stabilize and stretch   Currently in Pain? No/denies   Pain Score 0-No pain                         [facility] Adult PT Treatment/Exercise - 12/07/15 0932      Lumbar Exercises: Stretches   Passive Hamstring Stretch 3 reps;30 seconds   Passive Hamstring Stretch Limitations supine with strap   ITB Stretch 3 reps;30 seconds   ITB Stretch Limitations supine with strap   Piriformis Stretch 3 reps;30 seconds     Lumbar Exercises: Aerobic   Stationary Bike NuStep L 5 x 6 min     Lumbar Exercises: Supine   Clam Limitations alternating with blue theraband x 10   Bridge 10 reps;5 seconds   Bridge Limitations with isometric hip abdct   Other Supine Lumbar Exercises  isometric hip abdct x 10                PT Education - 12/07/15 1019    Education provided Yes   Education Details SI stab exercises   Person(s) Educated Patient   Methods Explanation;Demonstration;Handout   Comprehension Verbalized understanding;Returned demonstration             PT Long Term Goals - 12/07/15 1020      PT LONG TERM GOAL #1   Title independent with HEP (01/11/16)   Status On-going     PT LONG TERM GOAL #2   Title report ability to stand for > 30 min without increase in pain (01/11/16)   Status On-going     PT LONG TERM GOAL #3   Title verbalize understanding of posture/body mechanics to decrease risk of reinjury (01/11/16)   Status On-going               Plan - 12/07/15 1020    Clinical  Impression Statement Pt tolerated exercises and stretches well today; added some stabilization exercises to HEP.  Will continue to benefit from PT to maximize function.   PT Treatment/Interventions ADLs/Self Care Home Management;Cryotherapy;Electrical Stimulation;Iontophoresis 4mg /ml Dexamethasone;Moist Heat;Ultrasound;Traction;Patient/family education;Neuromuscular re-education;Therapeutic activities;Therapeutic exercise;Functional mobility training;Manual techniques   PT Next Visit Plan review HEP; core stabilization and add to HEP   Consulted and Agree with Plan of Care Patient      Patient will benefit from skilled therapeutic intervention in order to improve the following deficits and impairments:  Decreased strength, Postural dysfunction, Pain, Decreased mobility  Visit Diagnosis: Abnormal posture  Midline low back pain with sciatica, sciatica laterality unspecified  Muscle weakness (generalized)     Problem List Patient Active Problem List   Diagnosis Date Noted  . PCP NOTES >>>>>>>>>>>>>>>>>>>>>>>>>>>>>>>>>>>>> 07/03/2015  . Endometrial cancer ([place]) 01/27/2015  . Thickened endometrium 01/05/2015  . Postmenopausal bleeding 01/03/2015  . DJD (degenerative joint disease) 05/27/2013  . Cough 05/27/2013  . Palpitations 05/20/2013  . Melanoma ([place])   . Allergic rhinitis 08/13/2011  . Annual physical exam 05/04/2011  . History of breast cancer, left.  March 2003. 12/29/2010  . Breast cancer ([place]) 12/25/2010  . Menopause 12/25/2010  . Post-menopausal atrophic vaginitis 12/25/2010  . Hyperlipidemia 03/14/2010  . HTN (hypertension) 03/14/2010  . GERD 03/14/2010  . OSTEOPENIA 03/14/2010     Laureen Abrahams, PT, DPT 12/07/15 10:21 AM  [facility] [address]  [place], [place], [address] Phone: [phone]   Fax:  [phone]  Name: Emily Thompson MRN: [id number] Date of Birth: 12-Sep-1945

## 2015-12-12 DIAGNOSIS — Z1231 Encounter for screening mammogram for malignant neoplasm of breast: Secondary | ICD-10-CM | POA: Diagnosis not present

## 2015-12-14 ENCOUNTER — Ambulatory Visit: Payer: PPO | Attending: Internal Medicine | Admitting: Physical Therapy

## 2015-12-14 DIAGNOSIS — M5440 Lumbago with sciatica, unspecified side: Secondary | ICD-10-CM

## 2015-12-14 DIAGNOSIS — M6281 Muscle weakness (generalized): Secondary | ICD-10-CM | POA: Insufficient documentation

## 2015-12-14 DIAGNOSIS — R293 Abnormal posture: Secondary | ICD-10-CM | POA: Insufficient documentation

## 2015-12-14 NOTE — Therapy (Signed)
[facility] [address]  [place], [place], [address] Phone: [phone]   Fax:  [phone]  Physical Therapy Treatment  Patient Details  Name: Emily Thompson MRN: [id number] Date of Birth: [birth year] Referring Provider: Dr. Larose Kells  Encounter Date: 12/14/2015      PT End of Session - 12/14/15 1112    Visit Number 3   Number of Visits 6   Date for PT Re-Evaluation 01/11/16   PT Start Time 1030   PT Stop Time 1109   PT Time Calculation (min) 39 min   Activity Tolerance Patient tolerated treatment well   Behavior During Therapy [facility] for tasks assessed/performed      Past Medical History:  Diagnosis Date  . BCC (basal cell carcinoma of skin) 2013, 2016  . Breast cancer ([place]) 2003   chemo, radiation, lumpectomy, reconstructive surgery   . Complication of anesthesia   . Dyslipidemia   . Endometrial cancer ([place])   . GERD (gastroesophageal reflux disease)   . Hypertension   . Melanoma ([place]) 2012   righ side face; basal cell (nose)   . Osteopenia   . PONV (postoperative nausea and vomiting)   . SVT (supraventricular tachycardia) (HCC)    used to see Dr Rex Kras     Past Surgical History:  Procedure Laterality Date  . BASAL CELL CARCINOMA EXCISION    . BREAST LUMPECTOMY Left 07/2001  . BREAST RECONSTRUCTION  2005  . [place]  . COLONOSCOPY W/ POLYPECTOMY  2013   NEXT DUE IN 5 YRS  . NM MYOCAR PERF WALL MOTION  01/2006   bruce myoview; no evidence of inducible ischemia, anterior wall thinning without evidence of ischemia; post-stress EF 88%; low risk scan   . NOSE SURGERY     basal cell carcinoma removed  . ROBOTIC ASSISTED TOTAL HYSTERECTOMY WITH BILATERAL SALPINGO OOPHERECTOMY Bilateral 01/27/2015   Procedure: ROBOTIC ASSISTED TOTAL HYSTERECTOMY WITH BILATERAL SALPINGO OOPHORECTOMY AND SENTINAL NODE BIOPSY;  Surgeon: Everitt Amber, MD;  Location: [facility];  Service: Gynecology;  Laterality:  Bilateral;  . SKIN CANCER EXCISION     melanoma  . TONSILLECTOMY AND ADENOIDECTOMY  1952  . TRANSTHORACIC ECHOCARDIOGRAM  12/2005   EF 50-55%; mild MR; trace TR  . TUBAL LIGATION  1978  . VULVA /PERINEUM BIOPSY     PAPILLOMA    There were no vitals filed for this visit.      Subjective Assessment - 12/14/15 1031    Subjective just had mammogram and everything is clear (hx of breast cx); back is feeling better - having some days where it aggravates it   Limitations Sitting;Standing;Walking   Currently in Pain? No/denies                         [facility] Adult PT Treatment/Exercise - 12/14/15 1035      Lumbar Exercises: Aerobic   Stationary Bike NuStep L 7 x 6 min     Lumbar Exercises: Supine   Ab Set 10 reps;5 seconds   Clam 10 reps  with ab set   Clam Limitations alternating with blue theraband x 10   Heel Slides 10 reps   Heel Slides Limitations with ab set   Bent Knee Raise 10 reps   Bent Knee Raise Limitations with ab set   Bridge 10 reps;5 seconds   Bridge Limitations with isometric hip abdct   Other Supine Lumbar Exercises isometric hip abdct x  10   Other Supine Lumbar Exercises bridge with clam blue theraband x 10     Knee/Hip Exercises: Stretches   Other Knee/Hip Stretches instructed in seated and standing alternative piriformis and IT band stretches as pt reports it seemed to be affecting her reflux                PT Education - 12/14/15 1112    Education provided Yes   Education Details piriformis and IT band stretch alternatives; core stab HEP   Person(s) Educated Patient   Methods Explanation;Demonstration;Handout   Comprehension Verbalized understanding;Returned demonstration             PT Long Term Goals - 12/07/15 1020      PT LONG TERM GOAL #1   Title independent with HEP (01/11/16)   Status On-going     PT LONG TERM GOAL #2   Title report ability to stand for > 30 min without increase in pain (01/11/16)   Status  On-going     PT LONG TERM GOAL #3   Title verbalize understanding of posture/body mechanics to decrease risk of reinjury (01/11/16)   Status On-going               Plan - 12/14/15 1113    Clinical Impression Statement Pt reports pain is decreasing and feels she is able to increase activity.  Continues to have some pain at end of day with increased activity.  Will continue to benefit from PT to maximize function.   PT Treatment/Interventions ADLs/Self Care Home Management;Cryotherapy;Electrical Stimulation;Iontophoresis 4mg /ml Dexamethasone;Moist Heat;Ultrasound;Traction;Patient/family education;Neuromuscular re-education;Therapeutic activities;Therapeutic exercise;Functional mobility training;Manual techniques   PT Next Visit Plan review HEP; physioball activities; standing hip exercises   Consulted and Agree with Plan of Care Patient      Patient will benefit from skilled therapeutic intervention in order to improve the following deficits and impairments:  Decreased strength, Postural dysfunction, Pain, Decreased mobility  Visit Diagnosis: Abnormal posture  Midline low back pain with sciatica, sciatica laterality unspecified  Muscle weakness (generalized)     Problem List Patient Active Problem List   Diagnosis Date Noted  . PCP NOTES >>>>>>>>>>>>>>>>>>>>>>>>>>>>>>>>>>>>> 07/03/2015  . Endometrial cancer ([place]) 01/27/2015  . Thickened endometrium 01/05/2015  . Postmenopausal bleeding 01/03/2015  . DJD (degenerative joint disease) 05/27/2013  . Cough 05/27/2013  . Palpitations 05/20/2013  . Melanoma ([place])   . Allergic rhinitis 08/13/2011  . Annual physical exam 05/04/2011  . History of breast cancer, left.  March 2003. 12/29/2010  . Breast cancer ([place]) 12/25/2010  . Menopause 12/25/2010  . Post-menopausal atrophic vaginitis 12/25/2010  . Hyperlipidemia 03/14/2010  . HTN (hypertension) 03/14/2010  . GERD 03/14/2010  . OSTEOPENIA 03/14/2010      Laureen Abrahams, PT, DPT 12/14/15 11:15 AM    [facility] [place], [place], [address] Phone: [phone]   Fax:  [phone]  Name: Emily Thompson MRN: [id number] Date of Birth: 1945-07-29

## 2015-12-14 NOTE — Patient Instructions (Signed)
Piriformis Stretch, Sitting    Sit, one ankle on opposite knee, same-side hand on crossed knee. Push down on knee, keeping spine straight. Lean torso forward, with flat back, until tension is felt in hamstrings and gluteals of crossed-leg side. Hold _20-30__ seconds.  Repeat _2-3_ times per session. Do __2-3_ sessions per day.  Copyright  VHI. All rights reserved.    IT Band: Wall Lean With Crossed Leg    Stand with left hand on wall. Cross right leg behind other leg. Stretch hip toward wall with other arm supporting trunk. Hold __20-30_ seconds. Relax. Repeat _2-3__ times. Do _2-3__ times a day. Repeat on other side.  Copyright  VHI. All rights reserved.     IT Band: Knee Across Body    Lie on back, shoulders on surface. Stretch right knee up and across body. Hold __20-30_ seconds. Relax. Repeat __2-3_ times. Do _2-3__ times a day. Repeat with other leg.    Copyright  VHI. All rights reserved.     PELVIC TILT    Lie with hips and knees bent. Slowly inhale, and then exhale. Pull navel toward spine and tighten pelvic floor. Hold for __10_ seconds. Continue to breathe in and out during hold. Rest for _10__ seconds. Repeat __10_ times. Do __2-3_ times a day.   Copyright  VHI. All rights reserved.  Knee Fold   Lie on back, legs bent, arms by sides. Exhale, lifting knee to chest. Inhale, returning. Keep abdominals flat, navel to spine. Repeat __10__ times, alternating legs. Do __2__ sessions per day.  Copyright  VHI. All rights reserved.    Knee Drop   Keep pelvis stable. Without rotating hips, slowly drop knee to side, pause, return to center, bring knee across midline toward opposite hip. Feel obliques engaging. Repeat for ___10_ times each leg.  Copyright  VHI. All rights reserved.    Heel Slide to Straight   Slide one leg down to straight. Return. Be sure pelvis does not rock forward, tilt, rotate, or tip to side. Do _10__ times. Restabilize pelvis.  Repeat with other leg. Do __1-2_ sets, __2_ times per day.  http://ss.exer.us/16   Copyright  VHI. All rights reserved.

## 2015-12-20 ENCOUNTER — Ambulatory Visit: Payer: PPO | Admitting: Physical Therapy

## 2015-12-20 DIAGNOSIS — R293 Abnormal posture: Secondary | ICD-10-CM

## 2015-12-20 DIAGNOSIS — M5440 Lumbago with sciatica, unspecified side: Secondary | ICD-10-CM

## 2015-12-20 DIAGNOSIS — M6281 Muscle weakness (generalized): Secondary | ICD-10-CM

## 2015-12-20 NOTE — Therapy (Signed)
[facility] [address]  [place], [place], [address] Phone: [phone]   Fax:  [phone]  Physical Therapy Treatment  Patient Details  Name: Emily Thompson MRN: [id number] Date of Birth: 02-09-1946 Referring Provider: Dr. Larose Kells  Encounter Date: 12/20/2015      PT End of Session - 12/20/15 1154    Visit Number 4   Number of Visits 6   Date for PT Re-Evaluation 01/11/16   PT Start Time 0930   PT Stop Time 1013   PT Time Calculation (min) 43 min   Activity Tolerance Patient tolerated treatment well   Behavior During Therapy [facility] for tasks assessed/performed      Past Medical History:  Diagnosis Date  . BCC (basal cell carcinoma of skin) 2013, 2016  . Breast cancer ([place]) 2003   chemo, radiation, lumpectomy, reconstructive surgery   . Complication of anesthesia   . Dyslipidemia   . Endometrial cancer ([place])   . GERD (gastroesophageal reflux disease)   . Hypertension   . Melanoma ([place]) 2012   righ side face; basal cell (nose)   . Osteopenia   . PONV (postoperative nausea and vomiting)   . SVT (supraventricular tachycardia) (HCC)    used to see Dr Rex Kras     Past Surgical History:  Procedure Laterality Date  . BASAL CELL CARCINOMA EXCISION    . BREAST LUMPECTOMY Left 07/2001  . BREAST RECONSTRUCTION  2005  . [place]  . COLONOSCOPY W/ POLYPECTOMY  2013   NEXT DUE IN 5 YRS  . NM MYOCAR PERF WALL MOTION  01/2006   bruce myoview; no evidence of inducible ischemia, anterior wall thinning without evidence of ischemia; post-stress EF 88%; low risk scan   . NOSE SURGERY     basal cell carcinoma removed  . ROBOTIC ASSISTED TOTAL HYSTERECTOMY WITH BILATERAL SALPINGO OOPHERECTOMY Bilateral 01/27/2015   Procedure: ROBOTIC ASSISTED TOTAL HYSTERECTOMY WITH BILATERAL SALPINGO OOPHORECTOMY AND SENTINAL NODE BIOPSY;  Surgeon: Everitt Amber, MD;  Location: [facility];  Service: Gynecology;  Laterality:  Bilateral;  . SKIN CANCER EXCISION     melanoma  . TONSILLECTOMY AND ADENOIDECTOMY  1952  . TRANSTHORACIC ECHOCARDIOGRAM  12/2005   EF 50-55%; mild MR; trace TR  . TUBAL LIGATION  1978  . VULVA /PERINEUM BIOPSY     PAPILLOMA    There were no vitals filed for this visit.      Subjective Assessment - 12/20/15 0933    Subjective had a little set back last week; thinks she may have overdone it on the sitting stretches   Limitations Sitting;Standing;Walking   Patient Stated Goals avoid invasive medical procedures/medication; exercises to help stabilize and stretch   Currently in Pain? No/denies                         [facility] Adult PT Treatment/Exercise - 12/20/15 0934      Lumbar Exercises: Aerobic   Stationary Bike NuStep L 7 x 6 min     Lumbar Exercises: Seated   Long Arc Quad on [place] Both;15 reps;Weights   LAQ on [employer] (lbs) 3   Hip Flexion on Ball Both;15 reps   Hip Flexion on Ball Limitations 3#     Lumbar Exercises: Supine   Bridge Limitations x10 with green pball and hamstring curls   Other Supine Lumbar Exercises seated on physioball (green): pelvic tilt ant/post and laterally; scap retraction blue tband x  15     Knee/Hip Exercises: Stretches   Hip Flexor Stretch Both;2 reps;30 seconds   Hip Flexor Stretch Limitations standing     Knee/Hip Exercises: Standing   Hip Flexion Both;15 reps;Knee straight   Hip Flexion Limitations 3# with ab set   Hip Abduction Both;15 reps;Knee straight   Abduction Limitations 3# with ab set   Hip Extension Both;15 reps;Knee straight   Extension Limitations 3# with ab set                     PT Long Term Goals - 12/07/15 1020      PT LONG TERM GOAL #1   Title independent with HEP (01/11/16)   Status On-going     PT LONG TERM GOAL #2   Title report ability to stand for > 30 min without increase in pain (01/11/16)   Status On-going     PT LONG TERM GOAL #3   Title verbalize understanding of  posture/body mechanics to decrease risk of reinjury (01/11/16)   Status On-going               Plan - 12/20/15 1155    Clinical Impression Statement Pt tolerated standing core and seated physioball exercises well today.  Progressing well with PT and pain decreasing.  Has some hip flexor tightness so instructed in standing stretch.     PT Treatment/Interventions ADLs/Self Care Home Management;Cryotherapy;Electrical Stimulation;Iontophoresis 4mg /ml Dexamethasone;Moist Heat;Ultrasound;Traction;Patient/family education;Neuromuscular re-education;Therapeutic activities;Therapeutic exercise;Functional mobility training;Manual techniques   PT Next Visit Plan review HEP; physioball activities; standing hip exercises   Consulted and Agree with Plan of Care Patient      Patient will benefit from skilled therapeutic intervention in order to improve the following deficits and impairments:  Decreased strength, Postural dysfunction, Pain, Decreased mobility  Visit Diagnosis: Abnormal posture  Midline low back pain with sciatica, sciatica laterality unspecified  Muscle weakness (generalized)     Problem List Patient Active Problem List   Diagnosis Date Noted  . PCP NOTES >>>>>>>>>>>>>>>>>>>>>>>>>>>>>>>>>>>>> 07/03/2015  . Endometrial cancer ([place]) 01/27/2015  . Thickened endometrium 01/05/2015  . Postmenopausal bleeding 01/03/2015  . DJD (degenerative joint disease) 05/27/2013  . Cough 05/27/2013  . Palpitations 05/20/2013  . Melanoma ([place])   . Allergic rhinitis 08/13/2011  . Annual physical exam 05/04/2011  . History of breast cancer, left.  March 2003. 12/29/2010  . Breast cancer ([place]) 12/25/2010  . Menopause 12/25/2010  . Post-menopausal atrophic vaginitis 12/25/2010  . Hyperlipidemia 03/14/2010  . HTN (hypertension) 03/14/2010  . GERD 03/14/2010  . OSTEOPENIA 03/14/2010        Laureen Abrahams, PT, DPT 12/20/15 11:56 AM      [facility] [address]  [place], [place], [address] Phone: [phone]   Fax:  [phone]  Name: ANTOINNETTE HOLLOMON MRN: [id number] Date of Birth: [birth year]

## 2015-12-21 ENCOUNTER — Ambulatory Visit: Payer: PPO

## 2015-12-26 ENCOUNTER — Other Ambulatory Visit: Payer: Self-pay | Admitting: Internal Medicine

## 2015-12-28 ENCOUNTER — Encounter: Payer: Self-pay | Admitting: Women's Health

## 2015-12-28 ENCOUNTER — Ambulatory Visit: Payer: PPO | Admitting: Physical Therapy

## 2015-12-28 DIAGNOSIS — R293 Abnormal posture: Secondary | ICD-10-CM | POA: Diagnosis not present

## 2015-12-28 DIAGNOSIS — M6281 Muscle weakness (generalized): Secondary | ICD-10-CM

## 2015-12-28 DIAGNOSIS — M5440 Lumbago with sciatica, unspecified side: Secondary | ICD-10-CM

## 2015-12-28 NOTE — Patient Instructions (Addendum)
Sleeping on Back  Place pillow under knees. A pillow with cervical support and a roll around waist are also helpful. Copyright  VHI. All rights reserved.  Sleeping on Side Place pillow between knees. Use cervical support under neck and a roll around waist as needed. Copyright  VHI. All rights reserved.   Sleeping on Stomach   If this is the only desirable sleeping position, place pillow under lower legs, and under stomach or chest as needed.  Posture - Sitting   Sit upright, head facing forward. Try using a roll to support lower back. Keep shoulders relaxed, and avoid rounded back. Keep hips level with knees. Avoid crossing legs for long periods. Stand to Sit / Sit to Stand   To sit: Bend knees to lower self onto front edge of chair, then scoot back on seat. To stand: Reverse sequence by placing one foot forward, and scoot to front of seat. Use rocking motion to stand up.   Work Height and Reach  Ideal work height is no more than 2 to 4 inches below elbow level when standing, and at elbow level when sitting. Reaching should be limited to arm's length, with elbows slightly bent.  Bending  Bend at hips and knees, not back. Keep feet shoulder-width apart.    Posture - Standing   Good posture is important. Avoid slouching and forward head thrust. Maintain curve in low back and align ears over shoul- ders, hips over ankles.  Alternating Positions   Alternate tasks and change positions frequently to reduce fatigue and muscle tension. Take rest breaks. Computer Work   Position work to face forward. Use proper work and seat height. Keep shoulders back and down, wrists straight, and elbows at right angles. Use chair that provides full back support. Add footrest and lumbar roll as needed.  Getting Into / Out of Car  Lower self onto seat, scoot back, then bring in one leg at a time. Reverse sequence to get out.  Dressing  Lie on back to pull socks or slacks over feet, or sit  and bend leg while keeping back straight.    Housework - Sink  Place one foot on ledge of cabinet under sink when standing at sink for prolonged periods.   Pushing / Pulling  Pushing is preferable to pulling. Keep back in proper alignment, and use leg muscles to do the work.  Deep Squat   Squat and lift with both arms held against upper trunk. Tighten stomach muscles without holding breath. Use smooth movements to avoid jerking.  Avoid Twisting   Avoid twisting or bending back. Pivot around using foot movements, and bend at knees if needed when reaching for articles.  Carrying Luggage   Distribute weight evenly on both sides. Use a cart whenever possible. Do not twist trunk. Move body as a unit.   Lifting Principles .Maintain proper posture and head alignment. .Slide object as close as possible before lifting. .Move obstacles out of the way. .Test before lifting; ask for help if too heavy. .Tighten stomach muscles without holding breath. .Use smooth movements; do not jerk. .Use legs to do the work, and pivot with feet. .Distribute the work load symmetrically and close to the center of trunk. .Push instead of pull whenever possible.   Ask For Help   Ask for help and delegate to others when possible. Coordinate your movements when lifting together, and maintain the low back curve.  Log Roll   Lying on back, bend left knee and place left   arm across chest. Roll all in one movement to the right. Reverse to roll to the left. Always move as one unit. Housework - Sweeping  Use long-handled equipment to avoid stooping.   Housework - Wiping  Position yourself as close as possible to reach work surface. Avoid straining your back.  Laundry - Unloading Wash   To unload small items at bottom of washer, lift leg opposite to arm being used to reach.  [place] close to area to be raked. Use arm movements to do the work. Keep back straight and avoid  twisting.     Cart  When reaching into cart with one arm, lift opposite leg to keep back straight.   Getting Into / Out of Bed  Lower self to lie down on one side by raising legs and lowering head at the same time. Use arms to assist moving without twisting. Bend both knees to roll onto back if desired. To sit up, start from lying on side, and use same move-ments in reverse. Housework - Vacuuming  Hold the vacuum with arm held at side. Step back and forth to move it, keeping head up. Avoid twisting.   Laundry - IT consultant so that bending and twisting can be avoided.   Laundry - Unloading Dryer  Squat down to reach into clothes dryer or use a reacher.  Gardening - Weeding / Probation officer or Kneel. Knee pads may be helpful.                        Bracing With Arm / Leg Raise (Quadruped)   On hands and knees find neutral spine. Tighten pelvic floor and abdominals and hold. Alternating, lift arm to shoulder level and opposite leg to hip level. Repeat __10_ times. Do _2-3__ times a day.   Copyright  VHI. All rights reserved.    With one foot on BOSU:  1. Alternate arms up and down (can add weight to arms)  2. Perform trunk rotation with weighted ball  With both feet on BOSU or foam pad:  1. Squats (can hold onto weighted ball)  2. Trunk Rotation (can hold weighted ball)

## 2015-12-28 NOTE — Therapy (Signed)
[address]  [place], [place], [address] Phone: [phone]   Fax:  [phone]  Physical Therapy Treatment  Patient Details  Name: Emily Thompson MRN: [id number] Date of Birth: 1945-10-17 Referring Provider: Dr. Larose Kells  Encounter Date: 12/28/2015      PT End of Session - 12/28/15 1235    Visit Number 5   Number of Visits 6   Date for PT Re-Evaluation 01/11/16   PT Start Time 0930   PT Stop Time 1008   PT Time Calculation (min) 38 min   Activity Tolerance Patient tolerated treatment well   Behavior During Therapy [facility] for tasks assessed/performed      Past Medical History:  Diagnosis Date  . BCC (basal cell carcinoma of skin) 2013, 2016  . Breast cancer ([place]) 2003   chemo, radiation, lumpectomy, reconstructive surgery   . Complication of anesthesia   . Dyslipidemia   . Endometrial cancer ([place])   . GERD (gastroesophageal reflux disease)   . Hypertension   . Melanoma ([place]) 2012   righ side face; basal cell (nose)   . Osteopenia   . PONV (postoperative nausea and vomiting)   . SVT (supraventricular tachycardia) (HCC)    used to see Dr Rex Kras     Past Surgical History:  Procedure Laterality Date  . BASAL CELL CARCINOMA EXCISION    . BREAST LUMPECTOMY Left 07/2001  . BREAST RECONSTRUCTION  2005  . [place]  . COLONOSCOPY W/ POLYPECTOMY  2013   NEXT DUE IN 5 YRS  . NM MYOCAR PERF WALL MOTION  01/2006   bruce myoview; no evidence of inducible ischemia, anterior wall thinning without evidence of ischemia; post-stress EF 88%; low risk scan   . NOSE SURGERY     basal cell carcinoma removed  . ROBOTIC ASSISTED TOTAL HYSTERECTOMY WITH BILATERAL SALPINGO OOPHERECTOMY Bilateral 01/27/2015   Procedure: ROBOTIC ASSISTED TOTAL HYSTERECTOMY WITH BILATERAL SALPINGO OOPHORECTOMY AND SENTINAL NODE BIOPSY;  Surgeon: Everitt Amber, MD;  Location: [facility];  Service: Gynecology;  Laterality:  Bilateral;  . SKIN CANCER EXCISION     melanoma  . TONSILLECTOMY AND ADENOIDECTOMY  1952  . TRANSTHORACIC ECHOCARDIOGRAM  12/2005   EF 50-55%; mild MR; trace TR  . TUBAL LIGATION  1978  . VULVA /PERINEUM BIOPSY     PAPILLOMA    There were no vitals filed for this visit.      Subjective Assessment - 12/28/15 0929    Subjective did well; flying on plane was okay without much trouble.  standing at a ceremony was what increased pain.  recovered faster.  icy hot patches seem to really help.   Limitations Sitting;Standing;Walking   How long can you sit comfortably? couple hours   How long can you stand comfortably? couple hours   How long can you walk comfortably? no trouble   Pain Score 0-No pain                         [facility] Adult PT Treatment/Exercise - 12/28/15 0934      Self-Care   Self-Care Other Self-Care Comments   Other Self-Care Comments  educated on posture and body mechanics     Lumbar Exercises: Aerobic   Stationary Bike NuStep L 5 x 8 min     Lumbar Exercises: Standing   Other Standing Lumbar Exercises staggered stance with one LE on BOSU: alt UE flexion, trunk rotation (instructed in  squats and trunk rotation standing on BOSU but did not perform)     Lumbar Exercises: Supine   Clam 10 reps   Clam Limitations alternating with black theraband x 10   Bridge Limitations x10 with isometric hip abdct x10 feet on mat; x 10 feet on ball   Other Supine Lumbar Exercises isometric hip abdct x 10   Other Supine Lumbar Exercises alt UE/LE with ab set x 10 bil     Lumbar Exercises: Quadruped   Opposite Arm/Leg Raise Right arm/Left leg;Left arm/Right leg;10 reps                     PT Long Term Goals - 12/28/15 1235      PT LONG TERM GOAL #1   Title independent with HEP (01/11/16)   Status On-going     PT LONG TERM GOAL #2   Title report ability to stand for > 30 min without increase in pain (01/11/16)   Status Achieved     PT LONG TERM  GOAL #3   Title verbalize understanding of posture/body mechanics to decrease risk of reinjury (01/11/16)   Status Achieved               Plan - 12/28/15 1236    Clinical Impression Statement Pt is independent with all current home exercises, added new exercises today.  Pt feels pain significantly improved and feels she has a better understanding of how to prevent and manage pain at this time.  Plan to follow up in 2 weeks and allow pt to exercise on her own.  Anticipate d/c next visit.   PT Treatment/Interventions ADLs/Self Care Home Management;Cryotherapy;Electrical Stimulation;Iontophoresis 4mg /ml Dexamethasone;Moist Heat;Ultrasound;Traction;Patient/family education;Neuromuscular re-education;Therapeutic activities;Therapeutic exercise;Functional mobility training;Manual techniques   PT Next Visit Plan plan for d/c; address any final concerns   Consulted and Agree with Plan of Care Patient      Patient will benefit from skilled therapeutic intervention in order to improve the following deficits and impairments:  Decreased strength, Postural dysfunction, Pain, Decreased mobility  Visit Diagnosis: Abnormal posture  Midline low back pain with sciatica, sciatica laterality unspecified  Muscle weakness (generalized)     Problem List Patient Active Problem List   Diagnosis Date Noted  . PCP NOTES >>>>>>>>>>>>>>>>>>>>>>>>>>>>>>>>>>>>> 07/03/2015  . Endometrial cancer ([place]) 01/27/2015  . Thickened endometrium 01/05/2015  . Postmenopausal bleeding 01/03/2015  . DJD (degenerative joint disease) 05/27/2013  . Cough 05/27/2013  . Palpitations 05/20/2013  . Melanoma ([place])   . Allergic rhinitis 08/13/2011  . Annual physical exam 05/04/2011  . History of breast cancer, left.  March 2003. 12/29/2010  . Breast cancer ([place]) 12/25/2010  . Menopause 12/25/2010  . Post-menopausal atrophic vaginitis 12/25/2010  . Hyperlipidemia 03/14/2010  . HTN (hypertension) 03/14/2010  . GERD  03/14/2010  . OSTEOPENIA 03/14/2010       Laureen Thompson, PT, DPT 12/28/15 12:42 PM    [facility] [address]  [place], [place], [address] Phone: [phone]   Fax:  [phone]  Name: Emily Thompson MRN: [id number] Date of Birth: 18-Jun-1945

## 2016-01-11 ENCOUNTER — Ambulatory Visit: Payer: PPO | Admitting: Physical Therapy

## 2016-01-11 DIAGNOSIS — M6281 Muscle weakness (generalized): Secondary | ICD-10-CM

## 2016-01-11 DIAGNOSIS — R293 Abnormal posture: Secondary | ICD-10-CM | POA: Diagnosis not present

## 2016-01-11 DIAGNOSIS — M5440 Lumbago with sciatica, unspecified side: Secondary | ICD-10-CM

## 2016-01-11 NOTE — Therapy (Signed)
[facility] [address]  [place], [place], [address] Phone: [phone]   Fax:  [phone]  Physical Therapy Treatment  Patient Details  Name: Emily Thompson MRN: [id number] Date of Birth: [birth year] Referring Provider: Dr. Larose Kells  Encounter Date: 01/11/2016      PT End of Session - 01/11/16 1021    Visit Number 6   PT Start Time 0930   PT Stop Time 1011   PT Time Calculation (min) 41 min   Activity Tolerance Patient tolerated treatment well   Behavior During Therapy [facility] for tasks assessed/performed      Past Medical History:  Diagnosis Date  . BCC (basal cell carcinoma of skin) 2013, 2016  . Breast cancer ([place]) 2003   chemo, radiation, lumpectomy, reconstructive surgery   . Complication of anesthesia   . Dyslipidemia   . Endometrial cancer ([place])   . GERD (gastroesophageal reflux disease)   . Hypertension   . Melanoma ([place]) 2012   righ side face; basal cell (nose)   . Osteopenia   . PONV (postoperative nausea and vomiting)   . SVT (supraventricular tachycardia) (HCC)    used to see Dr Rex Kras     Past Surgical History:  Procedure Laterality Date  . BASAL CELL CARCINOMA EXCISION    . BREAST LUMPECTOMY Left 07/2001  . BREAST RECONSTRUCTION  2005  . [place]  . COLONOSCOPY W/ POLYPECTOMY  2013   NEXT DUE IN 5 YRS  . NM MYOCAR PERF WALL MOTION  01/2006   bruce myoview; no evidence of inducible ischemia, anterior wall thinning without evidence of ischemia; post-stress EF 88%; low risk scan   . NOSE SURGERY     basal cell carcinoma removed  . ROBOTIC ASSISTED TOTAL HYSTERECTOMY WITH BILATERAL SALPINGO OOPHERECTOMY Bilateral 01/27/2015   Procedure: ROBOTIC ASSISTED TOTAL HYSTERECTOMY WITH BILATERAL SALPINGO OOPHORECTOMY AND SENTINAL NODE BIOPSY;  Surgeon: Everitt Amber, MD;  Location: [facility];  Service: Gynecology;  Laterality: Bilateral;  . SKIN CANCER EXCISION     melanoma  . TONSILLECTOMY  AND ADENOIDECTOMY  1952  . TRANSTHORACIC ECHOCARDIOGRAM  12/2005   EF 50-55%; mild MR; trace TR  . TUBAL LIGATION  1978  . VULVA /PERINEUM BIOPSY     PAPILLOMA    There were no vitals filed for this visit.      Subjective Assessment - 01/11/16 0932    Subjective doing well; has occasional flare ups but overall she's doing well.   Limitations Sitting;Standing;Walking   How long can you sit comfortably? couple hours   How long can you stand comfortably? couple hours   How long can you walk comfortably? no trouble   Patient Stated Goals avoid invasive medical procedures/medication; exercises to help stabilize and stretch   Currently in Pain? No/denies            [facility] PT Assessment - 01/11/16 1019      Observation/Other Assessments   Focus on Therapeutic Outcomes (FOTO)  82 (18% limited)            Vestibular Assessment - 01/11/16 1012      Positional Testing   Dix-Hallpike Dix-Hallpike Left   Sidelying Test Sidelying Right;Sidelying Left   Horizontal Canal Testing Horizontal Canal Right;Horizontal Canal Left     Dix-Hallpike Left   Dix-Hallpike Left Duration none   Dix-Hallpike Left Symptoms No nystagmus     Sidelying Right   Sidelying Right Duration none   Sidelying Right Symptoms  No nystagmus     Sidelying Left   Sidelying Left Duration none   Sidelying Left Symptoms No nystagmus     Horizontal Canal Right   Horizontal Canal Right Duration none   Horizontal Canal Right Symptoms Normal     Horizontal Canal Left   Horizontal Canal Left Duration increasing symptoms x 2 min in position   Horizontal Canal Left Symptoms Normal                 [facility] Adult PT Treatment/Exercise - 2016-01-28 0934      Self-Care   Other Self-Care Comments  discussed normal response to exercise including fatigue and muscle soreness; pt verbalized understanding     Lumbar Exercises: Stretches   ITB Stretch 1 rep;30 seconds   ITB Stretch Limitations supine with strap;  with cues for technique     Lumbar Exercises: Aerobic   Stationary Bike NuStep L 6 x 8 min         Vestibular Treatment/Exercise - Jan 28, 2016 1013      Vestibular Treatment/Exercise   Vestibular Treatment Provided Habituation;Gaze   Habituation Exercises Horizontal Roll   Gaze Exercises X1 Viewing Horizontal;X1 Viewing Vertical     Horizontal Roll   Number of Reps  2   Symptom Description  for HEP instruction     X1 Viewing Horizontal   Foot Position seated   Time --  30 sec   Reps 1   Comments mild increase in symptoms with quick resolve     X1 Viewing Vertical   Foot Position seated   Time --  30 sec   Reps 1   Comments reports symptoms increased more with vertical               PT Education - 28-Jan-2016 1020    Education provided Yes   Education Details gaze stabilization, hatibutation   Person(s) Educated Patient   Methods Explanation;Demonstration;Handout   Comprehension Verbalized understanding;Returned demonstration             PT Long Term Goals - 01-28-2016 1021      PT LONG TERM GOAL #1   Title independent with HEP (01-28-16)   Status Achieved     PT LONG TERM GOAL #2   Title report ability to stand for > 30 min without increase in pain (January 28, 2016)   Status Achieved     PT LONG TERM GOAL #3   Title verbalize understanding of posture/body mechanics to decrease risk of reinjury (January 28, 2016)   Status Achieved               Plan - January 28, 2016 1021    Clinical Impression Statement Pt has met all goals and is ready for d/c.  Added some vestibular exercises to HEP today to help with symptoms of vertigo which pt reports multi year hx.  Has exercise plan and program in place including HEP and pilates class.   Rehab Potential Good   PT Treatment/Interventions ADLs/Self Care Home Management;Cryotherapy;Electrical Stimulation;Iontophoresis 109m/ml Dexamethasone;Moist Heat;Ultrasound;Traction;Patient/family education;Neuromuscular  re-education;Therapeutic activities;Therapeutic exercise;Functional mobility training;Manual techniques   PT Next Visit Plan d/c PT today   Consulted and Agree with Plan of Care Patient      Patient will benefit from skilled therapeutic intervention in order to improve the following deficits and impairments:  Decreased strength, Postural dysfunction, Pain, Decreased mobility, Dizziness  Visit Diagnosis: Midline low back pain with sciatica, sciatica laterality unspecified  Muscle weakness (generalized)  Abnormal posture       G-Codes - 02017-09-16  1022    Functional Assessment Tool Used FOTO 18% limited   Functional Limitation Changing and maintaining body position   Changing and Maintaining Body Position Goal Status (U7276) At least 20 percent but less than 40 percent impaired, limited or restricted   Changing and Maintaining Body Position Discharge Status (B8485) At least 1 percent but less than 20 percent impaired, limited or restricted      Problem List Patient Active Problem List   Diagnosis Date Noted  . PCP NOTES >>>>>>>>>>>>>>>>>>>>>>>>>>>>>>>>>>>>> 07/03/2015  . Endometrial cancer ([place]) 01/27/2015  . Thickened endometrium 01/05/2015  . Postmenopausal bleeding 01/03/2015  . DJD (degenerative joint disease) 05/27/2013  . Cough 05/27/2013  . Palpitations 05/20/2013  . Melanoma ([place])   . Allergic rhinitis 08/13/2011  . Annual physical exam 05/04/2011  . History of breast cancer, left.  March 2003. 12/29/2010  . Breast cancer ([place]) 12/25/2010  . Menopause 12/25/2010  . Post-menopausal atrophic vaginitis 12/25/2010  . Hyperlipidemia 03/14/2010  . HTN (hypertension) 03/14/2010  . GERD 03/14/2010  . OSTEOPENIA 03/14/2010       Laureen Abrahams, PT, DPT 01/11/16 10:23 AM    [facility] [place], [place], [address] Phone: [phone]   Fax:  [phone]  Name: Emily Thompson MRN: [id number] Date of Birth: 06/24/1945     PHYSICAL THERAPY DISCHARGE SUMMARY  Visits from Start of Care: 6  Current functional level related to goals / functional outcomes: See above   Remaining deficits: N/a; pt reports occasional episodes of pain but states she knows how to manage pain   Education / Equipment: HEP, posture/body mechanics  Plan: Patient agrees to discharge.  Patient goals were met. Patient is being discharged due to meeting the stated rehab goals.  ?????      Laureen Abrahams, PT, DPT 01/11/16 10:24 AM  [facility] [place], [place] [address]  [phone] (office) [phone] (fax)

## 2016-01-11 NOTE — Patient Instructions (Signed)
Rolling    With pillow under head, start on back. Roll quickly to left. Hold position until symptoms increase to a 3/10. Roll quickly onto back and hold position until symptoms subside. Repeat sequence _3-5___ times per session. Do __1-3__ sessions per day.  You may stop this exercise when you can lie on you left side without symptoms.  You should find that you are able to lie on the left for longer periods of time.  Copyright  VHI. All rights reserved.   Gaze Stabilization: Sitting    Keeping eyes on target on wall 3-5 feet away, tilt head down 15-30 and move head side to side for __30__ seconds. Repeat while moving head up and down for __30__ seconds. Do __1-3__ sessions per day.  Copyright  VHI. All rights reserved.    Gaze Stabilization: Tip Card  1.Target must remain in focus, not blurry, and appear stationary while head is in motion. 2.Perform exercises with small head movements (45 to either side of midline). 3.Increase speed of head motion so long as target is in focus. 4.If you wear eyeglasses, be sure you can see target through lens (therapist will give specific instructions for bifocal / progressive lenses). 5.These exercises may provoke dizziness or nausea. Work through these symptoms. If too dizzy, slow head movement slightly. Rest between each exercise. 6.Exercises demand concentration; avoid distractions.  Copyright  VHI. All rights reserved.

## 2016-02-11 ENCOUNTER — Other Ambulatory Visit: Payer: Self-pay | Admitting: Internal Medicine

## 2016-02-22 ENCOUNTER — Ambulatory Visit (INDEPENDENT_AMBULATORY_CARE_PROVIDER_SITE_OTHER): Payer: PPO | Admitting: Women's Health

## 2016-02-22 ENCOUNTER — Encounter: Payer: Self-pay | Admitting: Women's Health

## 2016-02-22 VITALS — BP 118/80 | Ht 60.0 in | Wt 147.0 lb

## 2016-02-22 DIAGNOSIS — Z23 Encounter for immunization: Secondary | ICD-10-CM | POA: Diagnosis not present

## 2016-02-22 DIAGNOSIS — Z8542 Personal history of malignant neoplasm of other parts of uterus: Secondary | ICD-10-CM | POA: Diagnosis not present

## 2016-02-22 DIAGNOSIS — Z01419 Encounter for gynecological examination (general) (routine) without abnormal findings: Secondary | ICD-10-CM | POA: Diagnosis not present

## 2016-02-22 DIAGNOSIS — F411 Generalized anxiety disorder: Secondary | ICD-10-CM | POA: Diagnosis not present

## 2016-02-22 MED ORDER — DIAZEPAM 2 MG PO TABS: 2.0000 mg | ORAL_TABLET | Freq: Four times a day (QID) | ORAL | 0 refills | Status: DC | PRN

## 2016-02-22 NOTE — Progress Notes (Addendum)
Emily Thompson [birth year] [id number]    History:    Presents for breast and pelvic exam.  01/2015 robotic hysterectomy BSO due to stage I endometrial cancer with negative lymph nodes. 2003 L. breast cancer  with reconstruction in 2005, had been on tamoxifen. Palpitations, GERD, HTN, HLD, and back pain managed by primary care. 2016 T score -2.2 left femur FRAX 12%/2.2%, -4% change in 2 years. Unable to tolerate Fosamax or Boniva. Admits to exercise (pilates). Admits to back pain and currently doing physical therapy. MRI schedule and expresses anxiety r/t this procedure. Benign Colon polyp 2014. Has annual skin checks r/t right face melanoma. Not sexually active.  Past medical history, past surgical history, family history and social history were all reviewed and documented in the EPIC chart. 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 Recent travel with long-term partner, widow.  ROS:  A ROS was performed and pertinent positives and negatives are included.  Exam:  Vitals:   02/22/16 0955  BP: 118/80  Weight: 147 lb (66.7 kg)  Height: 5' (1.524 m)   Body mass index is 28.71 kg/m.   General appearance:  Normal Thyroid:  Symmetrical, normal in size, without palpable masses or nodularity. Respiratory  Auscultation:  Clear without wheezing or rhonchi Cardiovascular  Auscultation:  Regular rate, without rubs, murmurs or gallops  Edema/varicosities:  Not grossly evident Abdominal  Soft,nontender, without masses, guarding or rebound.  Liver/spleen:  No organomegaly noted  Hernia:  None appreciated  Lower abdomen with old transversal c-section scar.  Skin  Inspection:  Grossly normal. Scattered Keratosis on upper body.    Breasts: Examined lying and sitting.     Right:  previous lift, without masses, retractions, or discharge.     Left: Large lumpectomy with saline implant. Gentitourinary   Inguinal/mons:  Normal without inguinal adenopathy  External genitalia:   Normal  BUS/Urethra/Skene's glands:  Normal  Vagina:  Atrophic  Cervix:  Not present   Uterus: Not present  Adnexa/parametria: BSO      Anus and perineum: Normal    Assessment/Plan:  70 y.o.  WWF G1P1 with 1 adopted for annual breast and pelvic exam.  2003 Left Breast Cancer- resolved 01/2015  endometrial CA with robotic hysterectomy with BSO Anxiety Osteopenia out elevated FRAX Back Pain/ Palpitations/GERD/HTN/HLD-managed by primary care  Melanoma dermatologist manages  Plan: Continue SBEs and annual mammograms. PAP collected cuff. Back pain with MRI needed. Valium 2mg  prescription given for anxiety prior to test.. Discussed home safety,  fall prevention and exercise. Continue annual skin checks.  Flu shot today.    [name], 10:44 AM 02/22/2016

## 2016-02-22 NOTE — Patient Instructions (Signed)

## 2016-02-23 ENCOUNTER — Encounter: Payer: Self-pay | Admitting: Internal Medicine

## 2016-02-23 ENCOUNTER — Ambulatory Visit (INDEPENDENT_AMBULATORY_CARE_PROVIDER_SITE_OTHER): Payer: PPO | Admitting: Internal Medicine

## 2016-02-23 VITALS — BP 118/66 | HR 63 | Temp 98.2°F | Wt 149.4 lb

## 2016-02-23 DIAGNOSIS — G8929 Other chronic pain: Secondary | ICD-10-CM

## 2016-02-23 DIAGNOSIS — I10 Essential (primary) hypertension: Secondary | ICD-10-CM | POA: Diagnosis not present

## 2016-02-23 DIAGNOSIS — M545 Low back pain: Secondary | ICD-10-CM

## 2016-02-23 LAB — PAP IG W/ RFLX HPV ASCU

## 2016-02-23 MED ORDER — MELOXICAM 15 MG PO TABS: 15.0000 mg | ORAL_TABLET | Freq: Every day | ORAL | 3 refills | Status: DC

## 2016-02-23 MED ORDER — DILTIAZEM HCL ER BEADS 240 MG PO CP24: 240.0000 mg | ORAL_CAPSULE | Freq: Every day | ORAL | 11 refills | Status: DC

## 2016-02-23 MED ORDER — HYDROCHLOROTHIAZIDE 25 MG PO TABS: 25.0000 mg | ORAL_TABLET | Freq: Every day | ORAL | 0 refills | Status: DC

## 2016-02-23 NOTE — Progress Notes (Signed)
Subjective:    Patient ID: Emily Thompson, female    DOB: 25-Mar-1946, 70 y.o.   MRN: [id number]  DOS:  02/23/2016 Type of visit - description : Follow-up Interval history: Back pain: Status post physical therapy and pilates, doing better but not completely well. Takes meloxicam rarely, no more than once a week. Would like to see a specialist. HTN: Her BPs ranging from 112-150/50-70. Good compliance with patients..    Review of Systems  Reports no nausea, vomiting, blood in the stool or abdominal pain. Edema in the lower extremities has increased lately. Still has occasional pretibial paresthesias worse on the left.  Past Medical History:  Diagnosis Date  . BCC (basal cell carcinoma of skin) 2013, 2016  . Breast cancer ([place]) 2003   chemo, radiation, lumpectomy, reconstructive surgery   . Complication of anesthesia   . Dyslipidemia   . Endometrial cancer ([place])   . GERD (gastroesophageal reflux disease)   . Hypertension   . Melanoma ([place]) 2012   righ side face; basal cell (nose)   . Osteopenia   . PONV (postoperative nausea and vomiting)   . SVT (supraventricular tachycardia) (HCC)    used to see Dr Rex Kras     Past Surgical History:  Procedure Laterality Date  . ABDOMINAL HYSTERECTOMY  2016  . BASAL CELL CARCINOMA EXCISION    . BREAST LUMPECTOMY Left 07/2001  . BREAST RECONSTRUCTION  2005  . [place]  . COLONOSCOPY W/ POLYPECTOMY  2013   NEXT DUE IN 5 YRS  . NM MYOCAR PERF WALL MOTION  01/2006   bruce myoview; no evidence of inducible ischemia, anterior wall thinning without evidence of ischemia; post-stress EF 88%; low risk scan   . NOSE SURGERY     basal cell carcinoma removed  . ROBOTIC ASSISTED TOTAL HYSTERECTOMY WITH BILATERAL SALPINGO OOPHERECTOMY Bilateral 01/27/2015   Procedure: ROBOTIC ASSISTED TOTAL HYSTERECTOMY WITH BILATERAL SALPINGO OOPHORECTOMY AND SENTINAL NODE BIOPSY;  Surgeon: Everitt Amber, MD;  Location: [facility];  Service: Gynecology;   Laterality: Bilateral;  . SKIN CANCER EXCISION     melanoma  . TONSILLECTOMY AND ADENOIDECTOMY  1952  . TRANSTHORACIC ECHOCARDIOGRAM  12/2005   EF 50-55%; mild MR; trace TR  . TUBAL LIGATION  1978  . VULVA /PERINEUM BIOPSY     PAPILLOMA    Social History   Social History  . Marital status: Widowed    Spouse name: N/A  . Number of children: 2  . Years of education: BSN   Occupational History  . 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 Smoking status: Former Smoker    Years: 10.00    Types: Cigarettes    Quit date: 05/20/1973  . Smokeless tobacco: Never Used  . Alcohol use 2.4 oz/week    2 Glasses of wine, 2 Cans of beer per week     Comment: 4 A WEEK  . Drug use: No  . Sexual activity: Yes    Birth control/ protection: Post-menopausal   Other Topics Concern  . Not on file   Social History Narrative   Widow, 1 adopted and 1 natural child   Live by herself        Medication List       Accurate as of 02/23/16 11:39 AM. Always use your most recent med list.          aspirin 81 MG tablet Take 81 mg by mouth daily.  CALCIUM-VITAMIN D-VITAMIN K PO Take 1,200 mg by mouth daily.   cholecalciferol 1000 units tablet Commonly known as:  VITAMIN D Take 1,000 Units by mouth daily.   diazepam 2 MG tablet Commonly known as:  VALIUM Take 1 tablet (2 mg total) by mouth every 6 (six) hours as needed for anxiety.   diltiazem 360 MG 24 hr capsule Commonly known as:  TIAZAC Take 1 capsule (360 mg total) by mouth daily.   ezetimibe 10 MG tablet Commonly known as:  ZETIA Take 1 tablet (10 mg total) by mouth daily.   lansoprazole 15 MG capsule Commonly known as:  PREVACID Take 15 mg by mouth daily at 12 noon.   meloxicam 7.5 MG tablet Commonly known as:  MOBIC Take 15 mg by mouth daily as needed for pain.   PRESERVISION AREDS 2 PO Take 1 capsule by mouth daily.   riboflavin 100 MG Tabs tablet Commonly known as:   VITAMIN B-2 Take 200 mg by mouth 2 (two) times daily.   telmisartan 40 MG tablet Commonly known as:  MICARDIS Take 1 tablet (40 mg total) by mouth every evening.          Objective:   Physical Exam BP 118/66 (BP Location: Left Arm, Patient Position: Sitting, Cuff Size: Normal)   Pulse 63   Temp 98.2 F (36.8 C) (Oral)   Wt 149 lb 6.4 oz (67.8 kg)   LMP 05/14/1997   SpO2 95%   BMI 29.18 kg/m  General:   Well developed, well nourished . NAD.  HEENT:  Normocephalic . Face symmetric, atraumatic Lungs:  CTA B Normal respiratory effort, no intercostal retractions, no accessory muscle use. Heart: RRR,  no murmur.  +/+++ pretibial edema bilaterally  MSK: No TTP at the lower extremities Neurologic:  alert & oriented X3.  Speech normal, gait appropriate for age and unassisted Motor symmetric, DTRs today are symmetric.  Psych--  Cognition and judgment appear intact.  Cooperative with normal attention span and concentration.  Behavior appropriate. No anxious or depressed appearing.      Assessment & Plan:   Assessment HTN Hyperlipidemia -- statin intolerant  GERD Osteopenia --dexa 2016 (per gyn) MSK: ---DJD,  NSAIDs prn --Back pain, saw ortho Dr Tommie Raymond ~ 2011, saw the spine center ~ 2015 , dx w/  spinal stenosis (no MRI, clinical dx ) Cough-- hycodan prn (tolerates well small doses hydrocodone) Oncology: --Breast cancer 2003 --Endometrial cancer, H-BSO, will see hematology x 5 years --Melanoma, BCC  Lung nodule, 89mm, per CTs, last CT 09/30/2015 : stable  H/o SVT  HOH  PLAN: Back pain: See previous visit, S/P PT, pilates. Better but not completely well. She will contact a orthopedic doctor and let me know if she needs a referral. She is actually taking NSAIDs very sporadically, request a prescription which was issued.  HTN: We increased Tiazac dose from 240 to 360 mg few months ago, since then has noted more swelling and systolic BP is going in the 150s sometimes.  We agreed to: Continue Micardis 40 mg, declined to increase dose (at some point she suspected it cause cough) Decreased Tiazac to 240 and add HCTZ. BMP in 2 weeks. RTC 3 months for a checkup.

## 2016-02-23 NOTE — Assessment & Plan Note (Signed)
Back pain: See previous visit, S/P PT, pilates. Better but not completely well. She will contact a orthopedic doctor and let me know if she needs a referral. She is actually taking NSAIDs very sporadically, request a prescription which was issued.  HTN: We increased Tiazac dose from 240 to 360 mg few months ago, since then has noted more swelling and systolic BP is going in the 150s sometimes. We agreed to: Continue Micardis 40 mg, declined to increase dose (at some point she suspected it cause cough) Decreased Tiazac to 240 and add HCTZ. BMP in 2 weeks. RTC 3 months for a checkup.

## 2016-02-23 NOTE — Patient Instructions (Signed)
Continue Micardis  Decrease Tiazac to 240 mg daily  Add hydrochlorothiazide 25 mg one tablet every morning  Check the  blood pressure 2 or 3 times a   Week  Be sure your blood pressure is between 110/65 and  145/85.  if it is consistently higher or lower, let me know   Schedule labs to be done 2 weeks from today: BMP, hypertension  Next visit in 3 months for checkup

## 2016-02-23 NOTE — Progress Notes (Signed)
Pre visit review using our clinic review tool, if applicable. No additional management support is needed unless otherwise documented below in the visit note. 

## 2016-02-24 ENCOUNTER — Other Ambulatory Visit: Payer: Self-pay

## 2016-02-24 DIAGNOSIS — I10 Essential (primary) hypertension: Secondary | ICD-10-CM

## 2016-03-09 ENCOUNTER — Other Ambulatory Visit (INDEPENDENT_AMBULATORY_CARE_PROVIDER_SITE_OTHER): Payer: PPO

## 2016-03-09 DIAGNOSIS — I10 Essential (primary) hypertension: Secondary | ICD-10-CM

## 2016-03-09 DIAGNOSIS — M5416 Radiculopathy, lumbar region: Secondary | ICD-10-CM | POA: Diagnosis not present

## 2016-03-09 LAB — BASIC METABOLIC PANEL
BUN: 23 mg/dL (ref 6–23)
CO2: 31 mEq/L (ref 19–32)
Calcium: 9.7 mg/dL (ref 8.4–10.5)
Chloride: 102 mEq/L (ref 96–112)
Creatinine, Ser: 0.92 mg/dL (ref 0.40–1.20)
GFR: 63.73 mL/min (ref >60.00)
Glucose, Bld: 87 mg/dL (ref 70–99)
Potassium: 3.6 mEq/L (ref 3.5–5.1)
Sodium: 140 mEq/L (ref 135–145)

## 2016-03-19 ENCOUNTER — Other Ambulatory Visit: Payer: Self-pay | Admitting: Internal Medicine

## 2016-03-22 ENCOUNTER — Other Ambulatory Visit: Payer: Self-pay | Admitting: Orthopedic Surgery

## 2016-03-22 DIAGNOSIS — M5416 Radiculopathy, lumbar region: Secondary | ICD-10-CM

## 2016-04-01 ENCOUNTER — Ambulatory Visit
Admission: RE | Admit: 2016-04-01 | Discharge: 2016-04-01 | Disposition: A | Discharge status: Home / Self Care | Payer: PPO | Source: Ambulatory Visit | Attending: Orthopedic Surgery | Admitting: Orthopedic Surgery

## 2016-04-01 DIAGNOSIS — M5416 Radiculopathy, lumbar region: Secondary | ICD-10-CM

## 2016-04-01 DIAGNOSIS — M48061 Spinal stenosis, lumbar region without neurogenic claudication: Secondary | ICD-10-CM | POA: Diagnosis not present

## 2016-04-12 DIAGNOSIS — H353131 Nonexudative age-related macular degeneration, bilateral, early dry stage: Secondary | ICD-10-CM | POA: Diagnosis not present

## 2016-04-13 DIAGNOSIS — M48062 Spinal stenosis, lumbar region with neurogenic claudication: Secondary | ICD-10-CM | POA: Diagnosis not present

## 2016-04-16 DIAGNOSIS — M4316 Spondylolisthesis, lumbar region: Secondary | ICD-10-CM | POA: Diagnosis not present

## 2016-04-16 DIAGNOSIS — M5416 Radiculopathy, lumbar region: Secondary | ICD-10-CM | POA: Diagnosis not present

## 2016-04-16 DIAGNOSIS — M48061 Spinal stenosis, lumbar region without neurogenic claudication: Secondary | ICD-10-CM | POA: Diagnosis not present

## 2016-04-23 DIAGNOSIS — M48062 Spinal stenosis, lumbar region with neurogenic claudication: Secondary | ICD-10-CM | POA: Diagnosis not present

## 2016-04-26 DIAGNOSIS — M4316 Spondylolisthesis, lumbar region: Secondary | ICD-10-CM | POA: Diagnosis not present

## 2016-04-26 DIAGNOSIS — M5416 Radiculopathy, lumbar region: Secondary | ICD-10-CM | POA: Diagnosis not present

## 2016-04-26 DIAGNOSIS — M48061 Spinal stenosis, lumbar region without neurogenic claudication: Secondary | ICD-10-CM | POA: Diagnosis not present

## 2016-05-22 DIAGNOSIS — M5416 Radiculopathy, lumbar region: Secondary | ICD-10-CM | POA: Diagnosis not present

## 2016-05-23 ENCOUNTER — Encounter: Payer: Self-pay | Admitting: Internal Medicine

## 2016-05-24 ENCOUNTER — Telehealth: Payer: Self-pay | Admitting: Internal Medicine

## 2016-05-24 DIAGNOSIS — I10 Essential (primary) hypertension: Secondary | ICD-10-CM

## 2016-05-24 NOTE — Telephone Encounter (Signed)
Pt called in she says that provider no longer need to see her but he still would like to have her complete labs. Scheduled pt for a lab visit but not showing orders placed.    Please place orders .   Thanks

## 2016-05-24 NOTE — Telephone Encounter (Signed)
BMP ordered

## 2016-05-24 NOTE — Telephone Encounter (Signed)
Please advise 

## 2016-05-24 NOTE — Telephone Encounter (Signed)
See message I sent the patient today. (Arrange a BMP) ================ Emily Thompson, I'm glad you are better.  I don't need to see you this Friday, I will tell my nurse to cancel the appointment however I do like to check a BMP this week or early next week.  Please respond back to this message and let us know when you like to come back for a BMP, you don't need to be fasting.  I do like to proceed with a wellness visit 06/21/2015.  JP

## 2016-05-25 ENCOUNTER — Other Ambulatory Visit (INDEPENDENT_AMBULATORY_CARE_PROVIDER_SITE_OTHER): Payer: PPO

## 2016-05-25 ENCOUNTER — Ambulatory Visit: Payer: PPO | Admitting: Internal Medicine

## 2016-05-25 ENCOUNTER — Other Ambulatory Visit: Payer: PPO

## 2016-05-25 DIAGNOSIS — I10 Essential (primary) hypertension: Secondary | ICD-10-CM

## 2016-05-25 LAB — BASIC METABOLIC PANEL
BUN: 20 mg/dL (ref 6–23)
CO2: 32 mEq/L (ref 19–32)
Calcium: 9.7 mg/dL (ref 8.4–10.5)
Chloride: 103 mEq/L (ref 96–112)
Creatinine, Ser: 0.8 mg/dL (ref 0.40–1.20)
GFR: 74.84 mL/min (ref >60.00)
Glucose, Bld: 97 mg/dL (ref 70–99)
Potassium: 3.6 mEq/L (ref 3.5–5.1)
Sodium: 139 mEq/L (ref 135–145)

## 2016-06-01 DIAGNOSIS — L570 Actinic keratosis: Secondary | ICD-10-CM | POA: Diagnosis not present

## 2016-06-01 DIAGNOSIS — Z85828 Personal history of other malignant neoplasm of skin: Secondary | ICD-10-CM | POA: Diagnosis not present

## 2016-06-01 DIAGNOSIS — L821 Other seborrheic keratosis: Secondary | ICD-10-CM | POA: Diagnosis not present

## 2016-06-01 DIAGNOSIS — Z87898 Personal history of other specified conditions: Secondary | ICD-10-CM | POA: Diagnosis not present

## 2016-06-01 DIAGNOSIS — D485 Neoplasm of uncertain behavior of skin: Secondary | ICD-10-CM | POA: Diagnosis not present

## 2016-06-07 DIAGNOSIS — M5416 Radiculopathy, lumbar region: Secondary | ICD-10-CM | POA: Diagnosis not present

## 2016-06-20 ENCOUNTER — Ambulatory Visit (INDEPENDENT_AMBULATORY_CARE_PROVIDER_SITE_OTHER): Payer: PPO | Admitting: Internal Medicine

## 2016-06-20 ENCOUNTER — Encounter: Payer: Self-pay | Admitting: Internal Medicine

## 2016-06-20 VITALS — BP 128/76 | HR 65 | Temp 98.1°F | Resp 12 | Ht 60.0 in | Wt 145.4 lb

## 2016-06-20 DIAGNOSIS — Z0000 Encounter for general adult medical examination without abnormal findings: Secondary | ICD-10-CM

## 2016-06-20 DIAGNOSIS — E785 Hyperlipidemia, unspecified: Secondary | ICD-10-CM | POA: Diagnosis not present

## 2016-06-20 DIAGNOSIS — I10 Essential (primary) hypertension: Secondary | ICD-10-CM | POA: Diagnosis not present

## 2016-06-20 DIAGNOSIS — Z1159 Encounter for screening for other viral diseases: Secondary | ICD-10-CM

## 2016-06-20 LAB — ALT: ALT: 17 U/L (ref 0–35)

## 2016-06-20 LAB — CBC WITH DIFFERENTIAL/PLATELET
Basophils Absolute: 0.1 10*3/uL (ref 0.0–0.1)
Basophils Relative: 1 % (ref 0.0–3.0)
Eosinophils Absolute: 0.4 10*3/uL (ref 0.0–0.7)
Eosinophils Relative: 4.6 % (ref 0.0–5.0)
HCT: 42.2 % (ref 36.0–46.0)
Hemoglobin: 14.1 g/dL (ref 12.0–15.0)
Lymphocytes Relative: 16.2 % (ref 12.0–46.0)
Lymphs Abs: 1.4 10*3/uL (ref 0.7–4.0)
MCHC: 33.3 g/dL (ref 30.0–36.0)
MCV: 91.6 fl (ref 78.0–100.0)
Monocytes Absolute: 0.9 10*3/uL (ref 0.1–1.0)
Monocytes Relative: 10.5 % (ref 3.0–12.0)
Neutro Abs: 5.9 10*3/uL (ref 1.4–7.7)
Neutrophils Relative %: 67.7 % (ref 43.0–77.0)
Platelets: 277 10*3/uL (ref 150.0–400.0)
RBC: 4.61 Mil/uL (ref 3.87–5.11)
RDW: 13.3 % (ref 11.5–15.5)
WBC: 8.7 10*3/uL (ref 4.0–10.5)

## 2016-06-20 LAB — AST: AST: 19 U/L (ref 0–37)

## 2016-06-20 LAB — LIPID PANEL
Cholesterol: 231 mg/dL — ABNORMAL HIGH (ref 0–200)
HDL: 79.4 mg/dL (ref >39.00)
LDL Cholesterol: 137 mg/dL — ABNORMAL HIGH (ref 0–99)
NonHDL: 151.18
Total CHOL/HDL Ratio: 3
Triglycerides: 72 mg/dL (ref 0.0–149.0)
VLDL: 14.4 mg/dL (ref 0.0–40.0)

## 2016-06-20 LAB — HEPATITIS C ANTIBODY: HCV Ab: NEGATIVE

## 2016-06-20 MED ORDER — DILTIAZEM HCL ER BEADS 240 MG PO CP24: 240.0000 mg | ORAL_CAPSULE | Freq: Every day | ORAL | 3 refills | Status: DC

## 2016-06-20 MED ORDER — HYDROCHLOROTHIAZIDE 25 MG PO TABS: 25.0000 mg | ORAL_TABLET | Freq: Every day | ORAL | 3 refills | Status: DC

## 2016-06-20 NOTE — Progress Notes (Signed)
Pre visit review using our clinic review tool, if applicable. No additional management support is needed unless otherwise documented below in the visit note. 

## 2016-06-20 NOTE — Patient Instructions (Signed)
GO TO THE LAB : Get the blood work     GO TO THE FRONT DESK Schedule your next appointment for a   routine checkup in 6-8 months 

## 2016-06-20 NOTE — Progress Notes (Signed)
Subjective:    Patient ID: Emily Thompson, female    DOB: 1946-01-30, 71 y.o.   MRN: [id number]  DOS:  06/20/2016 Type of visit - description : cpx Interval history: In general feeling well   Review of Systems  Constitutional: No fever. No chills. No unexplained wt changes. No unusual sweats  HEENT: No dental problems, no ear discharge, no facial swelling, no voice changes. No eye discharge, no eye  redness , no  intolerance to light   Respiratory: No wheezing , no  difficulty breathing. No cough , no mucus production  Cardiovascular: No CP, no leg swelling , no  Palpitations  GI:  Has noted that lately is burping more frequently, occasional acid reflux. no nausea, no vomiting, no diarrhea , no  abdominal pain.  No blood in the stools. No dysphagia, no odynophagia    Endocrine: No polyphagia, no polyuria , no polydipsia  GU: Occasional urinary frequency and mild urgency, chronic issues, no other GU symptoms.  No dysuria, gross hematuria, difficulty urinating. .  Musculoskeletal: Back pain still an issue but relatively under control  Skin: No change in the color of the skin, palor , no  Rash  Allergic, immunologic: No environmental allergies , no  food allergies  Neurological: No dizziness no  syncope. No headaches. No diplopia, no slurred, no slurred speech, no motor deficits, no facial  Numbness  Hematological: No enlarged lymph nodes, no easy bruising , no unusual bleedings  Psychiatry: No suicidal ideas, no hallucinations, no beavior problems, no confusion.  No unusual/severe anxiety, no depression   Past Medical History:  Diagnosis Date  . BCC (basal cell carcinoma of skin) 2013, 2016  . Breast cancer ([place]) 2003   chemo, radiation, lumpectomy, reconstructive surgery   . Complication of anesthesia   . Dyslipidemia   . Endometrial cancer ([place])   . GERD (gastroesophageal reflux disease)   . Hypertension   . Melanoma ([place]) 2012   righ side face; basal cell  (nose)   . Osteopenia   . PONV (postoperative nausea and vomiting)   . SVT (supraventricular tachycardia) (HCC)    used to see Dr Rex Kras     Past Surgical History:  Procedure Laterality Date  . ABDOMINAL HYSTERECTOMY  2016  . BASAL CELL CARCINOMA EXCISION    . BREAST LUMPECTOMY Left 07/2001  . BREAST RECONSTRUCTION  2005  . [place]  . COLONOSCOPY W/ POLYPECTOMY  2013   NEXT DUE IN 5 YRS  . NM MYOCAR PERF WALL MOTION  01/2006   bruce myoview; no evidence of inducible ischemia, anterior wall thinning without evidence of ischemia; post-stress EF 88%; low risk scan   . NOSE SURGERY     basal cell carcinoma removed  . ROBOTIC ASSISTED TOTAL HYSTERECTOMY WITH BILATERAL SALPINGO OOPHERECTOMY Bilateral 01/27/2015   Procedure: ROBOTIC ASSISTED TOTAL HYSTERECTOMY WITH BILATERAL SALPINGO OOPHORECTOMY AND SENTINAL NODE BIOPSY;  Surgeon: Everitt Amber, MD;  Location: [facility];  Service: Gynecology;  Laterality: Bilateral;  . SKIN CANCER EXCISION     melanoma  . TONSILLECTOMY AND ADENOIDECTOMY  1952  . TRANSTHORACIC ECHOCARDIOGRAM  12/2005   EF 50-55%; mild MR; trace TR  . TUBAL LIGATION  1978  . VULVA /PERINEUM BIOPSY     PAPILLOMA     Family History  Problem Relation Age of Onset  . Hypertension Mother   . Thyroid disease Mother     M and B (?)  . Hypertension Father   . Stroke Father  hemorrhagic CVA at age 54  . Bipolar disorder Brother   . Diabetes Brother   . Colon cancer Maternal Aunt     dx at age 61s  . Breast cancer Neg Hx     Social History   Social History  . Marital status: Widowed    Spouse name: N/A  . Number of children: 2  . Years of education: BSN   Occupational History  . 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 Smoking status: Former Smoker    Years: 10.00    Types: Cigarettes    Quit date: 05/20/1973  . Smokeless tobacco: Never Used  . Alcohol use 2.4 oz/week    2 Glasses of wine, 2 Cans of beer  per week     Comment: 4 A WEEK  . Drug use: No  . Sexual activity: Yes    Birth control/ protection: Post-menopausal   Other Topics Concern  . Not on file   Social History Narrative   Widow, 1 adopted and 1 natural child   Live by herself      Allergies as of 06/20/2016      Reactions   Dextromethorphan    Per pt, increases BP   Hydrocodone    Wires her up SEVERE NAUSEA   Statins    Headache, hot flashes   Morphine And Related    Could not move SEVERE NAUSEA   Tape Rash      Medication List       Accurate as of 06/20/16 11:59 PM. Always use your most recent med list.          aspirin 81 MG tablet Take 81 mg by mouth daily.   CALCIUM-VITAMIN D-VITAMIN K PO Take 1,200 mg by mouth daily.   cholecalciferol 1000 units tablet Commonly known as:  VITAMIN D Take 1,000 Units by mouth daily.   diltiazem 240 MG 24 hr capsule Commonly known as:  TIAZAC Take 1 capsule (240 mg total) by mouth daily.   ezetimibe 10 MG tablet Commonly known as:  ZETIA Take 1 tablet (10 mg total) by mouth daily.   hydrochlorothiazide 25 MG tablet Commonly known as:  HYDRODIURIL Take 1 tablet (25 mg total) by mouth daily.   lansoprazole 15 MG capsule Commonly known as:  PREVACID Take 15 mg by mouth daily at 12 noon.   meloxicam 15 MG tablet Commonly known as:  MOBIC Take 1 tablet (15 mg total) by mouth daily.   PRESERVISION AREDS 2 PO Take 1 capsule by mouth daily.   riboflavin 100 MG Tabs tablet Commonly known as:  VITAMIN B-2 Take 200 mg by mouth 2 (two) times daily.   telmisartan 40 MG tablet Commonly known as:  MICARDIS Take 1 tablet (40 mg total) by mouth every evening.          Objective:   Physical Exam BP 128/76 (BP Location: Left Arm, Patient Position: Sitting, Cuff Size: Small)   Pulse 65   Temp 98.1 F (36.7 C) (Oral)   Resp 12   Ht 5' (1.524 m)   Wt 145 lb 6 oz (65.9 kg)   LMP 05/14/1997   SpO2 95%   BMI 28.39 kg/m   General:   Well developed,  well nourished . NAD.  Neck: No  thyromegaly  HEENT:  Normocephalic . Face symmetric, atraumatic Lungs:  CTA B Normal respiratory effort, no intercostal retractions, no accessory muscle use. Heart: RRR,  no murmur.  No pretibial edema bilaterally  Abdomen:  Not distended, soft, non-tender. No rebound or rigidity.   Skin: Exposed areas without rash. Not pale. Not jaundice Neurologic:  alert & oriented X3.  Speech normal, gait appropriate for age and unassisted Strength symmetric and appropriate for age.  Psych: Cognition and judgment appear intact.  Cooperative with normal attention span and concentration.  Behavior appropriate. No anxious or depressed appearing.    Assessment & Plan:   Assessment HTN Hyperlipidemia -- statin intolerant  GERD Osteopenia --dexa 2016 (per gyn) MSK: ---DJD,  NSAIDs prn --Back pain, --Dr Tommie Raymond ~ 2011, saw the spine center ~ 2015 , dx w/  spinal stenosis (no MRI, clinical dx ) --2017-2018: PT, injections Dr Catha Brow Cough-- hycodan prn (tolerates well small doses hydrocodone) Oncology: --Breast cancer 2003 --Endometrial cancer, H-BSO, will see hematology x 5 years --Melanoma, BCC (Dr Delman Cheadle) Lung nodule, 46mm, per CTs, last CT 09/30/2015 : stable  H/o SVT  HOH  PLAN: HTN: Great ambulatory BPs with HCTZ, Micardis and CCBs. Recent BMP satisfactory Hyperlipidemia: On Zetia, due for labs GERD: Occ. sx including burping. Recommend to increase PPIs to twice a day for 4 weeks, if not better let me know. She would be interested on checking for H. pylori Back pain: Since the last visit, had 2 injections, still use meloxicam rarely, doing stretching and exercises. Pain is still there but relatively well controlled. H/o  melanoma, sees dermatology regularly RTC 6-8 months

## 2016-06-20 NOTE — Assessment & Plan Note (Addendum)
Td ~2014; Pneumonia shot-2010; prevnar-- 2016; had a Flu shot  Shingles shot-- completed ; consider shingrix in the future  CCS:    Dr Collene Mares , 2nd cscope  09-2011 , next 5 years [facility] @ [place] , 2017 Female care per gyn Doing well  w/ diet-exercise  Labs: AST, ALT, FLP, CBC, hep C

## 2016-06-21 NOTE — Assessment & Plan Note (Signed)
HTN: Great ambulatory BPs with HCTZ, Micardis and CCBs. Recent BMP satisfactory Hyperlipidemia: On Zetia, due for labs GERD: Occ. sx including burping. Recommend to increase PPIs to twice a day for 4 weeks, if not better let me know. She would be interested on checking for H. pylori Back pain: Since the last visit, had 2 injections, still use meloxicam rarely, doing stretching and exercises. Pain is still there but relatively well controlled. H/o  melanoma, sees dermatology regularly RTC 6-8 months

## 2016-06-27 DIAGNOSIS — D485 Neoplasm of uncertain behavior of skin: Secondary | ICD-10-CM | POA: Diagnosis not present

## 2016-06-27 DIAGNOSIS — L814 Other melanin hyperpigmentation: Secondary | ICD-10-CM | POA: Diagnosis not present

## 2016-08-07 ENCOUNTER — Telehealth: Payer: Self-pay | Admitting: Internal Medicine

## 2016-08-07 NOTE — Telephone Encounter (Signed)
Pt declined AWV at this time.  °

## 2016-08-17 ENCOUNTER — Other Ambulatory Visit: Payer: Self-pay

## 2016-08-17 MED ORDER — EZETIMIBE 10 MG PO TABS: 10.0000 mg | ORAL_TABLET | Freq: Every day | ORAL | 1 refills | Status: DC

## 2016-09-10 ENCOUNTER — Ambulatory Visit: Payer: PPO | Attending: Gynecologic Oncology | Admitting: Gynecologic Oncology

## 2016-09-10 ENCOUNTER — Encounter: Payer: Self-pay | Admitting: Gynecologic Oncology

## 2016-09-10 VITALS — BP 128/56 | HR 65 | Temp 98.6°F | Resp 18 | Wt 148.2 lb

## 2016-09-10 DIAGNOSIS — Z90710 Acquired absence of both cervix and uterus: Secondary | ICD-10-CM

## 2016-09-10 DIAGNOSIS — Z853 Personal history of malignant neoplasm of breast: Secondary | ICD-10-CM | POA: Insufficient documentation

## 2016-09-10 DIAGNOSIS — Z8542 Personal history of malignant neoplasm of other parts of uterus: Secondary | ICD-10-CM | POA: Diagnosis not present

## 2016-09-10 DIAGNOSIS — I471 Supraventricular tachycardia: Secondary | ICD-10-CM | POA: Insufficient documentation

## 2016-09-10 DIAGNOSIS — Z7982 Long term (current) use of aspirin: Secondary | ICD-10-CM | POA: Insufficient documentation

## 2016-09-10 DIAGNOSIS — Z87891 Personal history of nicotine dependence: Secondary | ICD-10-CM | POA: Diagnosis not present

## 2016-09-10 DIAGNOSIS — M8580 Other specified disorders of bone density and structure, unspecified site: Secondary | ICD-10-CM | POA: Diagnosis not present

## 2016-09-10 DIAGNOSIS — Z9079 Acquired absence of other genital organ(s): Secondary | ICD-10-CM | POA: Diagnosis not present

## 2016-09-10 DIAGNOSIS — Z08 Encounter for follow-up examination after completed treatment for malignant neoplasm: Secondary | ICD-10-CM | POA: Insufficient documentation

## 2016-09-10 DIAGNOSIS — Z90722 Acquired absence of ovaries, bilateral: Secondary | ICD-10-CM | POA: Diagnosis not present

## 2016-09-10 DIAGNOSIS — E785 Hyperlipidemia, unspecified: Secondary | ICD-10-CM | POA: Insufficient documentation

## 2016-09-10 DIAGNOSIS — I10 Essential (primary) hypertension: Secondary | ICD-10-CM | POA: Insufficient documentation

## 2016-09-10 DIAGNOSIS — C541 Malignant neoplasm of endometrium: Secondary | ICD-10-CM | POA: Diagnosis not present

## 2016-09-10 DIAGNOSIS — K219 Gastro-esophageal reflux disease without esophagitis: Secondary | ICD-10-CM | POA: Diagnosis not present

## 2016-09-10 DIAGNOSIS — Z85820 Personal history of malignant melanoma of skin: Secondary | ICD-10-CM | POA: Diagnosis not present

## 2016-09-10 NOTE — Patient Instructions (Signed)
Please notify Dr Denman George at phone number [phone] if you notice vaginal bleeding, new pelvic or abdominal pains, bloating, feeling full easy, or a change in bladder or bowel function.   Please contact Dr Serita Grit office for an appointment in April 2019.

## 2016-09-10 NOTE — Progress Notes (Signed)
POSTOPERATIVE FOLLOWUP Assessment:    71 y.o. year old with Stage IA Grade 1 endometrioid endometrial cancer.   S/p robotic hysterectomy, BSO, sentinel lymph node biopsy on 01/27/15. no LVSI, 40% myometrial invasion, negative pelvic washings and negative lymph nodes. Low risk factors for recurrence.  Plan: 1) No evidence of disease on today's visit.  2)  Return to clinic in 6 months to see Elon Alas and in 1 year to see me.  HPI:  Emily Thompson is a 71 y.o. year old G1P1001 initially seen in consultation on 01/14/15 referred by Dr Toney Rakes for grade 1 endometrial cancer.  She then underwent a robotic hysterectomy, BSO and sentinel lymph node biopsy on 10/20/60 without complications.  Her postoperative course was uncomplicated.  Her final pathologic diagnosis is a Stage IA Grade 1 endometrioid endometrial cancer with 40% lymphovascular space invasion, 6/15 mm (40%) of myometrial invasion and negative lymph nodes.  Interval Hx:  She is doing well with no complaints or symptoms concerning for endometrial cancer recurrence.   Past Medical History:  Diagnosis Date  . BCC (basal cell carcinoma of skin) 2013, 2016  . Breast cancer ([place]) 2003   chemo, radiation, lumpectomy, reconstructive surgery   . Complication of anesthesia   . Dyslipidemia   . Endometrial cancer ([place])   . GERD (gastroesophageal reflux disease)   . Hypertension   . Melanoma ([place]) 2012   righ side face; basal cell (nose)   . Osteopenia   . PONV (postoperative nausea and vomiting)   . SVT (supraventricular tachycardia) (HCC)    used to see Dr Rex Kras    Past Surgical History:  Procedure Laterality Date  . ABDOMINAL HYSTERECTOMY  2016  . BASAL CELL CARCINOMA EXCISION    . BREAST LUMPECTOMY Left 07/2001  . BREAST RECONSTRUCTION  2005  . [place]  . COLONOSCOPY W/ POLYPECTOMY  2013   NEXT DUE IN 5 YRS  . NM MYOCAR PERF WALL MOTION  01/2006   bruce myoview; no evidence of inducible ischemia, anterior wall  thinning without evidence of ischemia; post-stress EF 88%; low risk scan   . NOSE SURGERY     basal cell carcinoma removed  . ROBOTIC ASSISTED TOTAL HYSTERECTOMY WITH BILATERAL SALPINGO OOPHERECTOMY Bilateral 01/27/2015   Procedure: ROBOTIC ASSISTED TOTAL HYSTERECTOMY WITH BILATERAL SALPINGO OOPHORECTOMY AND SENTINAL NODE BIOPSY;  Surgeon: Everitt Amber, MD;  Location: [facility];  Service: Gynecology;  Laterality: Bilateral;  . SKIN CANCER EXCISION     melanoma  . TONSILLECTOMY AND ADENOIDECTOMY  1952  . TRANSTHORACIC ECHOCARDIOGRAM  12/2005   EF 50-55%; mild MR; trace TR  . TUBAL LIGATION  1978  . VULVA /PERINEUM BIOPSY     PAPILLOMA   Family History  Problem Relation Age of Onset  . Hypertension Mother   . Thyroid disease Mother     M and B (?)  . Hypertension Father   . Stroke Father     hemorrhagic CVA at age 51  . Bipolar disorder Brother   . Diabetes Brother   . Colon cancer Maternal Aunt     dx at age 34s  . Breast cancer Neg Hx    Social History   Social History  . Marital status: Widowed    Spouse name: N/A  . Number of children: 2  . Years of education: BSN   Occupational History  . 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 Smoking status: Former Smoker  Years: 10.00    Types: Cigarettes    Quit date: 05/20/1973  . Smokeless tobacco: Never Used  . Alcohol use 2.4 oz/week    2 Glasses of wine, 2 Cans of beer per week     Comment: 4 A WEEK  . Drug use: No  . Sexual activity: Yes    Birth control/ protection: Post-menopausal   Other Topics Concern  . Not on file   Social History Narrative   Widow, 1 adopted and 1 natural child   Live by herself   Allergies  Allergen Reactions  . Dextromethorphan     Per pt, increases BP  . Hydrocodone     Wires her up SEVERE NAUSEA  . Statins     Headache, hot flashes  . Morphine And Related     Could not move SEVERE NAUSEA  . Tape Rash   Current Outpatient Prescriptions  on File Prior to Visit  Medication Sig Dispense Refill  . aspirin 81 MG tablet Take 81 mg by mouth daily.     Marland Kitchen CALCIUM-VITAMIN D-VITAMIN K PO Take 1,200 mg by mouth daily.    . cholecalciferol (VITAMIN D) 1000 units tablet Take 1,000 Units by mouth daily.    Marland Kitchen diltiazem (TIAZAC) 240 MG 24 hr capsule Take 1 capsule (240 mg total) by mouth daily. 90 capsule 3  . ezetimibe (ZETIA) 10 MG tablet Take 1 tablet (10 mg total) by mouth daily. 90 tablet 1  . hydrochlorothiazide (HYDRODIURIL) 25 MG tablet Take 1 tablet (25 mg total) by mouth daily. 90 tablet 3  . lansoprazole (PREVACID) 15 MG capsule Take 15 mg by mouth daily at 12 noon.    . meloxicam (MOBIC) 15 MG tablet Take 1 tablet (15 mg total) by mouth daily. 30 tablet 3  . Multiple Vitamins-Minerals (PRESERVISION AREDS 2 PO) Take 1 capsule by mouth daily.    . riboflavin (VITAMIN B-2) 100 MG TABS Take 200 mg by mouth 2 (two) times daily.     Marland Kitchen telmisartan (MICARDIS) 40 MG tablet Take 1 tablet (40 mg total) by mouth every evening. 90 tablet 2   No current facility-administered medications on file prior to visit.      Review of systems: Constitutional:  She has no weight gain or weight loss. She has no fever or chills. Eyes: No blurred vision Ears, Nose, Mouth, Throat: No dizziness, headaches or changes in hearing. No mouth sores. Cardiovascular: No chest pain, palpitations or edema. Respiratory:  No shortness of breath, wheezing or cough Gastrointestinal: She has normal bowel movements without diarrhea or constipation. She denies any nausea or vomiting. She denies blood in her stool or heart burn. Genitourinary:  She denies pelvic pain, pelvic pressure or changes in her urinary function. She has no hematuria, dysuria, or incontinence. She has no irregular vaginal bleeding or vaginal discharge Musculoskeletal: Denies muscle weakness or joint pains.  Skin:  She has no skin changes, rashes or itching Neurological:  Denies dizziness or  headaches. No neuropathy, no numbness or tingling. Psychiatric:  She denies depression or anxiety. Hematologic/Lymphatic:   No easy bruising or bleeding   Physical Exam: Blood pressure (!) 128/56, pulse 65, temperature 98.6 F (37 C), temperature source Oral, resp. rate 18, weight 148 lb 3.2 oz (67.2 kg), last menstrual period 05/14/1997. General: Well dressed, well nourished in no apparent distress.   HEENT:  Normocephalic and atraumatic, no lesions.  Extraocular muscles intact. Sclerae anicteric. Pupils equal, round, reactive. No mouth sores or ulcers. Thyroid is normal  size, not nodular, midline. Skin:  No lesions or rashes. Abdomen:  Soft, nontender, nondistended.  No palpable masses.  No hepatosplenomegaly.  No ascites. Normal bowel sounds.  No hernias.  Incisions are well healed Genitourinary: Normal EGBUS. 48mm angioma (soft, no mass) at right posterior introitus/hymenal ring. Vaginal cuff smooth and regular.  No bleeding or discharge.  No cul de sac fullness. Extremities: No cyanosis, clubbing or edema.  No calf tenderness or erythema. No palpable cords. Psychiatric: Mood and affect are appropriate. Neurological: Awake, alert and oriented x 3. Sensation is intact, no neuropathy.  Musculoskeletal: No pain, normal strength and range of motion.    Donaciano Eva, MD

## 2016-09-26 ENCOUNTER — Other Ambulatory Visit: Payer: Self-pay | Admitting: Internal Medicine

## 2016-09-26 ENCOUNTER — Encounter: Payer: Self-pay | Admitting: Gynecology

## 2016-10-18 DIAGNOSIS — Z86010 Personal history of colonic polyps: Secondary | ICD-10-CM | POA: Diagnosis not present

## 2016-10-18 DIAGNOSIS — K219 Gastro-esophageal reflux disease without esophagitis: Secondary | ICD-10-CM | POA: Diagnosis not present

## 2016-10-18 DIAGNOSIS — Z1211 Encounter for screening for malignant neoplasm of colon: Secondary | ICD-10-CM | POA: Diagnosis not present

## 2016-10-18 DIAGNOSIS — K5730 Diverticulosis of large intestine without perforation or abscess without bleeding: Secondary | ICD-10-CM | POA: Diagnosis not present

## 2016-11-16 DIAGNOSIS — K5730 Diverticulosis of large intestine without perforation or abscess without bleeding: Secondary | ICD-10-CM | POA: Diagnosis not present

## 2016-11-16 DIAGNOSIS — Z1211 Encounter for screening for malignant neoplasm of colon: Secondary | ICD-10-CM | POA: Diagnosis not present

## 2016-11-16 DIAGNOSIS — Z86010 Personal history of colonic polyps: Secondary | ICD-10-CM | POA: Diagnosis not present

## 2016-11-16 LAB — HM COLONOSCOPY

## 2016-12-12 DIAGNOSIS — Z853 Personal history of malignant neoplasm of breast: Secondary | ICD-10-CM | POA: Diagnosis not present

## 2016-12-12 DIAGNOSIS — Z1231 Encounter for screening mammogram for malignant neoplasm of breast: Secondary | ICD-10-CM | POA: Diagnosis not present

## 2017-01-18 DIAGNOSIS — H01113 Allergic dermatitis of right eye, unspecified eyelid: Secondary | ICD-10-CM | POA: Diagnosis not present

## 2017-01-21 ENCOUNTER — Encounter: Payer: Self-pay | Admitting: Internal Medicine

## 2017-01-21 ENCOUNTER — Ambulatory Visit (INDEPENDENT_AMBULATORY_CARE_PROVIDER_SITE_OTHER): Payer: PPO | Admitting: Internal Medicine

## 2017-01-21 VITALS — BP 116/68 | HR 71 | Temp 98.0°F | Resp 14 | Ht 60.0 in | Wt 149.2 lb

## 2017-01-21 DIAGNOSIS — I10 Essential (primary) hypertension: Secondary | ICD-10-CM | POA: Diagnosis not present

## 2017-01-21 DIAGNOSIS — E785 Hyperlipidemia, unspecified: Secondary | ICD-10-CM | POA: Diagnosis not present

## 2017-01-21 LAB — BASIC METABOLIC PANEL
BUN: 16 mg/dL (ref 6–23)
CO2: 31 mEq/L (ref 19–32)
Calcium: 10.5 mg/dL (ref 8.4–10.5)
Chloride: 100 mEq/L (ref 96–112)
Creatinine, Ser: 0.89 mg/dL (ref 0.40–1.20)
GFR: 66.06 mL/min (ref >60.00)
Glucose, Bld: 88 mg/dL (ref 70–99)
Potassium: 4.1 mEq/L (ref 3.5–5.1)
Sodium: 141 mEq/L (ref 135–145)

## 2017-01-21 LAB — TSH: TSH: 1.97 u[IU]/mL (ref 0.35–4.50)

## 2017-01-21 MED ORDER — ZOSTER VAC RECOMB ADJUVANTED 50 MCG/0.5ML IM SUSR: 0.5000 mL | Freq: Once | INTRAMUSCULAR | 1 refills | Status: AC

## 2017-01-21 NOTE — Patient Instructions (Signed)
GO TO THE LAB : Get the blood work     GO TO THE FRONT DESK Schedule your next appointment for a  physical exam by February 2019  Start Shingrix  at your convenience, you need a booster between 2 and 6 months after the first dose.

## 2017-01-21 NOTE — Assessment & Plan Note (Signed)
HTN: Seems to be doing great with HCTZ, Micardis and Tiazac. Check a BMP and TSH Hyperlipidemia: Doing great on Zetia, has a very good lifestyle Insomnia: Chronic on and off,   started Hemp oil ~ 3  weeks ago and is helping great Primary care: Desires a shingles immunization, prescription printed. RTC 06-2017 cpx

## 2017-01-21 NOTE — Progress Notes (Signed)
Pre visit review using our clinic review tool, if applicable. No additional management support is needed unless otherwise documented below in the visit note. 

## 2017-01-21 NOTE — Progress Notes (Signed)
Subjective:    Patient ID: Emily Thompson, female    DOB: 1946-03-15, 71 y.o.   MRN: [id number]  DOS:  01/21/2017  Type of visit - description : rov Interval history: No major concerns. Good compliance of medication. For high cholesterol she is taking Zetia and she follows a excellent diet. She remains active and goes to [employer]. History of insomnia, 2 weeks ago started hemp oil which is helping significantly   Review of Systems   Past Medical History:  Diagnosis Date  . BCC (basal cell carcinoma of skin) 2013, 2016  . Breast cancer ([place]) 2003   chemo, radiation, lumpectomy, reconstructive surgery   . Complication of anesthesia   . Dyslipidemia   . Endometrial cancer ([place])   . GERD (gastroesophageal reflux disease)   . Hypertension   . Melanoma ([place]) 2012   righ side face; basal cell (nose)   . Osteopenia   . PONV (postoperative nausea and vomiting)   . SVT (supraventricular tachycardia) (HCC)    used to see Dr Rex Kras     Past Surgical History:  Procedure Laterality Date  . ABDOMINAL HYSTERECTOMY  2016  . BASAL CELL CARCINOMA EXCISION    . BREAST LUMPECTOMY Left 07/2001  . BREAST RECONSTRUCTION  2005  . [place]  . COLONOSCOPY W/ POLYPECTOMY  2013   NEXT DUE IN 5 YRS  . NM MYOCAR PERF WALL MOTION  01/2006   bruce myoview; no evidence of inducible ischemia, anterior wall thinning without evidence of ischemia; post-stress EF 88%; low risk scan   . NOSE SURGERY     basal cell carcinoma removed  . ROBOTIC ASSISTED TOTAL HYSTERECTOMY WITH BILATERAL SALPINGO OOPHERECTOMY Bilateral 01/27/2015   Procedure: ROBOTIC ASSISTED TOTAL HYSTERECTOMY WITH BILATERAL SALPINGO OOPHORECTOMY AND SENTINAL NODE BIOPSY;  Surgeon: Everitt Amber, MD;  Location: [facility];  Service: Gynecology;  Laterality: Bilateral;  . SKIN CANCER EXCISION     melanoma  . TONSILLECTOMY AND ADENOIDECTOMY  1952  . TRANSTHORACIC ECHOCARDIOGRAM  12/2005   EF 50-55%; mild MR; trace TR  .  TUBAL LIGATION  1978  . VULVA /PERINEUM BIOPSY     PAPILLOMA    Social History   Social History  . Marital status: Widowed    Spouse name: N/A  . Number of children: 2  . Years of education: BSN   Occupational History  . 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 Smoking status: Former Smoker    Years: 10.00    Types: Cigarettes    Quit date: 05/20/1973  . Smokeless tobacco: Never Used  . Alcohol use 2.4 oz/week    2 Glasses of wine, 2 Cans of beer per week     Comment: 4 A WEEK  . Drug use: No  . Sexual activity: Yes    Birth control/ protection: Post-menopausal   Other Topics Concern  . Not on file   Social History Narrative   Widow, 1 adopted and 1 natural child   Live by herself      Allergies as of 01/21/2017      Reactions   Dextromethorphan    Per pt, increases BP   Hydrocodone    Wires her up SEVERE NAUSEA   Statins    Headache, hot flashes   Morphine And Related    Could not move SEVERE NAUSEA   Tape Rash      Medication List  Accurate as of 01/21/17  6:56 PM. Always use your most recent med list.          aspirin 81 MG tablet Take 81 mg by mouth daily.   CALCIUM-VITAMIN D-VITAMIN K PO Take 1,200 mg by mouth daily.   cholecalciferol 1000 units tablet Commonly known as:  VITAMIN D Take 1,000 Units by mouth daily.   cyclobenzaprine 10 MG tablet Commonly known as:  FLEXERIL Take 10 mg by mouth 3 (three) times daily as needed for muscle spasms.   diltiazem 240 MG 24 hr capsule Commonly known as:  TIAZAC Take 1 capsule (240 mg total) by mouth daily.   ezetimibe 10 MG tablet Commonly known as:  ZETIA Take 1 tablet (10 mg total) by mouth daily.   hydrochlorothiazide 25 MG tablet Commonly known as:  HYDRODIURIL Take 1 tablet (25 mg total) by mouth daily.   lansoprazole 15 MG capsule Commonly known as:  PREVACID Take 15 mg by mouth daily at 12 noon.   meloxicam 15 MG tablet Commonly  known as:  MOBIC Take 1 tablet (15 mg total) by mouth daily as needed.   PRESERVISION AREDS 2 PO Take 1 capsule by mouth daily.   riboflavin 100 MG Tabs tablet Commonly known as:  VITAMIN B-2 Take 200 mg by mouth 2 (two) times daily.   telmisartan 40 MG tablet Commonly known as:  MICARDIS TAKE 1 TABLET (40 MG TOTAL) BY MOUTH EVERY EVENING.   Zoster Vac Recomb Adjuvanted injection Commonly known as:  SHINGRIX Inject 0.5 mLs into the muscle once.            Discharge Care Instructions        Start     Ordered   01/21/17 0000  Zoster Vac Recomb Adjuvanted [facility]) injection   Once     01/21/17 1111   01/21/17 6269  Basic metabolic panel     48/54/62 1111   01/21/17 0000  TSH     01/21/17 1111         Objective:   Physical Exam BP 116/68 (BP Location: Left Arm, Patient Position: Sitting, Cuff Size: Small)   Pulse 71   Temp 98 F (36.7 C) (Oral)   Resp 14   Ht 5' (1.524 m)   Wt 149 lb 4 oz (67.7 kg)   LMP 05/14/1997   SpO2 97%   BMI 29.15 kg/m  General:   Well developed, well nourished . NAD.  HEENT:  Normocephalic . Face symmetric, atraumatic Lungs:  CTA B Normal respiratory effort, no intercostal retractions, no accessory muscle use. Heart: RRR,  no murmur.  No pretibial edema bilaterally  Skin: Not pale. Not jaundice Neurologic:  alert & oriented X3.  Speech normal, gait appropriate for age and unassisted Psych--  Cognition and judgment appear intact.  Cooperative with normal attention span and concentration.  Behavior appropriate. No anxious or depressed appearing.      Assessment & Plan:   Assessment HTN Hyperlipidemia -- statin intolerant  GERD Osteopenia --dexa 2016 (per gyn) Insomnia  MSK: ---DJD,  NSAIDs prn --Back pain, --Dr Tommie Raymond ~ 2011, saw the spine center ~ 2015 , dx w/  spinal stenosis (no MRI, clinical dx ) --2017-2018: PT, injections Dr Catha Brow Cough-- hycodan prn (tolerates well small doses  hydrocodone) Oncology: --Breast cancer 2003 --Endometrial cancer, H-BSO, will see hematology x 5 years (gyn-onc) --Melanoma, BCC (Dr Delman Cheadle) Lung nodule, 47mm, per CTs, last CT 09/30/2015 : stable  H/o SVT  HOH  PLAN: HTN: Seems to  be doing great with HCTZ, Micardis and Tiazac. Check a BMP and TSH Hyperlipidemia: Doing great on Zetia, has a very good lifestyle Insomnia: Chronic on and off,   started Hemp oil ~ 3  weeks ago and is helping great Primary care: Desires a shingles immunization, prescription printed. RTC 06-2017 cpx

## 2017-02-09 ENCOUNTER — Other Ambulatory Visit: Payer: Self-pay | Admitting: Internal Medicine

## 2017-02-27 ENCOUNTER — Encounter: Payer: Self-pay | Admitting: Women's Health

## 2017-02-27 ENCOUNTER — Telehealth: Payer: Self-pay | Admitting: *Deleted

## 2017-02-27 ENCOUNTER — Ambulatory Visit (INDEPENDENT_AMBULATORY_CARE_PROVIDER_SITE_OTHER): Payer: PPO | Admitting: Women's Health

## 2017-02-27 VITALS — BP 118/78 | Ht 60.0 in | Wt 149.0 lb

## 2017-02-27 DIAGNOSIS — Z8542 Personal history of malignant neoplasm of other parts of uterus: Secondary | ICD-10-CM | POA: Diagnosis not present

## 2017-02-27 DIAGNOSIS — Z9189 Other specified personal risk factors, not elsewhere classified: Secondary | ICD-10-CM

## 2017-02-27 DIAGNOSIS — Z808 Family history of malignant neoplasm of other organs or systems: Secondary | ICD-10-CM

## 2017-02-27 DIAGNOSIS — Z853 Personal history of malignant neoplasm of breast: Secondary | ICD-10-CM

## 2017-02-27 DIAGNOSIS — Z01419 Encounter for gynecological examination (general) (routine) without abnormal findings: Secondary | ICD-10-CM | POA: Diagnosis not present

## 2017-02-27 NOTE — Progress Notes (Signed)
Emily Thompson 08-Jun-1945 [id number]    History:    Presents for breast and pelvic exam. 01/2015 robotic hysterectomy BSO due to stage I endometrial cancer with negative lymph nodes. 2003 L. breast cancer  with reconstruction in 2005, had been on tamoxifen.  2018 normal colonoscopy. 2016 T score -2.2 left femur FRAX 12%/2.2%, -4% no change in 2 years. Unable to tolerate Fosamax or Boniva. Has annual skin checks, history of right sided face melanoma. Not sexually active.  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 Widow, long-term partner.  ROS:  A ROS was performed and pertinent positives and negatives are included.  Exam:  Vitals:   02/27/17 1054  BP: 118/78  Weight: 149 lb (67.6 kg)  Height: 5' (1.524 m)   Body mass index is 29.1 kg/m.   General appearance:  Normal Thyroid:  Symmetrical, normal in size, without palpable masses or nodularity. Respiratory  Auscultation:  Clear without wheezing or rhonchi Cardiovascular  Auscultation:  Regular rate, without rubs, murmurs or gallops  Edema/varicosities:  Not grossly evident Abdominal  Soft,nontender, without masses, guarding or rebound.  Liver/spleen:  No organomegaly noted  Hernia:  None appreciated  Skin  Inspection:  Grossly normal   Breasts: Examined lying and sitting.     Right: Without masses, retractions, discharge or axillary adenopathy.     Left: Without masses, retractions, discharge or axillary adenopathy. Gentitourinary   Inguinal/mons:  Normal without inguinal adenopathy  External genitalia:  Normal  BUS/Urethra/Skene's glands:  Normal  Vagina:  Normal  Cervix:  And uterus absent  Adnexa/parametria:     Rt: Without masses or tenderness.   Lt: Without masses or tenderness.  Anus and perineum: Normal  Digital rectal exam: Normal sphincter tone without palpated masses or  tenderness  Assessment/Plan:  70 y.o. WWF G1 P1 +1 adopted for breast and pelvic exam with no complaints.  2003 Left Breast Cancer-lumpectomy/radiation 01/2015  endometrial CA with robotic hysterectomy with BSO Hypertension, hypercholesterolemia,Anxiety-primary care manages labs and meds  Osteopenia without elevated FRAX Back Pain/ Palpitations/GERD/HTN/HLD-managed by primary care  Melanoma dermatologist manages  Plan: will refer her for genetic counseling, history of 3 cancers. Continue SBEs and annual mammograms. PAP collected cuff.  Discussed home safety,  fall prevention and exercise, continue Pilates, attending exercise class 3- 4 times weekly. Continue annual skin checks.[name] [facility], 11:35 AM 02/27/2017

## 2017-02-27 NOTE — Telephone Encounter (Signed)
-----   Message from [name], NP sent at 02/27/2017 11:11 AM EDT ----- Please schedule genetic consultation, hx of breast and endometrial cancer  and melanoma.

## 2017-02-27 NOTE — Telephone Encounter (Signed)
Referral placed at [facility], they will contact pt to schedule.

## 2017-03-01 LAB — PAP IG W/ RFLX HPV ASCU

## 2017-03-05 ENCOUNTER — Encounter: Payer: Self-pay | Admitting: *Deleted

## 2017-03-07 DIAGNOSIS — M8589 Other specified disorders of bone density and structure, multiple sites: Secondary | ICD-10-CM | POA: Diagnosis not present

## 2017-03-15 ENCOUNTER — Ambulatory Visit: Payer: PPO

## 2017-03-20 ENCOUNTER — Telehealth: Payer: Self-pay | Admitting: Genetic Counselor

## 2017-03-20 ENCOUNTER — Encounter: Payer: Self-pay | Admitting: Genetic Counselor

## 2017-03-20 NOTE — Telephone Encounter (Signed)
Appointment on 04/24/17

## 2017-03-20 NOTE — Telephone Encounter (Signed)
Appt has been scheduled for the pt to see Santiago Glad on 12/12 at 2pm. Demographics and insurance information has been verified. Letter mailed to the pt.

## 2017-03-22 DIAGNOSIS — H25013 Cortical age-related cataract, bilateral: Secondary | ICD-10-CM | POA: Diagnosis not present

## 2017-03-22 DIAGNOSIS — H353121 Nonexudative age-related macular degeneration, left eye, early dry stage: Secondary | ICD-10-CM | POA: Diagnosis not present

## 2017-03-22 DIAGNOSIS — H2513 Age-related nuclear cataract, bilateral: Secondary | ICD-10-CM | POA: Diagnosis not present

## 2017-03-22 DIAGNOSIS — D3140 Benign neoplasm of unspecified ciliary body: Secondary | ICD-10-CM | POA: Diagnosis not present

## 2017-03-22 DIAGNOSIS — H353112 Nonexudative age-related macular degeneration, right eye, intermediate dry stage: Secondary | ICD-10-CM | POA: Diagnosis not present

## 2017-03-25 ENCOUNTER — Encounter: Payer: Self-pay | Admitting: Women's Health

## 2017-03-26 ENCOUNTER — Encounter: Payer: Self-pay | Admitting: *Deleted

## 2017-03-26 ENCOUNTER — Other Ambulatory Visit: Payer: Self-pay | Admitting: Internal Medicine

## 2017-04-22 ENCOUNTER — Ambulatory Visit: Payer: PPO | Admitting: Women's Health

## 2017-04-24 ENCOUNTER — Other Ambulatory Visit: Payer: PPO

## 2017-04-24 ENCOUNTER — Encounter: Payer: PPO | Admitting: Genetic Counselor

## 2017-05-01 ENCOUNTER — Encounter: Payer: Self-pay | Admitting: Women's Health

## 2017-05-01 ENCOUNTER — Ambulatory Visit: Payer: PPO | Admitting: Women's Health

## 2017-05-01 VITALS — BP 138/80 | Ht 60.0 in | Wt 145.0 lb

## 2017-05-01 DIAGNOSIS — M810 Age-related osteoporosis without current pathological fracture: Secondary | ICD-10-CM | POA: Diagnosis not present

## 2017-05-01 DIAGNOSIS — M8580 Other specified disorders of bone density and structure, unspecified site: Secondary | ICD-10-CM

## 2017-05-01 DIAGNOSIS — Z780 Asymptomatic menopausal state: Secondary | ICD-10-CM

## 2017-05-01 NOTE — Patient Instructions (Signed)
Osteoporosis Osteoporosis is the thinning and loss of density in the bones. Osteoporosis makes the bones more brittle, fragile, and likely to break (fracture). Over time, osteoporosis can cause the bones to become so weak that they fracture after a simple fall. The bones most likely to fracture are the bones in the hip, wrist, and spine. What are the causes? The exact cause is not known. What increases the risk? Anyone can develop osteoporosis. You may be at greater risk if you have a family history of the condition or have poor nutrition. You may also have a higher risk if you are:  Female.  50 years old or older.  A smoker.  Not physically active.  White or Asian.  Slender.  What are the signs or symptoms? A fracture might be the first sign of the disease, especially if it results from a fall or injury that would not usually cause a bone to break. Other signs and symptoms include:  Low back and neck pain.  Stooped posture.  Height loss.  How is this diagnosed? To make a diagnosis, your health care provider may:  Take a medical history.  Perform a physical exam.  Order tests, such as: ? A bone mineral density test. ? A dual-energy X-ray absorptiometry test.  How is this treated? The goal of osteoporosis treatment is to strengthen your bones to reduce your risk of a fracture. Treatment may involve:  Making lifestyle changes, such as: ? Eating a diet rich in calcium. ? Doing weight-bearing and muscle-strengthening exercises. ? Stopping tobacco use. ? Limiting alcohol intake.  Taking medicine to slow the process of bone loss or to increase bone density.  Monitoring your levels of calcium and vitamin D.  Follow these instructions at home:  Include calcium and vitamin D in your diet. Calcium is important for bone health, and vitamin D helps the body absorb calcium.  Perform weight-bearing and muscle-strengthening exercises as directed by your health care  provider.  Do not use any tobacco products, including cigarettes, chewing tobacco, and electronic cigarettes. If you need help quitting, ask your health care provider.  Limit your alcohol intake.  Take medicines only as directed by your health care provider.  Keep all follow-up visits as directed by your health care provider. This is important.  Take precautions at home to lower your risk of falling, such as: ? Keeping rooms well lit and clutter free. ? Installing safety rails on stairs. ? Using rubber mats in the bathroom and other areas that are often wet or slippery. Get help right away if: You fall or injure yourself. This information is not intended to replace advice given to you by your health care provider. Make sure you discuss any questions you have with your health care provider. Document Released: 02/07/2005 Document Revised: 10/03/2015 Document Reviewed: 10/08/2013 Elsevier Interactive Patient Education  2018 Elsevier Inc.  

## 2017-05-01 NOTE — Progress Notes (Signed)
71 year old  WWF G1 P1 sensitive discuss DEXA. DEXA done at [facility] T score -2.4 FRAX 14%/3.8%. Reports taking Boniva for approximately 5 years greater than 10 years ago. This was not reported on DEXA report.. Significant history 2016 endometrial cancer hysterectomy with BSO. 2003 left breast cancer, lumpectomy, chemotherapy and radiation in 5 years from [place].. Hypertension, SVT, GERD, melanoma history. Extremely healthy lifestyle of exercise 5 days weekly, tai chi.  Exam: Appears well. DEXA  03/07/2017 T score at right femoral neck -2.4 that showed significant decrease in BMD (BMD 0.583, BMD 0.63-02/2015)  but other sites no significant decrease.  Osteopenia with elevated FRAX with history of Boniva 5 years > 10 years ago  Plan: Options reviewed with Dr Roselyn Reef and Meredith Mody, continue lifestyle, repeat bisphosphate treatment, prolia or reclast. Would like to try boniva again, boniva 150 mg monthly instructions, rx given and reviewed.  Reviewed importance of continuing regular daily exercise, tai chi for balance, home safety and fall prevention discussed. History of GERD but tolerated boniva without problem.

## 2017-05-02 ENCOUNTER — Other Ambulatory Visit: Payer: Self-pay | Admitting: Women's Health

## 2017-05-02 MED ORDER — IBANDRONATE SODIUM 150 MG PO TABS: ORAL_TABLET | ORAL | 4 refills | Status: DC

## 2017-05-28 DIAGNOSIS — Z85828 Personal history of other malignant neoplasm of skin: Secondary | ICD-10-CM | POA: Diagnosis not present

## 2017-05-28 DIAGNOSIS — R234 Changes in skin texture: Secondary | ICD-10-CM | POA: Diagnosis not present

## 2017-05-28 DIAGNOSIS — L821 Other seborrheic keratosis: Secondary | ICD-10-CM | POA: Diagnosis not present

## 2017-05-28 DIAGNOSIS — Z23 Encounter for immunization: Secondary | ICD-10-CM | POA: Diagnosis not present

## 2017-05-28 DIAGNOSIS — Z87898 Personal history of other specified conditions: Secondary | ICD-10-CM | POA: Diagnosis not present

## 2017-05-28 DIAGNOSIS — C44712 Basal cell carcinoma of skin of right lower limb, including hip: Secondary | ICD-10-CM | POA: Diagnosis not present

## 2017-05-28 DIAGNOSIS — D485 Neoplasm of uncertain behavior of skin: Secondary | ICD-10-CM | POA: Diagnosis not present

## 2017-06-09 ENCOUNTER — Other Ambulatory Visit: Payer: Self-pay | Admitting: Internal Medicine

## 2017-06-26 ENCOUNTER — Telehealth: Payer: Self-pay | Admitting: *Deleted

## 2017-06-26 ENCOUNTER — Encounter: Payer: Self-pay | Admitting: Internal Medicine

## 2017-06-26 ENCOUNTER — Ambulatory Visit (INDEPENDENT_AMBULATORY_CARE_PROVIDER_SITE_OTHER): Payer: PPO | Admitting: Internal Medicine

## 2017-06-26 VITALS — BP 124/70 | HR 70 | Temp 98.0°F | Resp 14 | Ht 60.0 in | Wt 149.1 lb

## 2017-06-26 DIAGNOSIS — C44712 Basal cell carcinoma of skin of right lower limb, including hip: Secondary | ICD-10-CM | POA: Diagnosis not present

## 2017-06-26 DIAGNOSIS — Z0000 Encounter for general adult medical examination without abnormal findings: Secondary | ICD-10-CM | POA: Diagnosis not present

## 2017-06-26 LAB — COMPREHENSIVE METABOLIC PANEL
ALT: 18 U/L (ref 0–35)
AST: 22 U/L (ref 0–37)
Albumin: 4.2 g/dL (ref 3.5–5.2)
Alkaline Phosphatase: 103 U/L (ref 39–117)
BUN: 18 mg/dL (ref 6–23)
CO2: 32 mEq/L (ref 19–32)
Calcium: 9.6 mg/dL (ref 8.4–10.5)
Chloride: 105 mEq/L (ref 96–112)
Creatinine, Ser: 0.87 mg/dL (ref 0.40–1.20)
GFR: 67.73 mL/min (ref >60.00)
Glucose, Bld: 84 mg/dL (ref 70–99)
Potassium: 4.5 mEq/L (ref 3.5–5.1)
Sodium: 142 mEq/L (ref 135–145)
Total Bilirubin: 0.5 mg/dL (ref 0.2–1.2)
Total Protein: 7 g/dL (ref 6.0–8.3)

## 2017-06-26 LAB — CBC WITH DIFFERENTIAL/PLATELET
Basophils Absolute: 0.1 10*3/uL (ref 0.0–0.1)
Basophils Relative: 0.9 % (ref 0.0–3.0)
Eosinophils Absolute: 0.3 10*3/uL (ref 0.0–0.7)
Eosinophils Relative: 3.9 % (ref 0.0–5.0)
HCT: 41.6 % (ref 36.0–46.0)
Hemoglobin: 13.9 g/dL (ref 12.0–15.0)
Lymphocytes Relative: 16.2 % (ref 12.0–46.0)
Lymphs Abs: 1.1 10*3/uL (ref 0.7–4.0)
MCHC: 33.5 g/dL (ref 30.0–36.0)
MCV: 90.9 fl (ref 78.0–100.0)
Monocytes Absolute: 0.8 10*3/uL (ref 0.1–1.0)
Monocytes Relative: 11.2 % (ref 3.0–12.0)
Neutro Abs: 4.8 10*3/uL (ref 1.4–7.7)
Neutrophils Relative %: 67.8 % (ref 43.0–77.0)
Platelets: 242 10*3/uL (ref 150.0–400.0)
RBC: 4.57 Mil/uL (ref 3.87–5.11)
RDW: 13.3 % (ref 11.5–15.5)
WBC: 7 10*3/uL (ref 4.0–10.5)

## 2017-06-26 LAB — LIPID PANEL
Cholesterol: 190 mg/dL (ref 0–200)
HDL: 65.2 mg/dL (ref >39.00)
LDL Cholesterol: 111 mg/dL — ABNORMAL HIGH (ref 0–99)
NonHDL: 125.29
Total CHOL/HDL Ratio: 3
Triglycerides: 72 mg/dL (ref 0.0–149.0)
VLDL: 14.4 mg/dL (ref 0.0–40.0)

## 2017-06-26 NOTE — Patient Instructions (Addendum)
GO TO THE LAB : Get the blood work     GO TO THE FRONT DESK Schedule your next appointment for a checkup in 6-8 months  Check the  blood pressure 2  times a month   Be sure your blood pressure is between 110/65 and  135/85. If it is consistently higher or lower, let me know  Consider see one of our nurses for a Medicare wellness exam

## 2017-06-26 NOTE — Assessment & Plan Note (Addendum)
Td ~2014; Pneumonia shot-2010; prevnar-- 2016; had a Flu shot  S/p zostavax; shingrix not available -CCS:    Dr Collene Mares , 2nd cscope  09-2011; had a cscope 2018, will call GI to get records -Female care per gyn, [facility] 2018 -Doing well  w/ diet-exercise  -Labs: CBC, FLP, CMP

## 2017-06-26 NOTE — Progress Notes (Signed)
Subjective:    Patient ID: Emily Thompson, female    DOB: 01/26/1946, 72 y.o.   MRN: [id number]  DOS:  06/26/2017 Type of visit - description : cpx Interval history: In general feeling well, she remains active and eats healthy   Review of Systems  has developed a cough x 2-3 weeks, slightly different from her previous on and off cough. Worse at night, has some throat irritation with it, dry, triggering a gag reflex. Gets to some postnasal dripping.  No wheezing.  Has GERD, admits to occasional  breakthrough symptoms lately.  Also has right-sided deltoid discomfort with certain arm movements.  Occasional paresthesias @ R thumb and 2nd  finger, right hand.   Other than above, a 14 point review of systems is negative    Past Medical History:  Diagnosis Date  . BCC (basal cell carcinoma of skin) 2013, 2016  . Breast cancer ([place]) 2003   chemo, radiation, lumpectomy, reconstructive surgery   . Complication of anesthesia   . Dyslipidemia   . Endometrial cancer ([place])   . GERD (gastroesophageal reflux disease)   . Hypertension   . Melanoma ([place]) 2012   righ side face; basal cell (nose)   . Osteopenia   . PONV (postoperative nausea and vomiting)   . SVT (supraventricular tachycardia) (HCC)    used to see Dr Rex Kras     Past Surgical History:  Procedure Laterality Date  . ABDOMINAL HYSTERECTOMY  2016  . BASAL CELL CARCINOMA EXCISION    . BREAST LUMPECTOMY Left 07/2001  . BREAST RECONSTRUCTION  2005  . [place]  . COLONOSCOPY W/ POLYPECTOMY  2013   NEXT DUE IN 5 YRS  . NM MYOCAR PERF WALL MOTION  01/2006   bruce myoview; no evidence of inducible ischemia, anterior wall thinning without evidence of ischemia; post-stress EF 88%; low risk scan   . NOSE SURGERY     basal cell carcinoma removed  . ROBOTIC ASSISTED TOTAL HYSTERECTOMY WITH BILATERAL SALPINGO OOPHERECTOMY Bilateral 01/27/2015   Procedure: ROBOTIC ASSISTED TOTAL HYSTERECTOMY WITH BILATERAL SALPINGO  OOPHORECTOMY AND SENTINAL NODE BIOPSY;  Surgeon: Everitt Amber, MD;  Location: [facility];  Service: Gynecology;  Laterality: Bilateral;  . SKIN CANCER EXCISION     melanoma  . TONSILLECTOMY AND ADENOIDECTOMY  1952  . TRANSTHORACIC ECHOCARDIOGRAM  12/2005   EF 50-55%; mild MR; trace TR  . TUBAL LIGATION  1978  . VULVA /PERINEUM BIOPSY     PAPILLOMA    Social History   Socioeconomic History  . Marital status: Widowed    Spouse name: Not on file  . Number of children: 2  . Years of education: BSN  . Highest education level: Not on file  Social Needs  . Financial resource strain: Not on file  . Food insecurity - worry: Not on file  . Food insecurity - inability: Not on file  . Transportation needs - medical: Not on file  . Transportation needs - non-medical: Not on file  Occupational History  . 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 Smoking status: Former Smoker    Years: 10.00    Types: Cigarettes    Last attempt to quit: 05/20/1973    Years since quitting: 44.1  . Smokeless tobacco: Never Used  Substance and Sexual Activity  . Alcohol use: Yes    Alcohol/week: 2.4 oz    Types: 2 Glasses of wine, 2 Cans of  beer per week    Comment: 4 A WEEK  . Drug use: No  . Sexual activity: Yes    Birth control/protection: Post-menopausal  Other Topics Concern  . Not on file  Social History Narrative   Widow, 1 adopted and 1 natural child; ([place], [place])   Live by herself     Family History  Problem Relation Age of Onset  . Hypertension Mother   . Thyroid disease Mother        M and B (?)  . Hypertension Father   . Stroke Father        hemorrhagic CVA at age 64  . Bipolar disorder Brother   . Diabetes Brother   . Colon cancer Maternal Aunt        dx at age 24s  . Breast cancer Neg Hx      Allergies as of 06/26/2017      Reactions   Dextromethorphan    Per pt, increases BP   Hydrocodone    Wires her up SEVERE NAUSEA    Statins    Headache, hot flashes   Morphine And Related    Could not move SEVERE NAUSEA   Tape Rash      Medication List        Accurate as of 06/26/17 11:59 PM. Always use your most recent med list.          aspirin 81 MG tablet Take 81 mg by mouth daily.   CALCIUM-VITAMIN D-VITAMIN K PO Take 1,200 mg by mouth daily.   cholecalciferol 1000 units tablet Commonly known as:  VITAMIN D Take 1,000 Units by mouth daily.   diltiazem 240 MG 24 hr capsule Commonly known as:  TIAZAC Take 1 capsule (240 mg total) by mouth daily.   ezetimibe 10 MG tablet Commonly known as:  ZETIA Take 1 tablet (10 mg total) by mouth daily.   hydrochlorothiazide 25 MG tablet Commonly known as:  HYDRODIURIL Take 1 tablet (25 mg total) by mouth daily.   ibandronate 150 MG tablet Commonly known as:  BONIVA Take in the morning with a full glass of water, on an empty stomach, and do not  lie down for the next 30 min.   lansoprazole 15 MG capsule Commonly known as:  PREVACID Take 15 mg by mouth daily at 12 noon.   meloxicam 15 MG tablet Commonly known as:  MOBIC Take 1 tablet (15 mg total) by mouth daily as needed.   PRESERVISION AREDS 2 PO Take 1 capsule by mouth daily.   riboflavin 100 MG Tabs tablet Commonly known as:  VITAMIN B-2 Take 200 mg by mouth 2 (two) times daily.   telmisartan 40 MG tablet Commonly known as:  MICARDIS Take 1 tablet (40 mg total) every evening by mouth.          Objective:   Physical Exam BP 124/70 (BP Location: Left Arm, Patient Position: Sitting, Cuff Size: Small)   Pulse 70   Temp 98 F (36.7 C) (Oral)   Resp 14   Ht 5' (1.524 m)   Wt 149 lb 2 oz (67.6 kg)   LMP 05/14/1997   SpO2 98%   BMI 29.12 kg/m  General:   Well developed, well nourished . NAD.  Neck: No  thyromegaly  HEENT:  Normocephalic . Face symmetric, atraumatic Lungs:  CTA B Normal respiratory effort, no intercostal retractions, no accessory muscle use. Heart: RRR,  no  murmur.  No pretibial edema bilaterally  Abdomen:  Not distended,  soft, non-tender. No rebound or rigidity.   Skin: Exposed areas without rash. Not pale. Not jaundice Neurologic:  alert & oriented X3.  Speech normal, gait appropriate for age and unassisted Strength symmetric and appropriate for age. MSK: Shoulders symmetric with no atrophies.  Strength symmetric. Psych: Cognition and judgment appear intact.  Cooperative with normal attention span and concentration.  Behavior appropriate. No anxious or depressed appearing.     Assessment & Plan:   Assessment HTN Hyperlipidemia -- statin intolerant  GERD Osteopenia --dexa 2016 (per gyn); rx boniva 05-2017 @ gyn Insomnia  MSK: ---DJD,  NSAIDs prn --Back pain, --Dr Tommie Raymond ~ 2011, saw the spine center ~ 2015 , dx w/  spinal stenosis (no MRI, clinical dx ) --2017-2018: PT, injections Dr Catha Brow Cough-- hycodan prn (tolerates well small doses hydrocodone) Oncology: --Breast cancer 2003 --Endometrial cancer, H-BSO, will see hematology x 5 years (gyn-onc) --Melanoma, BCC (Dr Delman Cheadle) Lung nodule, 80mm, per CTs, last CT 09/30/2015 : stable  H/o SVT  HOH  PLAN: HTN, hyperlipidemia, GERD : controlled, continue same medications, checking labs Osteopenia:  Per gyn, started Boniva 05-2017 Cough: Likely multifactorial, pt feels could be from dry are due to the [facility] working at night, I agree. Rec to use a humidifier, Flonase, call if not better. Right shoulder pain: Self started meloxicam few days ago, GI precautions discussed, if not better recommend to see her orthopedic doctors. RCT 6-8 months

## 2017-06-26 NOTE — Progress Notes (Signed)
Pre visit review using our clinic review tool, if applicable. No additional management support is needed unless otherwise documented below in the visit note. 

## 2017-06-26 NOTE — Telephone Encounter (Signed)
Returned the patient's call and scheduled an appt for April 10th at 2:30pm

## 2017-06-27 ENCOUNTER — Telehealth: Payer: Self-pay | Admitting: *Deleted

## 2017-06-27 NOTE — Telephone Encounter (Signed)
Patient called back and moved her appt to May 8th.

## 2017-06-27 NOTE — Assessment & Plan Note (Signed)
HTN, hyperlipidemia, GERD : controlled, continue same medications, checking labs Osteopenia:  Per gyn, started Boniva 05-2017 Cough: Likely multifactorial, pt feels could be from dry are due to the [facility] working at night, I agree. Rec to use a humidifier, Flonase, call if not better. Right shoulder pain: Self started meloxicam few days ago, GI precautions discussed, if not better recommend to see her orthopedic doctors. RCT 6-8 months

## 2017-06-27 NOTE — Telephone Encounter (Signed)
Received a staff message that the patient needs to reschedule her appt. Called and left the patient a message to call the office back.

## 2017-07-08 ENCOUNTER — Encounter: Payer: Self-pay | Admitting: Internal Medicine

## 2017-07-10 DIAGNOSIS — M67911 Unspecified disorder of synovium and tendon, right shoulder: Secondary | ICD-10-CM | POA: Diagnosis not present

## 2017-07-18 ENCOUNTER — Encounter: Payer: Self-pay | Admitting: Internal Medicine

## 2017-08-09 DIAGNOSIS — M67911 Unspecified disorder of synovium and tendon, right shoulder: Secondary | ICD-10-CM | POA: Diagnosis not present

## 2017-08-15 ENCOUNTER — Telehealth: Payer: Self-pay | Admitting: *Deleted

## 2017-08-15 NOTE — Telephone Encounter (Signed)
Pt called requesting order faxed to [facility] for lumpectomy product for bra fitting this was done and signed by Elon Alas, NP order faxed to [phone]

## 2017-08-21 ENCOUNTER — Ambulatory Visit: Payer: PPO | Admitting: Gynecologic Oncology

## 2017-08-21 DIAGNOSIS — C50912 Malignant neoplasm of unspecified site of left female breast: Secondary | ICD-10-CM | POA: Diagnosis not present

## 2017-08-27 DIAGNOSIS — C50912 Malignant neoplasm of unspecified site of left female breast: Secondary | ICD-10-CM | POA: Diagnosis not present

## 2017-08-29 DIAGNOSIS — C50912 Malignant neoplasm of unspecified site of left female breast: Secondary | ICD-10-CM | POA: Diagnosis not present

## 2017-09-08 ENCOUNTER — Other Ambulatory Visit: Payer: Self-pay | Admitting: Internal Medicine

## 2017-09-09 ENCOUNTER — Other Ambulatory Visit: Payer: Self-pay | Admitting: Internal Medicine

## 2017-09-15 ENCOUNTER — Other Ambulatory Visit: Payer: Self-pay | Admitting: Internal Medicine

## 2017-09-16 ENCOUNTER — Encounter: Payer: Self-pay | Admitting: Internal Medicine

## 2017-09-18 ENCOUNTER — Inpatient Hospital Stay: Payer: PPO | Attending: Gynecologic Oncology | Admitting: Gynecologic Oncology

## 2017-09-18 ENCOUNTER — Encounter: Payer: Self-pay | Admitting: Gynecologic Oncology

## 2017-09-18 VITALS — BP 128/51 | HR 65 | Temp 98.4°F | Resp 20 | Ht 60.0 in | Wt 151.2 lb

## 2017-09-18 DIAGNOSIS — Z90722 Acquired absence of ovaries, bilateral: Secondary | ICD-10-CM | POA: Insufficient documentation

## 2017-09-18 DIAGNOSIS — C541 Malignant neoplasm of endometrium: Secondary | ICD-10-CM | POA: Insufficient documentation

## 2017-09-18 DIAGNOSIS — Z90710 Acquired absence of both cervix and uterus: Secondary | ICD-10-CM | POA: Diagnosis not present

## 2017-09-18 DIAGNOSIS — Z87891 Personal history of nicotine dependence: Secondary | ICD-10-CM | POA: Diagnosis not present

## 2017-09-18 NOTE — Patient Instructions (Signed)
Please notify Dr Denman George at phone number [phone] if you notice vaginal bleeding, new pelvic or abdominal pains, bloating, feeling full easy, or a change in bladder or bowel function.   Please return to see Elon Alas in 6 months and contact Dr Serita Grit office at the above number in the new year of 2020 to schedule a May follow-up with her.

## 2017-09-18 NOTE — Progress Notes (Signed)
POSTOPERATIVE FOLLOWUP Assessment:    72 y.o. year old with Stage IA Grade 1 endometrioid endometrial cancer.   S/p robotic hysterectomy, BSO, sentinel lymph node biopsy on 01/27/15. no LVSI, 40% myometrial invasion, negative pelvic washings and negative lymph nodes. Low risk factors for recurrence.  Plan: 1) No evidence of disease on today's visit.  2)  Return to clinic in 6 months to see Elon Alas and in 1 year to see me.  HPI:  Emily Thompson is a 72 y.o. year old G1P1001 initially seen in consultation on 01/14/15 referred by Dr Toney Rakes for grade 1 endometrial cancer.  She then underwent a robotic hysterectomy, BSO and sentinel lymph node biopsy on 06/14/17 without complications.  Her postoperative course was uncomplicated.  Her final pathologic diagnosis is a Stage IA Grade 1 endometrioid endometrial cancer with 40% lymphovascular space invasion, 6/15 mm (40%) of myometrial invasion and negative lymph nodes.  Interval Hx:  She is doing well with no complaints or symptoms concerning for endometrial cancer recurrence.   Past Medical History:  Diagnosis Date  . BCC (basal cell carcinoma of skin) 2013, 2016  . Breast cancer ([place]) 2003   chemo, radiation, lumpectomy, reconstructive surgery   . Complication of anesthesia   . Dyslipidemia   . Endometrial cancer ([place])   . GERD (gastroesophageal reflux disease)   . Hypertension   . Melanoma ([place]) 2012   righ side face; basal cell (nose)   . Osteopenia   . PONV (postoperative nausea and vomiting)   . SVT (supraventricular tachycardia) (HCC)    used to see Dr Rex Kras    Past Surgical History:  Procedure Laterality Date  . ABDOMINAL HYSTERECTOMY  2016  . BASAL CELL CARCINOMA EXCISION    . BREAST LUMPECTOMY Left 07/2001  . BREAST RECONSTRUCTION  2005  . [place]  . COLONOSCOPY W/ POLYPECTOMY  2013   NEXT DUE IN 5 YRS  . NM MYOCAR PERF WALL MOTION  01/2006   bruce myoview; no evidence of inducible ischemia, anterior wall  thinning without evidence of ischemia; post-stress EF 88%; low risk scan   . NOSE SURGERY     basal cell carcinoma removed  . ROBOTIC ASSISTED TOTAL HYSTERECTOMY WITH BILATERAL SALPINGO OOPHERECTOMY Bilateral 01/27/2015   Procedure: ROBOTIC ASSISTED TOTAL HYSTERECTOMY WITH BILATERAL SALPINGO OOPHORECTOMY AND SENTINAL NODE BIOPSY;  Surgeon: Everitt Amber, MD;  Location: [facility];  Service: Gynecology;  Laterality: Bilateral;  . SKIN CANCER EXCISION     melanoma  . TONSILLECTOMY AND ADENOIDECTOMY  1952  . TRANSTHORACIC ECHOCARDIOGRAM  12/2005   EF 50-55%; mild MR; trace TR  . TUBAL LIGATION  1978  . VULVA /PERINEUM BIOPSY     PAPILLOMA   Family History  Problem Relation Age of Onset  . Hypertension Mother   . Thyroid disease Mother        M and B (?)  . Hypertension Father   . Stroke Father        hemorrhagic CVA at age 38  . Bipolar disorder Brother   . Diabetes Brother   . Colon cancer Maternal Aunt        dx at age 67s  . Breast cancer Neg Hx    Social History   Socioeconomic History  . Marital status: Widowed    Spouse name: Not on file  . Number of children: 2  . Years of education: BSN  . Highest education level: Not on file  Occupational History  . 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 Financial resource strain: Not on file  . Food insecurity:    Worry: Not on file    Inability: Not on file  . Transportation needs:    Medical: Not on file    Non-medical: Not on file  Tobacco Use  . Smoking status: Former Smoker    Years: 10.00    Types: Cigarettes    Last attempt to quit: 05/20/1973    Years since quitting: 44.3  . Smokeless tobacco: Never Used  Substance and Sexual Activity  . Alcohol use: Yes    Alcohol/week: 2.4 oz    Types: 2 Glasses of wine, 2 Cans of beer per week    Comment: 4 A WEEK  . Drug use: No  . Sexual activity: Yes    Birth control/protection: Post-menopausal  Lifestyle  . Physical  activity:    Days per week: Not on file    Minutes per session: Not on file  . Stress: Not on file  Relationships  . Social connections:    Talks on phone: Not on file    Gets together: Not on file    Attends religious service: Not on file    Active member of club or organization: Not on file    Attends meetings of clubs or organizations: Not on file    Relationship status: Not on file  . Intimate partner violence:    Fear of current or ex partner: Not on file    Emotionally abused: Not on file    Physically abused: Not on file    Forced sexual activity: Not on file  Other Topics Concern  . Not on file  Social History Narrative   Widow, 1 adopted and 1 natural child; ([place], [place])   Live by herself   Allergies  Allergen Reactions  . Dextromethorphan     Per pt, increases BP  . Hydrocodone     Wires her up SEVERE NAUSEA  . Statins     Headache, hot flashes  . Morphine And Related     Could not move SEVERE NAUSEA  . Tape Rash   Current Outpatient Medications on File Prior to Visit  Medication Sig Dispense Refill  . aspirin 81 MG tablet Take 81 mg by mouth daily.     Marland Kitchen CALCIUM-VITAMIN D-VITAMIN K PO Take 1,200 mg by mouth daily.    . cholecalciferol (VITAMIN D) 1000 units tablet Take 1,000 Units by mouth daily.    Marland Kitchen diltiazem (TIAZAC) 240 MG 24 hr capsule Take 1 capsule (240 mg total) by mouth daily. 90 capsule 2  . ezetimibe (ZETIA) 10 MG tablet Take 1 tablet (10 mg total) by mouth daily. 90 tablet 2  . hydrochlorothiazide (HYDRODIURIL) 25 MG tablet Take 1 tablet (25 mg total) by mouth daily. 90 tablet 2  . ibandronate (BONIVA) 150 MG tablet Take in the morning with a full glass of water, on an empty stomach, and do not  lie down for the next 30 min. (Patient taking differently: Take 150 mg by mouth every 30 (thirty) days. Take in the morning with a full glass of water, on an empty stomach, and do not  lie down for the next 30 min.) 3 tablet 4  . lansoprazole (PREVACID) 15  MG capsule Take 15 mg by mouth daily at 12 noon.    . meloxicam (MOBIC) 15 MG tablet Take 1 tablet (15 mg total) by mouth daily  as needed.    . Multiple Vitamins-Minerals (PRESERVISION AREDS 2 PO) Take 1 capsule by mouth daily.    . riboflavin (VITAMIN B-2) 100 MG TABS Take 200 mg by mouth 2 (two) times daily.     Marland Kitchen telmisartan (MICARDIS) 40 MG tablet Take 1 tablet (40 mg total) by mouth every evening. 90 tablet 1   No current facility-administered medications on file prior to visit.      Review of systems: Constitutional:  She has no weight gain or weight loss. She has no fever or chills. Eyes: No blurred vision Ears, Nose, Mouth, Throat: No dizziness, headaches or changes in hearing. No mouth sores. Cardiovascular: No chest pain, palpitations or edema. Respiratory:  No shortness of breath, wheezing or cough Gastrointestinal: She has normal bowel movements without diarrhea or constipation. She denies any nausea or vomiting. She denies blood in her stool or heart burn. Genitourinary:  She denies pelvic pain, pelvic pressure or changes in her urinary function. She has no hematuria, dysuria, or incontinence. She has no irregular vaginal bleeding or vaginal discharge Musculoskeletal: Denies muscle weakness or joint pains.  Skin:  She has no skin changes, rashes or itching Neurological:  Denies dizziness or headaches. No neuropathy, no numbness or tingling. Psychiatric:  She denies depression or anxiety. Hematologic/Lymphatic:   No easy bruising or bleeding   Physical Exam: Blood pressure (!) 128/51, pulse 65, temperature 98.4 F (36.9 C), temperature source Oral, resp. rate 20, height 5' (1.524 m), weight 151 lb 3.2 oz (68.6 kg), last menstrual period 05/14/1997, SpO2 99 %. General: Well dressed, well nourished in no apparent distress.   HEENT:  Normocephalic and atraumatic, no lesions.  Extraocular muscles intact. Sclerae anicteric. Pupils equal, round, reactive. No mouth sores or ulcers.  Thyroid is normal size, not nodular, midline. Skin:  No lesions or rashes. Abdomen:  Soft, nontender, nondistended.  No palpable masses.  No hepatosplenomegaly.  No ascites. Normal bowel sounds.  No hernias.  Incisions are well healed Genitourinary: Normal EGBUS. 28mm angioma (soft, no mass) at right posterior introitus/hymenal ring. Vaginal cuff smooth and regular.  No bleeding or discharge.  No cul de sac fullness. Extremities: No cyanosis, clubbing or edema.  No calf tenderness or erythema. No palpable cords. Psychiatric: Mood and affect are appropriate. Neurological: Awake, alert and oriented x 3. Sensation is intact, no neuropathy.  Musculoskeletal: No pain, normal strength and range of motion.    Thereasa Solo, MD

## 2017-10-30 ENCOUNTER — Encounter: Payer: Self-pay | Admitting: Women's Health

## 2017-10-30 ENCOUNTER — Ambulatory Visit: Payer: PPO | Admitting: Women's Health

## 2017-10-30 ENCOUNTER — Telehealth: Payer: Self-pay | Admitting: *Deleted

## 2017-10-30 VITALS — BP 122/80

## 2017-10-30 DIAGNOSIS — N644 Mastodynia: Secondary | ICD-10-CM | POA: Diagnosis not present

## 2017-10-30 MED ORDER — BETAMETHASONE VALERATE 0.1 % EX OINT: TOPICAL_OINTMENT | CUTANEOUS | 0 refills | Status: DC

## 2017-10-30 NOTE — Telephone Encounter (Signed)
Patient scheduled on 10/31/17 @ 1:30pm at [facility], order faxed, pt aware.

## 2017-10-30 NOTE — Telephone Encounter (Signed)
-----   Message from [name], NP sent at 10/30/2017  8:38 AM EDT ----- Needs left breast diagnostic breast mammogram and [place], has implant and hx of lt breast ca in 2003, nipple tenderness, shooting pains in outer quad,  No palpable nodule    SOLIS  Can go anytime

## 2017-10-30 NOTE — Progress Notes (Signed)
72 y/o WWF G1P1 presents for c/o superficial left breast tenderness outer aspect and at [place] for about a week.  Has had breast discomfort for years but has increased and slightly changed.  Discomfort not associated with any known events. Denies lumps, erythema, fever, or nipple drainage. Significant history includes 2003 left breast cancer, lumpectomy, chemotherapy, and radiation and 5 years from [place]. Followed by oncologist. Left breast saline implant. 12/2016 Mammogram results negative. Extreme healthy lifestyle of exercise 5 days/week including tai chi.  Medical problems include hypertension, heart disease, osteopenia managed by primary care.  2016 [facility] with BSO for endometrial cancer no HRT.    Exam: Appears well. Bilateral breasts exam sitting and lying position without visible dimpling, retractions or nipple discharge to right breast, left breast well-healed lumpectomy scar of left outer quadrant, retracted at nipple/no change.  No palpable nodules in either breast, no visible erythema.  Right breast reduction, saline implant to left breast  Left breast mastodynia 2003 left breast cancer lumpectomy/radiation and chemotherapy with saline implant  Plan: Discussed normal findings with physical exam. Order placed for diagnostic mammogram and ultrasound of left breast at [facility] scheduled for tomorrow.  Refill submitted for betamethasone valerate 0.1% BID PRN per request.

## 2017-10-31 DIAGNOSIS — R928 Other abnormal and inconclusive findings on diagnostic imaging of breast: Secondary | ICD-10-CM | POA: Diagnosis not present

## 2017-11-10 ENCOUNTER — Other Ambulatory Visit: Payer: Self-pay | Admitting: Internal Medicine

## 2017-12-18 DIAGNOSIS — C50912 Malignant neoplasm of unspecified site of left female breast: Secondary | ICD-10-CM | POA: Diagnosis not present

## 2018-01-02 DIAGNOSIS — Z85828 Personal history of other malignant neoplasm of skin: Secondary | ICD-10-CM | POA: Diagnosis not present

## 2018-01-02 DIAGNOSIS — L821 Other seborrheic keratosis: Secondary | ICD-10-CM | POA: Diagnosis not present

## 2018-01-02 DIAGNOSIS — L570 Actinic keratosis: Secondary | ICD-10-CM | POA: Diagnosis not present

## 2018-01-02 DIAGNOSIS — Z87898 Personal history of other specified conditions: Secondary | ICD-10-CM | POA: Diagnosis not present

## 2018-01-29 ENCOUNTER — Ambulatory Visit (INDEPENDENT_AMBULATORY_CARE_PROVIDER_SITE_OTHER): Payer: PPO | Admitting: Internal Medicine

## 2018-01-29 ENCOUNTER — Encounter: Payer: Self-pay | Admitting: Internal Medicine

## 2018-01-29 VITALS — BP 124/58 | HR 67 | Temp 97.7°F | Resp 16 | Ht 60.0 in | Wt 149.1 lb

## 2018-01-29 DIAGNOSIS — I10 Essential (primary) hypertension: Secondary | ICD-10-CM

## 2018-01-29 DIAGNOSIS — J301 Allergic rhinitis due to pollen: Secondary | ICD-10-CM

## 2018-01-29 DIAGNOSIS — E785 Hyperlipidemia, unspecified: Secondary | ICD-10-CM

## 2018-01-29 MED ORDER — HYDROCODONE-HOMATROPINE 5-1.5 MG/5ML PO SYRP: 5.0000 mL | ORAL_SOLUTION | Freq: Three times a day (TID) | ORAL | 0 refills | Status: DC | PRN

## 2018-01-29 NOTE — Assessment & Plan Note (Signed)
HTN: Well-controlled on diltiazem, Micardis HCTZ.  Last BMP satisfactory Hyperlipidemia: On Zetia, doing great with lifestyle, last LDL satisfactory Allergies: On Flonase and very rarely use Hycodan which is refilled today. Shoulder pain: Sees Dr. Tamera Punt, on conservative treatment, was told she has calcification of the rotator cuff.  Did some acupuncture and cupping and that seems to help as well. Preventive care: Had Shingrix # 1 @ pharmacy, #2 pending.  Plans to have a flu shot this fall. RTC 06-2017

## 2018-01-29 NOTE — Progress Notes (Signed)
Pre visit review using our clinic review tool, if applicable. No additional management support is needed unless otherwise documented below in the visit note. 

## 2018-01-29 NOTE — Patient Instructions (Signed)
   GO TO THE FRONT DESK Schedule your next appointment for a physical by 06-2018

## 2018-01-29 NOTE — Progress Notes (Signed)
Subjective:    Patient ID: Emily Thompson, female    DOB: 04/26/1946, 72 y.o.   MRN: [id number]  DOS:  01/29/2018 Type of visit - description : f/u Interval history: HTN: Good compliance w/  medication. Shoulder pain, seen by orthopedic surgery, some improvement Allergies: Needs a refill on Hycodan. Concerned about superficial varicose veins, they hurt from time to time.  Denies leg swelling, redness or warmness.   Review of Systems Denies chest pain or difficulty breathing No nausea or vomiting She remains very active  Past Medical History:  Diagnosis Date  . BCC (basal cell carcinoma of skin) 2013, 2016  . Breast cancer ([place]) 2003   chemo, radiation, lumpectomy, reconstructive surgery   . Complication of anesthesia   . Dyslipidemia   . Endometrial cancer ([place])   . GERD (gastroesophageal reflux disease)   . Hypertension   . Melanoma ([place]) 2012   righ side face; basal cell (nose)   . Osteopenia   . PONV (postoperative nausea and vomiting)   . SVT (supraventricular tachycardia) (HCC)    used to see Dr Rex Kras     Past Surgical History:  Procedure Laterality Date  . ABDOMINAL HYSTERECTOMY  2016  . BASAL CELL CARCINOMA EXCISION    . BREAST LUMPECTOMY Left 07/2001  . BREAST RECONSTRUCTION  2005  . [place]  . COLONOSCOPY W/ POLYPECTOMY  2013   NEXT DUE IN 5 YRS  . NM MYOCAR PERF WALL MOTION  01/2006   bruce myoview; no evidence of inducible ischemia, anterior wall thinning without evidence of ischemia; post-stress EF 88%; low risk scan   . NOSE SURGERY     basal cell carcinoma removed  . ROBOTIC ASSISTED TOTAL HYSTERECTOMY WITH BILATERAL SALPINGO OOPHERECTOMY Bilateral 01/27/2015   Procedure: ROBOTIC ASSISTED TOTAL HYSTERECTOMY WITH BILATERAL SALPINGO OOPHORECTOMY AND SENTINAL NODE BIOPSY;  Surgeon: Everitt Amber, MD;  Location: [facility];  Service: Gynecology;  Laterality: Bilateral;  . SKIN CANCER EXCISION     melanoma  . TONSILLECTOMY AND ADENOIDECTOMY   1952  . TRANSTHORACIC ECHOCARDIOGRAM  12/2005   EF 50-55%; mild MR; trace TR  . TUBAL LIGATION  1978  . VULVA /PERINEUM BIOPSY     PAPILLOMA    Social History   Socioeconomic History  . Marital status: Widowed    Spouse name: Not on file  . Number of children: 2  . Years of education: BSN  . Highest education level: Not on file  Occupational History  . 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 Financial resource strain: Not on file  . Food insecurity:    Worry: Not on file    Inability: Not on file  . Transportation needs:    Medical: Not on file    Non-medical: Not on file  Tobacco Use  . Smoking status: Former Smoker    Years: 10.00    Types: Cigarettes    Last attempt to quit: 05/20/1973    Years since quitting: 44.7  . Smokeless tobacco: Never Used  Substance and Sexual Activity  . Alcohol use: Yes    Alcohol/week: 4.0 standard drinks    Types: 2 Glasses of wine, 2 Cans of beer per week    Comment: 4 A WEEK  . Drug use: No  . Sexual activity: Not Currently    Birth control/protection: Post-menopausal  Lifestyle  . Physical activity:    Days per week: Not on file  Minutes per session: Not on file  . Stress: Not on file  Relationships  . Social connections:    Talks on phone: Not on file    Gets together: Not on file    Attends religious service: Not on file    Active member of club or organization: Not on file    Attends meetings of clubs or organizations: Not on file    Relationship status: Not on file  . Intimate partner violence:    Fear of current or ex partner: Not on file    Emotionally abused: Not on file    Physically abused: Not on file    Forced sexual activity: Not on file  Other Topics Concern  . Not on file  Social History Narrative   Widow, 1 adopted and 1 natural child; ([place], [place])   Live by herself      Allergies as of 01/29/2018      Reactions   Dextromethorphan    Per pt,  increases BP   Hydrocodone    Wires her up SEVERE NAUSEA   Statins    Headache, hot flashes   Morphine And Related    Could not move SEVERE NAUSEA   Tape Rash      Medication List        Accurate as of 01/29/18  7:22 PM. Always use your most recent med list.          aspirin 81 MG tablet Take 81 mg by mouth daily.   betamethasone valerate ointment 0.1 % Commonly known as:  VALISONE Use twice daily as needed   CALCIUM-VITAMIN D-VITAMIN K PO Take 1,200 mg by mouth daily.   cholecalciferol 1000 units tablet Commonly known as:  VITAMIN D Take 1,000 Units by mouth daily.   diltiazem 240 MG 24 hr capsule Commonly known as:  TIAZAC Take 1 capsule (240 mg total) by mouth daily.   ezetimibe 10 MG tablet Commonly known as:  ZETIA Take 1 tablet (10 mg total) by mouth daily.   hydrochlorothiazide 25 MG tablet Commonly known as:  HYDRODIURIL Take 1 tablet (25 mg total) by mouth daily.   HYDROcodone-homatropine 5-1.5 MG/5ML syrup Commonly known as:  HYCODAN Take 5 mLs by mouth every 8 (eight) hours as needed for up to 10 days for cough.   ibandronate 150 MG tablet Commonly known as:  BONIVA Take in the morning with a full glass of water, on an empty stomach, and do not  lie down for the next 30 min.   lansoprazole 15 MG capsule Commonly known as:  PREVACID Take 15 mg by mouth daily at 12 noon.   meloxicam 15 MG tablet Commonly known as:  MOBIC Take 1 tablet (15 mg total) by mouth daily as needed.   PRESERVISION AREDS 2 PO Take 1 capsule by mouth daily.   riboflavin 100 MG Tabs tablet Commonly known as:  VITAMIN B-2 Take 200 mg by mouth 2 (two) times daily.   telmisartan 40 MG tablet Commonly known as:  MICARDIS Take 1 tablet (40 mg total) by mouth every evening.          Objective:   Physical Exam BP (!) 124/58 (BP Location: Right Arm, Patient Position: Sitting, Cuff Size: Small)   Pulse 67   Temp 97.7 F (36.5 C) (Oral)   Resp 16   Ht 5' (1.524 m)    Wt 149 lb 2 oz (67.6 kg)   LMP 05/14/1997   SpO2 96%   BMI 29.12 kg/m  General:   Well developed, NAD, see BMI.  HEENT:  Normocephalic . Face symmetric, atraumatic Lungs:  CTA B Normal respiratory effort, no intercostal retractions, no accessory muscle use. Heart: RRR,  no murmur.  No pretibial edema bilaterally  Skin: Has very superficial varicose veins bilaterally, she is concerned about a group of varicose veins at the right calf, they are soft, nontender, very small and superficial.  No edema per se. Neurologic:  alert & oriented X3.  Speech normal, gait appropriate for age and unassisted Psych--  Cognition and judgment appear intact.  Cooperative with normal attention span and concentration.  Behavior appropriate. No anxious or depressed appearing.      Assessment & Plan:   Assessment HTN Hyperlipidemia -- statin intolerant  GERD Osteopenia --dexa 2016 (per gyn); rx boniva 05-2017 @ gyn Insomnia  MSK: ---DJD,  NSAIDs prn --Back pain, --Dr Tommie Raymond ~ 2011, saw the spine center ~ 2015 , dx w/  spinal stenosis (no MRI, clinical dx ) --2017-2018: PT, injections Dr Catha Brow Cough-- hycodan prn (tolerates well small doses hydrocodone) Oncology: --Breast cancer 2003 --Endometrial cancer, H-BSO 01/2015, will see hematology x 5 years (gyn-onc) --Melanoma, BCC (Dr Delman Cheadle) Lung nodule, 9mm, per CTs, last CT 09/30/2015 : stable  H/o SVT  HOH  PLAN: HTN: Well-controlled on diltiazem, Micardis HCTZ.  Last BMP satisfactory Hyperlipidemia: On Zetia, doing great with lifestyle, last LDL satisfactory Allergies: On Flonase and very rarely use Hycodan which is refilled today. Shoulder pain: Sees Dr. Tamera Punt, on conservative treatment, was told she has calcification of the rotator cuff.  Did some acupuncture and cupping and that seems to help as well. Preventive care: Had Shingrix # 1 @ pharmacy, #2 pending.  Plans to have a flu shot this fall. RTC 06-2017

## 2018-02-02 IMAGING — CT CT CHEST W/O CM
2 of 3 series · 15 of 36 positions shown, 18 images · non-contrast
Comparison: CT chest 02/18/2015

CLINICAL DATA: Follow-up lung nodule. History of uterine cancer,
breast cancer, melanoma

EXAM:
CT CHEST WITHOUT CONTRAST
TECHNIQUE: Multidetector CT imaging of the chest was performed following the
standard protocol without IV contrast.

[Series 2: thorax · axial · 0.80mm/px · z∈[-264,-10]mm · 12 of 151 slices shown, 15 images]
[im 12/151  mediastinal]
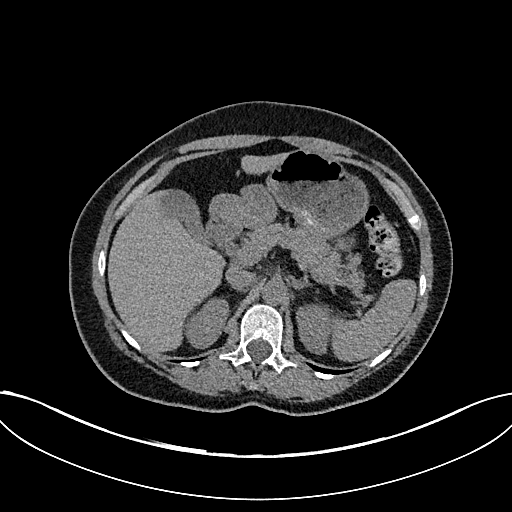
[im 12/151  lung]
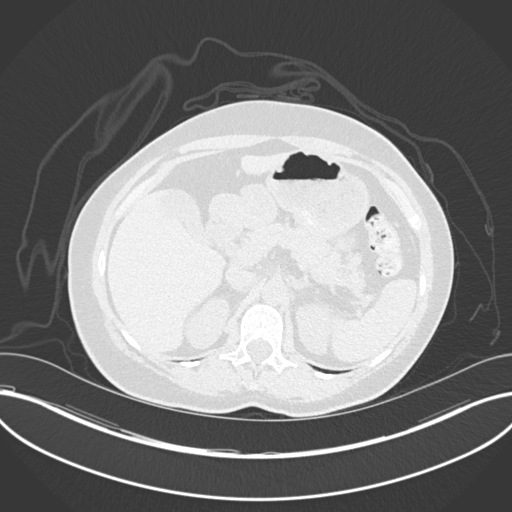
[im 23/151  lung]
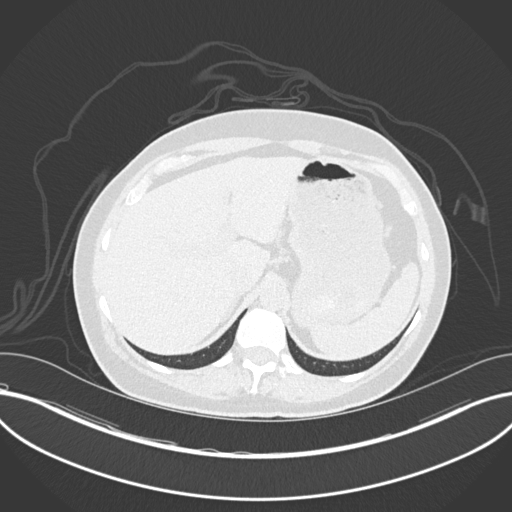
[im 34/151  lung]
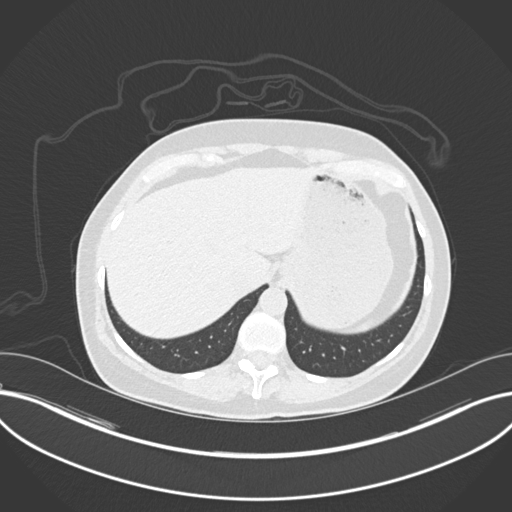
[im 45/151  lung]
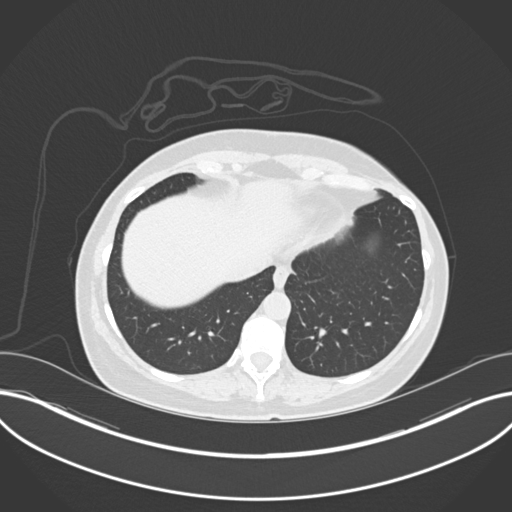
[im 56/151  mediastinal]
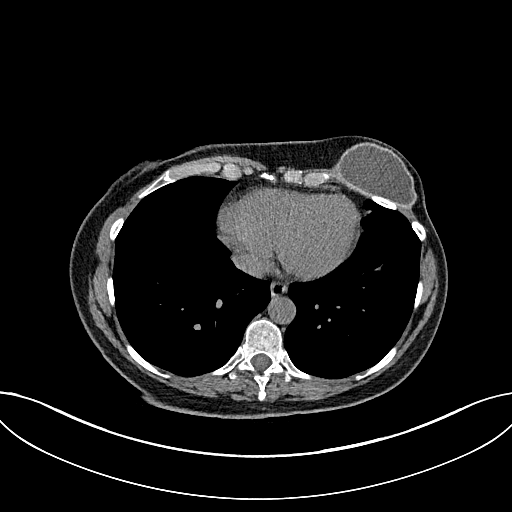
[im 56/151  lung]
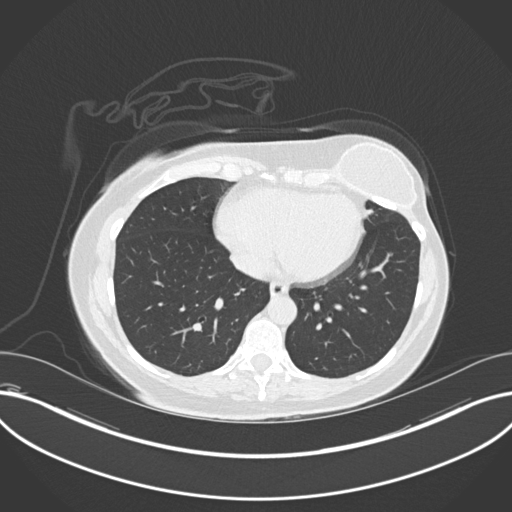
[im 67/151  lung]
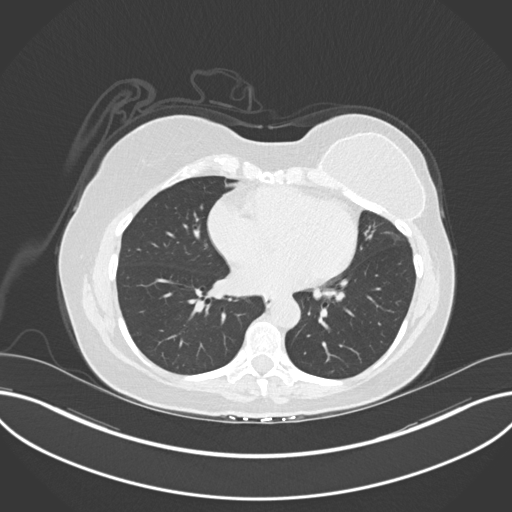
[im 84/151  lung]
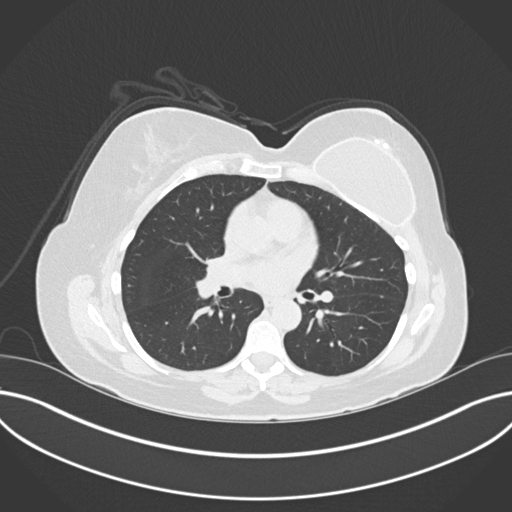
[im 95/151  lung]
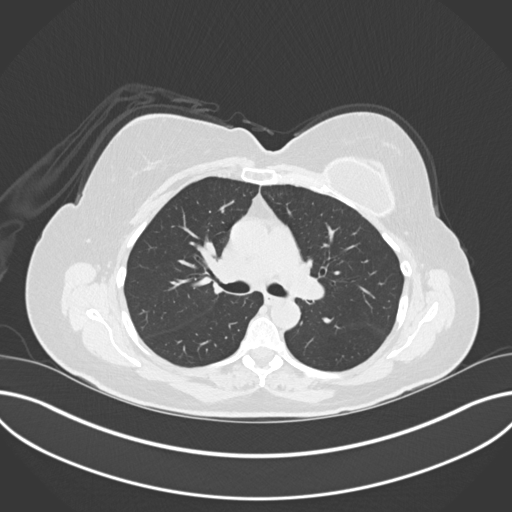
[im 106/151  mediastinal]
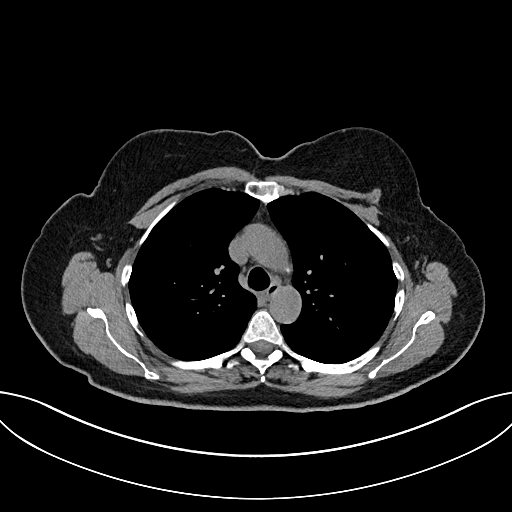
[im 106/151  lung]
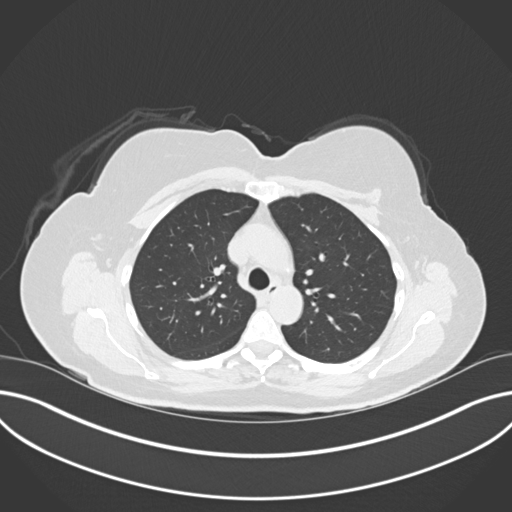
[im 117/151  lung]
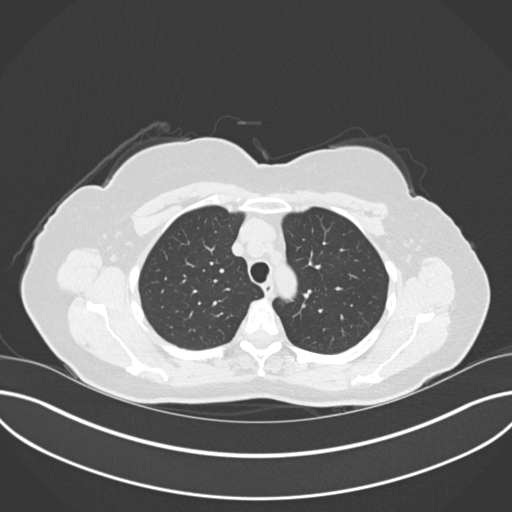
[im 128/151  lung]
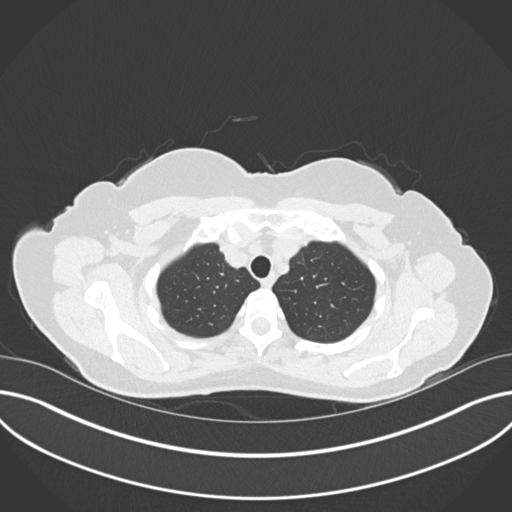
[im 139/151  lung]
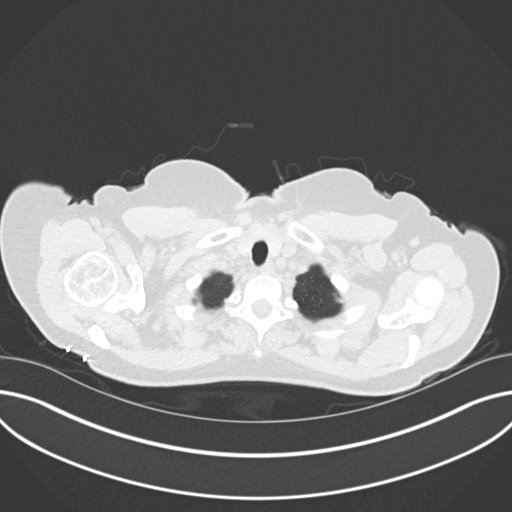

[Series 5: coronal · coronal · 0.65mm/px · 3 of 133 slices shown]
[im 27/133  lung]
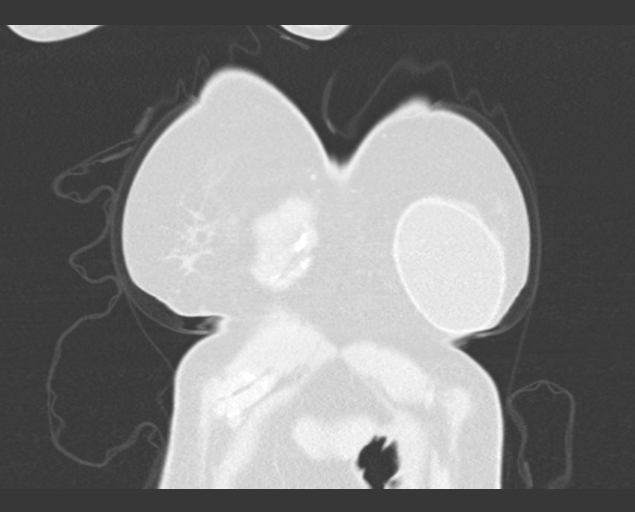
[im 53/133  lung]
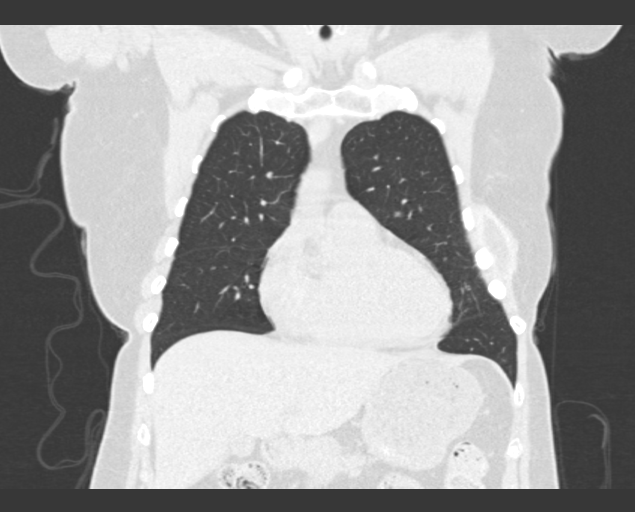
[im 80/133  lung]
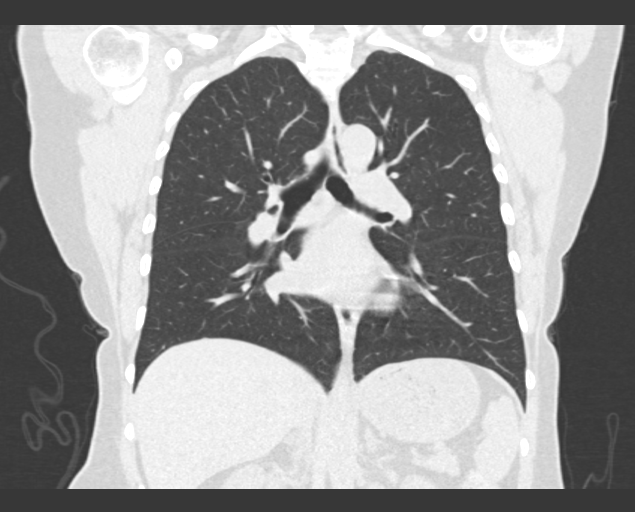

[15 of 36 positions shown; findings below may reference images not displayed]

FINDINGS: 3 mm soft tissue nodule in the right lower lobe laterally axial
image 74 is unchanged.

4 mm pleural based nodule on the right hemidiaphragm is unchanged
from the prior study.

Irregular density in the lingula is unchanged may represent
scarring.

Negative for mass or adenopathy.  Negative for pneumonia.

Negative for pleural effusion.

Heart size is normal. Calcification in the aortic valve region.
Thoracic aorta normal in caliber.

Left breast implant for reconstruction.

Upper abdomen negative.

No bony lesion.
IMPRESSION: Stable CT of the chest as above.

## 2018-02-05 ENCOUNTER — Encounter: Payer: Self-pay | Admitting: Internal Medicine

## 2018-02-17 ENCOUNTER — Ambulatory Visit: Payer: Self-pay | Admitting: Internal Medicine

## 2018-02-17 NOTE — Telephone Encounter (Signed)
Incoming call from Patient with complaint of left lower quadrant pain/ache.  States it starts at the iliac crest to the umbilicus.  Rates pain mild when not poking around.  Rates it moderate when poking around.  The onset was last weekend.  Gradual onset.  The awareness of it is intermittent. States it interferes with "nothing" States when pulling around discomfort  Is rated mild.  When poking around, rated moderate.  First time ever experiencing.  NA as far as relieving factors/aggravaring factors.  Denies other symptoms.  Provided care advice. Voiced understanding.  Scheduled appointment with Dr. Larose Kells on 02/18/18 at 2:00pm. Patient voiced understanding.       Reason for Disposition . [1] MILD pain (e.g., does not interfere with normal activities) AND [2] pain comes and goes (cramps) AND [3] present > 48 hours  Answer Assessment - Initial Assessment Questions 1. LOCATION: "Where does it hurt?"      Left lower quandrant  2. RADIATION: "Does the pain shoot anywhere else?" (e.g., chest, back)      Yes iliac crest toward umbilicus  3. ONSET: "When did the pain begin?" (e.g., minutes, hours or days ago)      Last weekend 4. SUDDEN: "Gradual or sudden onset?"     Yes until started poking around 5. PATTERN "Does the pain come and go, or is it constant?"    - If constant: "Is it getting better, staying the same, or worsening?"      (Note: Constant means the pain never goes away completely; most serious pain is constant and it progresses)     - If intermittent: "How long does it last?" "Do you have pain now?"     (Note: Intermittent means the pain goes away completely between bouts)     Its awarness of it.  interfers wiith nothing 6. SEVERITY: "How bad is the pain?"  (e.g., Scale 1-10; mild, moderate, or severe)   - MILD (1-3): doesn't interfere with normal activities, abdomen soft and not tender to touch    - MODERATE (4-7): interferes with normal activities or awakens from sleep, tender to touch    -  SEVERE (8-10): excruciating pain, doubled over, unable to do any normal activities      Pulling felling mild , poking moderate 7. RECURRENT SYMPTOM: "Have you ever had this type of abdominal pain before?" If so, ask: "When was the last time?" and "What happened that time?"      yes 8. CAUSE: "What do you think is causing the abdominal pain?"     adhesions 9. RELIEVING/AGGRAVATING FACTORS: "What makes it better or worse?" (e.g., movement, antacids, bowel movement)     no 10. OTHER SYMPTOMS: "Has there been any vomiting, diarrhea, constipation, or urine problems?"       no 11. PREGNANCY: "Is there any chance you are pregnant?" "When was your last menstrual period?"  Protocols used: ABDOMINAL PAIN - [facility]

## 2018-02-18 ENCOUNTER — Encounter: Payer: Self-pay | Admitting: Internal Medicine

## 2018-02-18 ENCOUNTER — Ambulatory Visit (INDEPENDENT_AMBULATORY_CARE_PROVIDER_SITE_OTHER): Payer: PPO | Admitting: Internal Medicine

## 2018-02-18 VITALS — BP 132/68 | HR 78 | Temp 98.0°F | Resp 16 | Ht 60.0 in | Wt 150.2 lb

## 2018-02-18 DIAGNOSIS — R1032 Left lower quadrant pain: Secondary | ICD-10-CM

## 2018-02-18 NOTE — Progress Notes (Signed)
Subjective:    Patient ID: Emily Thompson, female    DOB: 1945/07/11, 72 y.o.   MRN: [id number]  DOS:  02/18/2018 Type of visit - description : acute Interval history: Sx started 10 days ago Complaining of pain described as a pulling located at the LLQ of the abdomen. That is more noticeable when she sits down, does not change with walk, or exercise. The area seems somehow TTP "like a hit a nerve there". "Does not seem to be a pain in the gut".  In the last 4 weeks, she has been doing some abdominal exercises almost daily using a stability ball  Review of Systems No fever chills.  No rash No nausea, vomiting, diarrhea No bulging area hernia that she can say.  Past Medical History:  Diagnosis Date  . BCC (basal cell carcinoma of skin) 2013, 2016  . Breast cancer (Emily Thompson) 2003   chemo, radiation, lumpectomy, reconstructive surgery   . Complication of anesthesia   . Dyslipidemia   . Endometrial cancer (Emily Thompson)   . GERD (gastroesophageal reflux disease)   . Hypertension   . Melanoma (Emily Thompson) 2012   righ side face; basal cell (nose)   . Osteopenia   . PONV (postoperative nausea and vomiting)   . SVT (supraventricular tachycardia) ([facility])    used to see Dr Emily Thompson     Past Surgical History:  Procedure Laterality Date  . ABDOMINAL HYSTERECTOMY  2016  . BASAL CELL CARCINOMA EXCISION    . BREAST LUMPECTOMY Left 07/2001  . BREAST RECONSTRUCTION  2005  . [place]  . COLONOSCOPY W/ POLYPECTOMY  2013   NEXT DUE IN 5 YRS  . NM MYOCAR PERF WALL MOTION  01/2006   bruce myoview; no evidence of inducible ischemia, anterior wall thinning without evidence of ischemia; post-stress EF 88%; low risk scan   . NOSE SURGERY     basal cell carcinoma removed  . ROBOTIC ASSISTED TOTAL HYSTERECTOMY WITH BILATERAL SALPINGO OOPHERECTOMY Bilateral 01/27/2015   Procedure: ROBOTIC ASSISTED TOTAL HYSTERECTOMY WITH BILATERAL SALPINGO OOPHORECTOMY AND SENTINAL NODE BIOPSY;  Surgeon: Emily Amber,  MD;  Location: [facility];  Service: Gynecology;  Laterality: Bilateral;  . SKIN CANCER EXCISION     melanoma  . TONSILLECTOMY AND ADENOIDECTOMY  1952  . TRANSTHORACIC ECHOCARDIOGRAM  12/2005   EF 50-55%; mild MR; trace TR  . TUBAL LIGATION  1978  . VULVA /PERINEUM BIOPSY     PAPILLOMA    Social History   Socioeconomic History  . Marital status: Widowed    Spouse name: Not on file  . Number of children: 2  . Years of education: BSN  . Highest education level: Not on file  Occupational History  . 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 Financial resource strain: Not on file  . Food insecurity:    Worry: Not on file    Inability: Not on file  . Transportation needs:    Medical: Not on file    Non-medical: Not on file  Tobacco Use  . Smoking status: Former Smoker    Years: 10.00    Types: Cigarettes    Last attempt to quit: 05/20/1973    Years since quitting: 44.7  . Smokeless tobacco: Never Used  Substance and Sexual Activity  . Alcohol use: Yes    Alcohol/week: 4.0 standard drinks    Types: 2 Glasses of wine, 2 Cans of beer per week  Comment: 4 A WEEK  . Drug use: No  . Sexual activity: Not Currently    Birth control/protection: Post-menopausal  Lifestyle  . Physical activity:    Days per week: Not on file    Minutes per session: Not on file  . Stress: Not on file  Relationships  . Social connections:    Talks on phone: Not on file    Gets together: Not on file    Attends religious service: Not on file    Active member of club or organization: Not on file    Attends meetings of clubs or organizations: Not on file    Relationship status: Not on file  . Intimate partner violence:    Fear of current or ex partner: Not on file    Emotionally abused: Not on file    Physically abused: Not on file    Forced sexual activity: Not on file  Other Topics Concern  . Not on file  Social History Narrative   Widow, 1  adopted and 1 natural child; ([place], [place])   Live by herself      Allergies as of 02/18/2018      Reactions   Dextromethorphan    Per pt, increases BP   Hydrocodone    Wires her up SEVERE NAUSEA   Statins    Headache, hot flashes   Morphine And Related    Could not move SEVERE NAUSEA   Tape Rash      Medication List        Accurate as of 02/18/18 11:59 PM. Always use your most recent med list.          aspirin 81 MG tablet Take 81 mg by mouth daily.   betamethasone valerate ointment 0.1 % Commonly known as:  VALISONE Use twice daily as needed   CALCIUM-VITAMIN D-VITAMIN K PO Take 1,200 mg by mouth daily.   cholecalciferol 1000 units tablet Commonly known as:  VITAMIN D Take 1,000 Units by mouth daily.   diltiazem 240 MG 24 hr capsule Commonly known as:  TIAZAC Take 1 capsule (240 mg total) by mouth daily.   ezetimibe 10 MG tablet Commonly known as:  ZETIA Take 1 tablet (10 mg total) by mouth daily.   hydrochlorothiazide 25 MG tablet Commonly known as:  HYDRODIURIL Take 1 tablet (25 mg total) by mouth daily.   ibandronate 150 MG tablet Commonly known as:  BONIVA Take in the morning with a full glass of water, on an empty stomach, and do not  lie down for the next 30 min.   lansoprazole 15 MG capsule Commonly known as:  PREVACID Take 15 mg by mouth daily at 12 noon.   meloxicam 15 MG tablet Commonly known as:  MOBIC Take 1 tablet (15 mg total) by mouth daily as needed.   PRESERVISION AREDS 2 PO Take 1 capsule by mouth daily.   riboflavin 100 MG Tabs tablet Commonly known as:  VITAMIN B-2 Take 200 mg by mouth 2 (two) times daily.   telmisartan 40 MG tablet Commonly known as:  MICARDIS Take 1 tablet (40 mg total) by mouth every evening.          Objective:   Physical Exam  Abdominal:     BP 132/68 (BP Location: Left Arm, Patient Position: Sitting, Cuff Size: Small)   Pulse 78   Temp 98 F (36.7 C) (Oral)   Resp 16   Ht 5' (1.524 m)    Wt 150 lb 4 oz (68.2  kg)   LMP 05/14/1997   SpO2 98%   BMI 29.34 kg/m  General:   Well developed, NAD, see BMI.  HEENT:  Normocephalic . Face symmetric, atraumatic Abdomen:  Not distended, soft, see graphic. No inguinal hernias.  She did report some LLQ pain when she stood up from the table. MSK: Hip rotation normal, not TTP at the trochanteric versus Skin: Not pale. Not jaundice Neurologic:  alert & oriented X3.  Speech normal, gait appropriate for age and unassisted Psych--  Cognition and judgment appear intact.  Cooperative with normal attention span and concentration.  Behavior appropriate. No anxious or depressed appearing.     Assessment & Plan:   Assessment HTN Hyperlipidemia -- statin intolerant  GERD Osteopenia --dexa 2016 (per gyn); rx boniva 05-2017 @ gyn Insomnia  MSK: ---DJD,  NSAIDs prn --Back pain, --Dr Tommie Raymond ~ 2011, saw the spine center ~ 2015 , dx w/  spinal stenosis (no MRI, clinical dx ) --2017-2018: PT, injections Dr Catha Brow Cough-- hycodan prn (tolerates well small doses hydrocodone) Oncology: --Breast cancer 2003 --Endometrial cancer, H-BSO 01/2015, will see hematology x 5 years (gyn-onc) --Melanoma, BCC (Dr Delman Cheadle) Lung nodule, 6mm, per CTs, last CT 09/30/2015 : stable  H/o SVT  HOH  PLAN: Abdominal pain: As described above, seems to be more abdominal wall issue however pain is right at the UVJ  area. Plan: UCX, UA, renal ultrasound. Otherwise observation for now.

## 2018-02-18 NOTE — Patient Instructions (Signed)
GO TO THE LAB : Pick up a container to provide a urine sample

## 2018-02-18 NOTE — Progress Notes (Signed)
Pre visit review using our clinic review tool, if applicable. No additional management support is needed unless otherwise documented below in the visit note. 

## 2018-02-19 ENCOUNTER — Ambulatory Visit (HOSPITAL_BASED_OUTPATIENT_CLINIC_OR_DEPARTMENT_OTHER)
Admission: RE | Admit: 2018-02-19 | Discharge: 2018-02-19 | Disposition: A | Discharge status: Home / Self Care | Payer: PPO | Source: Ambulatory Visit | Attending: Internal Medicine | Admitting: Internal Medicine

## 2018-02-19 DIAGNOSIS — C50912 Malignant neoplasm of unspecified site of left female breast: Secondary | ICD-10-CM | POA: Diagnosis not present

## 2018-02-19 DIAGNOSIS — R1032 Left lower quadrant pain: Secondary | ICD-10-CM | POA: Insufficient documentation

## 2018-02-19 LAB — URINALYSIS, ROUTINE W REFLEX MICROSCOPIC
Bilirubin Urine: NEGATIVE
Hgb urine dipstick: NEGATIVE
Ketones, ur: NEGATIVE
Leukocytes, UA: NEGATIVE
Nitrite: NEGATIVE
Specific Gravity, Urine: 1.01 (ref 1.000–1.030)
Total Protein, Urine: NEGATIVE
Urine Glucose: NEGATIVE
Urobilinogen, UA: 0.2 (ref 0.0–1.0)
pH: 7.5 (ref 5.0–8.0)

## 2018-02-19 LAB — URINE CULTURE
MICRO NUMBER:: 91208431
Result:: NO GROWTH
SPECIMEN QUALITY:: ADEQUATE

## 2018-02-19 NOTE — Assessment & Plan Note (Signed)
Abdominal pain: As described above, seems to be more abdominal wall issue however pain is right at the UVJ  area. Plan: UCX, UA, renal ultrasound. Otherwise observation for now.

## 2018-03-04 DIAGNOSIS — C50912 Malignant neoplasm of unspecified site of left female breast: Secondary | ICD-10-CM | POA: Diagnosis not present

## 2018-03-05 ENCOUNTER — Encounter: Payer: Self-pay | Admitting: Women's Health

## 2018-03-05 ENCOUNTER — Ambulatory Visit: Payer: PPO | Admitting: Women's Health

## 2018-03-05 VITALS — BP 130/78 | Ht 60.0 in | Wt 145.0 lb

## 2018-03-05 DIAGNOSIS — Z9189 Other specified personal risk factors, not elsewhere classified: Secondary | ICD-10-CM | POA: Diagnosis not present

## 2018-03-05 DIAGNOSIS — Z01419 Encounter for gynecological examination (general) (routine) without abnormal findings: Secondary | ICD-10-CM

## 2018-03-05 DIAGNOSIS — Z9289 Personal history of other medical treatment: Secondary | ICD-10-CM | POA: Diagnosis not present

## 2018-03-05 MED ORDER — IBANDRONATE SODIUM 150 MG PO TABS: 150.0000 mg | ORAL_TABLET | ORAL | 4 refills | Status: DC

## 2018-03-05 NOTE — Patient Instructions (Signed)
Health Maintenance for Postmenopausal Women Menopause is a normal process in which your reproductive ability comes to an end. This process happens gradually over a span of months to years, usually between the ages of 22 and 9. Menopause is complete when you have missed 12 consecutive menstrual periods. It is important to talk with your health care provider about some of the most common conditions that affect postmenopausal women, such as heart disease, cancer, and bone loss (osteoporosis). Adopting a healthy lifestyle and getting preventive care can help to promote your health and wellness. Those actions can also lower your chances of developing some of these common conditions. What should I know about menopause? During menopause, you may experience a number of symptoms, such as:  Moderate-to-severe hot flashes.  Night sweats.  Decrease in sex drive.  Mood swings.  Headaches.  Tiredness.  Irritability.  Memory problems.  Insomnia.  Choosing to treat or not to treat menopausal changes is an individual decision that you make with your health care provider. What should I know about hormone replacement therapy and supplements? Hormone therapy products are effective for treating symptoms that are associated with menopause, such as hot flashes and night sweats. Hormone replacement carries certain risks, especially as you become older. If you are thinking about using estrogen or estrogen with progestin treatments, discuss the benefits and risks with your health care provider. What should I know about heart disease and stroke? Heart disease, heart attack, and stroke become more likely as you age. This may be due, in part, to the hormonal changes that your body experiences during menopause. These can affect how your body processes dietary fats, triglycerides, and cholesterol. Heart attack and stroke are both medical emergencies. There are many things that you can do to help prevent heart disease  and stroke:  Have your blood pressure checked at least every 1-2 years. High blood pressure causes heart disease and increases the risk of stroke.  If you are 53-22 years old, ask your health care provider if you should take aspirin to prevent a heart attack or a stroke.  Do not use any tobacco products, including cigarettes, chewing tobacco, or electronic cigarettes. If you need help quitting, ask your health care provider.  It is important to eat a healthy diet and maintain a healthy weight. ? Be sure to include plenty of vegetables, fruits, low-fat dairy products, and lean protein. ? Avoid eating foods that are high in solid fats, added sugars, or salt (sodium).  Get regular exercise. This is one of the most important things that you can do for your health. ? Try to exercise for at least 150 minutes each week. The type of exercise that you do should increase your heart rate and make you sweat. This is known as moderate-intensity exercise. ? Try to do strengthening exercises at least twice each week. Do these in addition to the moderate-intensity exercise.  Know your numbers.Ask your health care provider to check your cholesterol and your blood glucose. Continue to have your blood tested as directed by your health care provider.  What should I know about cancer screening? There are several types of cancer. Take the following steps to reduce your risk and to catch any cancer development as early as possible. Breast Cancer  Practice breast self-awareness. ? This means understanding how your breasts normally appear and feel. ? It also means doing regular breast self-exams. Let your health care provider know about any changes, no matter how small.  If you are 40  or older, have a clinician do a breast exam (clinical breast exam or CBE) every year. Depending on your age, family history, and medical history, it may be recommended that you also have a yearly breast X-ray (mammogram).  If you  have a family history of breast cancer, talk with your health care provider about genetic screening.  If you are at high risk for breast cancer, talk with your health care provider about having an MRI and a mammogram every year.  Breast cancer (BRCA) gene test is recommended for women who have family members with BRCA-related cancers. Results of the assessment will determine the need for genetic counseling and BRCA1 and for BRCA2 testing. BRCA-related cancers include these types: ? Breast. This occurs in males or females. ? Ovarian. ? Tubal. This may also be called fallopian tube cancer. ? Cancer of the abdominal or pelvic lining (peritoneal cancer). ? Prostate. ? Pancreatic.  Cervical, Uterine, and Ovarian Cancer Your health care provider may recommend that you be screened regularly for cancer of the pelvic organs. These include your ovaries, uterus, and vagina. This screening involves a pelvic exam, which includes checking for microscopic changes to the surface of your cervix (Pap test).  For women ages 21-65, health care providers may recommend a pelvic exam and a Pap test every three years. For women ages 79-65, they may recommend the Pap test and pelvic exam, combined with testing for human papilloma virus (HPV), every five years. Some types of HPV increase your risk of cervical cancer. Testing for HPV may also be done on women of any age who have unclear Pap test results.  Other health care providers may not recommend any screening for nonpregnant women who are considered low risk for pelvic cancer and have no symptoms. Ask your health care provider if a screening pelvic exam is right for you.  If you have had past treatment for cervical cancer or a condition that could lead to cancer, you need Pap tests and screening for cancer for at least 20 years after your treatment. If Pap tests have been discontinued for you, your risk factors (such as having a new sexual partner) need to be  reassessed to determine if you should start having screenings again. Some women have medical problems that increase the chance of getting cervical cancer. In these cases, your health care provider may recommend that you have screening and Pap tests more often.  If you have a family history of uterine cancer or ovarian cancer, talk with your health care provider about genetic screening.  If you have vaginal bleeding after reaching menopause, tell your health care provider.  There are currently no reliable tests available to screen for ovarian cancer.  Lung Cancer Lung cancer screening is recommended for adults 69-62 years old who are at high risk for lung cancer because of a history of smoking. A yearly low-dose CT scan of the lungs is recommended if you:  Currently smoke.  Have a history of at least 30 pack-years of smoking and you currently smoke or have quit within the past 15 years. A pack-year is smoking an average of one pack of cigarettes per day for one year.  Yearly screening should:  Continue until it has been 15 years since you quit.  Stop if you develop a health problem that would prevent you from having lung cancer treatment.  Colorectal Cancer  This type of cancer can be detected and can often be prevented.  Routine colorectal cancer screening usually begins at  age 42 and continues through age 45.  If you have risk factors for colon cancer, your health care provider may recommend that you be screened at an earlier age.  If you have a family history of colorectal cancer, talk with your health care provider about genetic screening.  Your health care provider may also recommend using home test kits to check for hidden blood in your stool.  A small camera at the end of a tube can be used to examine your colon directly (sigmoidoscopy or colonoscopy). This is done to check for the earliest forms of colorectal cancer.  Direct examination of the colon should be repeated every  5-10 years until age 71. However, if early forms of precancerous polyps or small growths are found or if you have a family history or genetic risk for colorectal cancer, you may need to be screened more often.  Skin Cancer  Check your skin from head to toe regularly.  Monitor any moles. Be sure to tell your health care provider: ? About any new moles or changes in moles, especially if there is a change in a mole's shape or color. ? If you have a mole that is larger than the size of a pencil eraser.  If any of your family members has a history of skin cancer, especially at a Vaudine Dutan age, talk with your health care provider about genetic screening.  Always use sunscreen. Apply sunscreen liberally and repeatedly throughout the day.  Whenever you are outside, protect yourself by wearing long sleeves, pants, a wide-brimmed hat, and sunglasses.  What should I know about osteoporosis? Osteoporosis is a condition in which bone destruction happens more quickly than new bone creation. After menopause, you may be at an increased risk for osteoporosis. To help prevent osteoporosis or the bone fractures that can happen because of osteoporosis, the following is recommended:  If you are 46-71 years old, get at least 1,000 mg of calcium and at least 600 mg of vitamin D per day.  If you are older than age 55 but younger than age 65, get at least 1,200 mg of calcium and at least 600 mg of vitamin D per day.  If you are older than age 54, get at least 1,200 mg of calcium and at least 800 mg of vitamin D per day.  Smoking and excessive alcohol intake increase the risk of osteoporosis. Eat foods that are rich in calcium and vitamin D, and do weight-bearing exercises several times each week as directed by your health care provider. What should I know about how menopause affects my mental health? Depression may occur at any age, but it is more common as you become older. Common symptoms of depression  include:  Low or sad mood.  Changes in sleep patterns.  Changes in appetite or eating patterns.  Feeling an overall lack of motivation or enjoyment of activities that you previously enjoyed.  Frequent crying spells.  Talk with your health care provider if you think that you are experiencing depression. What should I know about immunizations? It is important that you get and maintain your immunizations. These include:  Tetanus, diphtheria, and pertussis (Tdap) booster vaccine.  Influenza every year before the flu season begins.  Pneumonia vaccine.  Shingles vaccine.  Your health care provider may also recommend other immunizations. This information is not intended to replace advice given to you by your health care provider. Make sure you discuss any questions you have with your health care provider. Document Released: 06/22/2005  Document Revised: 11/18/2015 Document Reviewed: 02/01/2015 Elsevier Interactive Patient Education  2018 [employer]vier Inc[employer]  

## 2018-03-05 NOTE — Addendum Note (Signed)
Addended by: Lorine Bears on: 03/05/2018 03:03 PM   Modules accepted: Orders

## 2018-03-05 NOTE — Progress Notes (Signed)
Emily Thompson Jun 19, 1945 [id number]    History:    Presents for breast and pelvic exam.  01/2015 endometrial cancer TVH with BSO with negative lymph nodes.  Normal Pap history.  2003 left breast cancer.  2018 normal/negative colonoscopy.  2018-2.4 FRAX 14% / 3.42% on Boniva 150 mg monthly for 1 year.Marland Kitchen  History of melanoma on her face has annual skin checks.  Primary care manages hypertension, hypercholesteremia and long-term partner not sexually active his health.  Vaccines current.  Past medical history, past surgical history, family history and social history were all reviewed and documented in the EPIC chart.  Retired Marine scientist.  ROS:  A ROS was performed and pertinent positives and negatives are included.  Exam:  Vitals:   03/05/18 1403  BP: 130/78  Weight: 145 lb (65.8 kg)  Height: 5' (1.524 m)   Body mass index is 28.32 kg/m.   General appearance:  Normal Thyroid:  Symmetrical, normal in size, without palpable masses or nodularity. Respiratory  Auscultation:  Clear without wheezing or rhonchi Cardiovascular  Auscultation:  Regular rate, without rubs, murmurs or gallops  Edema/varicosities:  Not grossly evident Abdominal  Soft,nontender, without masses, guarding or rebound.  Liver/spleen:  No organomegaly noted  Hernia:  None appreciated  Skin  Inspection:  Grossly normal   Breasts: Examined lying and sitting.     Right: Without masses, retractions, discharge or axillary adenopathy.     Left: Well-healed lumpectomy no discharge or axillary adenopathy. Gentitourinary   Inguinal/mons:  Normal without inguinal adenopathy  External genitalia:  Normal  BUS/Urethra/Skene's glands:  Normal  Vagina:  Normal  Cervix: And uterus absent  Adnexa/parametria:     Rt: Without masses or tenderness.   Lt: Without masses or tenderness.  Anus and perineum: Normal  Digital rectal exam: Normal sphincter tone without palpated masses or tenderness  Assessment/Plan:  72 y.o. S WF G1,  P1 +1 adopted for breast and pelvic exam with complaint of left lower quadrant pulling sensation especially in a.m.  No changes in bowel or bladder, nausea, vomiting or diarrhea, discomfort decreases as the day progresses.  Has follow-up with primary care.   01/2015 endometrial cancer TVH with BSO negative lymph nodes 2003 left breast cancer lumpectomy, chemotherapy and radiation Osteopenia with elevated FRAX on Boniva x1 year Melanoma face -  annual skin checks   Plan: Pap, PEs, continue annual 3D screening mammogram, calcium rich foods, vitamin D 2000 daily encouraged.  Reviewed importance of weightbearing and balance type exercise, currently exercising most days of the week and doing yoga.  Home safety, fall prevention discussed.  Boniva 150 mg p.o. monthly, prescription, proper use given and reviewed.  Tolerating well.  Reviewed possibility of scar tissue causing left lower quadrant pain will follow up with primary care proceed with CT scan if discomfort persist.  [name] [facility], 2:42 PM 03/05/2018

## 2018-03-06 LAB — PAP IG W/ RFLX HPV ASCU

## 2018-03-10 ENCOUNTER — Encounter: Payer: Self-pay | Admitting: Internal Medicine

## 2018-03-16 ENCOUNTER — Other Ambulatory Visit: Payer: Self-pay | Admitting: Internal Medicine

## 2018-04-18 DIAGNOSIS — H43812 Vitreous degeneration, left eye: Secondary | ICD-10-CM | POA: Diagnosis not present

## 2018-04-30 ENCOUNTER — Other Ambulatory Visit: Payer: Self-pay | Admitting: Internal Medicine

## 2018-05-24 ENCOUNTER — Other Ambulatory Visit: Payer: Self-pay | Admitting: Internal Medicine

## 2018-06-10 ENCOUNTER — Other Ambulatory Visit: Payer: Self-pay | Admitting: Internal Medicine

## 2018-06-19 ENCOUNTER — Telehealth: Payer: Self-pay | Admitting: *Deleted

## 2018-06-19 NOTE — Telephone Encounter (Signed)
Patient called back and scheduled her follow up for 5/15 at 1:15pm

## 2018-06-19 NOTE — Telephone Encounter (Signed)
Attempted to return the patient's call regarding scheduling a follow up appt. Left a message for the patient to call the office

## 2018-07-10 ENCOUNTER — Encounter: Payer: Self-pay | Admitting: Internal Medicine

## 2018-07-11 MED ORDER — TELMISARTAN 40 MG PO TABS: 40.0000 mg | ORAL_TABLET | Freq: Every evening | ORAL | 2 refills | Status: DC

## 2018-07-11 MED ORDER — HYDROCHLOROTHIAZIDE 25 MG PO TABS: 25.0000 mg | ORAL_TABLET | Freq: Every day | ORAL | 1 refills | Status: DC

## 2018-07-20 ENCOUNTER — Encounter: Payer: Self-pay | Admitting: Internal Medicine

## 2018-07-24 ENCOUNTER — Encounter: Payer: Self-pay | Admitting: Internal Medicine

## 2018-07-30 ENCOUNTER — Ambulatory Visit: Payer: PPO | Admitting: Internal Medicine

## 2018-07-31 ENCOUNTER — Encounter: Payer: Self-pay | Admitting: Internal Medicine

## 2018-07-31 MED ORDER — HYDROCHLOROTHIAZIDE 25 MG PO TABS: 25.0000 mg | ORAL_TABLET | Freq: Every day | ORAL | 3 refills | Status: DC

## 2018-07-31 MED ORDER — IBANDRONATE SODIUM 150 MG PO TABS: 150.0000 mg | ORAL_TABLET | ORAL | 4 refills | Status: DC

## 2018-07-31 MED ORDER — DILTIAZEM HCL ER BEADS 240 MG PO CP24: 240.0000 mg | ORAL_CAPSULE | Freq: Every day | ORAL | 3 refills | Status: DC

## 2018-07-31 MED ORDER — TELMISARTAN 40 MG PO TABS: 40.0000 mg | ORAL_TABLET | Freq: Every evening | ORAL | 3 refills | Status: DC

## 2018-07-31 MED ORDER — EZETIMIBE 10 MG PO TABS: 10.0000 mg | ORAL_TABLET | Freq: Every day | ORAL | 3 refills | Status: DC

## 2018-08-12 ENCOUNTER — Encounter: Payer: Self-pay | Admitting: Internal Medicine

## 2018-08-12 ENCOUNTER — Other Ambulatory Visit: Payer: Self-pay

## 2018-08-12 MED ORDER — DILTIAZEM HCL ER BEADS 240 MG PO CP24: 240.0000 mg | ORAL_CAPSULE | Freq: Every day | ORAL | 3 refills | Status: DC

## 2018-08-12 MED ORDER — HYDROCHLOROTHIAZIDE 25 MG PO TABS: 25.0000 mg | ORAL_TABLET | Freq: Every day | ORAL | 3 refills | Status: DC

## 2018-08-18 ENCOUNTER — Ambulatory Visit (INDEPENDENT_AMBULATORY_CARE_PROVIDER_SITE_OTHER): Payer: PPO | Admitting: Internal Medicine

## 2018-08-18 ENCOUNTER — Other Ambulatory Visit: Payer: Self-pay

## 2018-08-18 DIAGNOSIS — E785 Hyperlipidemia, unspecified: Secondary | ICD-10-CM | POA: Diagnosis not present

## 2018-08-18 DIAGNOSIS — I10 Essential (primary) hypertension: Secondary | ICD-10-CM

## 2018-08-18 DIAGNOSIS — F419 Anxiety disorder, unspecified: Secondary | ICD-10-CM | POA: Diagnosis not present

## 2018-08-18 NOTE — Progress Notes (Signed)
Subjective:    Patient ID: Emily Thompson, female    DOB: 1945/12/26, 73 y.o.   MRN: [id number]  DOS:  08/18/2018 Type of visit - description: Virtual Visit via Video Note  I connected with@ on 08/19/18 at  8:20 AM EDT by a video enabled telemedicine application and verified that I am speaking with the correct person using two identifiers.   THIS ENCOUNTER IS A VIRTUAL VISIT DUE TO COVID-19 - PATIENT WAS NOT SEEN IN THE OFFICE. PATIENT HAS CONSENTED TO VIRTUAL VISIT / TELEMEDICINE VISIT   Location of patient: home  Location of provider: office  I discussed the limitations of evaluation and management by telemedicine and the availability of in person appointments. The patient expressed understanding and agreed to proceed.  History of Present Illness: Routine office visit We reviewed togetherher previous results and medication list.  Good compliance. In general feeling well, she is obviously anxious about the coronavirus situation.   Review of Systems Denies fever chills She does check her temperature. No chest pain, difficulty breathing or cough No nausea, vomiting, diarrhea. Allergies as usual are bother her at this time of the year, on Flonase.  Past Medical History:  Diagnosis Date  . BCC (basal cell carcinoma of skin) 2013, 2016  . Breast cancer ([place]) 2003   chemo, radiation, lumpectomy, reconstructive surgery   . Complication of anesthesia   . Dyslipidemia   . Endometrial cancer ([place])   . GERD (gastroesophageal reflux disease)   . Hypertension   . Melanoma ([place]) 2012   righ side face; basal cell (nose)   . Osteopenia   . PONV (postoperative nausea and vomiting)   . SVT (supraventricular tachycardia) (HCC)    used to see Dr Rex Kras     Past Surgical History:  Procedure Laterality Date  . ABDOMINAL HYSTERECTOMY  2016  . BASAL CELL CARCINOMA EXCISION    . BREAST LUMPECTOMY Left 07/2001  . BREAST RECONSTRUCTION  2005  . [place]  . COLONOSCOPY  W/ POLYPECTOMY  2013   NEXT DUE IN 5 YRS  . NM MYOCAR PERF WALL MOTION  01/2006   bruce myoview; no evidence of inducible ischemia, anterior wall thinning without evidence of ischemia; post-stress EF 88%; low risk scan   . NOSE SURGERY     basal cell carcinoma removed  . ROBOTIC ASSISTED TOTAL HYSTERECTOMY WITH BILATERAL SALPINGO OOPHERECTOMY Bilateral 01/27/2015   Procedure: ROBOTIC ASSISTED TOTAL HYSTERECTOMY WITH BILATERAL SALPINGO OOPHORECTOMY AND SENTINAL NODE BIOPSY;  Surgeon: Everitt Amber, MD;  Location: [facility];  Service: Gynecology;  Laterality: Bilateral;  . SKIN CANCER EXCISION     melanoma  . TONSILLECTOMY AND ADENOIDECTOMY  1952  . TRANSTHORACIC ECHOCARDIOGRAM  12/2005   EF 50-55%; mild MR; trace TR  . TUBAL LIGATION  1978  . VULVA /PERINEUM BIOPSY     PAPILLOMA    Social History   Socioeconomic History  . Marital status: Widowed    Spouse name: Not on file  . Number of children: 2  . Years of education: BSN  . Highest education level: Not on file  Occupational History  . 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 Financial resource strain: Not on file  . Food insecurity:    Worry: Not on file    Inability: Not on file  . Transportation needs:    Medical: Not on file    Non-medical: Not on file  Tobacco Use  .  Smoking status: Former Smoker    Years: 10.00    Types: Cigarettes    Last attempt to quit: 05/20/1973    Years since quitting: 45.2  . Smokeless tobacco: Never Used  Substance and Sexual Activity  . Alcohol use: Yes    Alcohol/week: 4.0 standard drinks    Types: 2 Glasses of wine, 2 Cans of beer per week    Comment: 4 A WEEK  . Drug use: No  . Sexual activity: Not Currently    Birth control/protection: Post-menopausal  Lifestyle  . Physical activity:    Days per week: Not on file    Minutes per session: Not on file  . Stress: Not on file  Relationships  . Social connections:    Talks on phone:  Not on file    Gets together: Not on file    Attends religious service: Not on file    Active member of club or organization: Not on file    Attends meetings of clubs or organizations: Not on file    Relationship status: Not on file  . Intimate partner violence:    Fear of current or ex partner: Not on file    Emotionally abused: Not on file    Physically abused: Not on file    Forced sexual activity: Not on file  Other Topics Concern  . Not on file  Social History Narrative   Widow, 1 adopted and 1 natural child; ([place], [place])   Live by herself      Allergies as of 08/18/2018      Reactions   Dextromethorphan    Per pt, increases BP   Hydrocodone    Wires her up SEVERE NAUSEA   Statins    Headache, hot flashes   Morphine And Related    Could not move SEVERE NAUSEA   Tape Rash      Medication List       Accurate as of August 18, 2018 11:59 PM. Always use your most recent med list.        aspirin 81 MG tablet Take 81 mg by mouth daily.   betamethasone valerate ointment 0.1 % Commonly known as:  VALISONE Use twice daily as needed   CALCIUM-VITAMIN D-VITAMIN K PO Take 1,200 mg by mouth daily.   cholecalciferol 1000 units tablet Commonly known as:  VITAMIN D Take 1,000 Units by mouth daily.   diltiazem 240 MG 24 hr capsule Commonly known as:  TIAZAC Take 1 capsule (240 mg total) by mouth daily.   ezetimibe 10 MG tablet Commonly known as:  ZETIA Take 1 tablet (10 mg total) by mouth daily.   hydrochlorothiazide 25 MG tablet Commonly known as:  HYDRODIURIL Take 1 tablet (25 mg total) by mouth daily.   ibandronate 150 MG tablet Commonly known as:  Boniva Take 1 tablet (150 mg total) by mouth every 30 (thirty) days. Take in am with a full glass of water,  do not  lie down for the next 30 min.   lansoprazole 15 MG capsule Commonly known as:  PREVACID Take 15 mg by mouth daily at 12 noon.   meloxicam 15 MG tablet Commonly known as:  MOBIC Take 1 tablet  (15 mg total) by mouth daily as needed for pain.   PRESERVISION AREDS 2 PO Take 1 capsule by mouth daily.   telmisartan 40 MG tablet Commonly known as:  MICARDIS Take 1 tablet (40 mg total) by mouth every evening.  Objective:   Physical Exam LMP 05/14/1997  This  Is a video conference, she is alert oriented x3 in no distress    Assessment     Assessment HTN Hyperlipidemia -- statin intolerant  GERD Osteopenia --dexa 2016 (per gyn); rx boniva 05-2017 @ gyn Insomnia  MSK: ---DJD,  NSAIDs prn --Back pain, --Dr Tommie Raymond ~ 2011, saw the spine center ~ 2015 , dx w/  spinal stenosis (no MRI, clinical dx ) --2017-2018: PT, injections Dr Catha Brow Cough-- hycodan prn (tolerates well small doses hydrocodone) Oncology: --Breast cancer 2003 --Endometrial cancer, H-BSO 01/2015, will see hematology x 5 years (gyn-onc) --Melanoma, BCC (Dr Delman Cheadle) Lung nodule, 20mm, per CTs, last CT 09/30/2015 : stable  H/o SVT  HOH  PLAN: Virtual visit, video HTN: Currently on diltiazem, HCTZ, Micardis.  Ambulatory BPs usually very good in the 120s.  One time when she was anxious it was 140/80.  Will continue present care Hyperlipidemia: On Zetia, due for labs Osteopenia: Good compliance with calcium, vitamin D, Boniva. Anxiety: Related to coronavirus pandemia, we discussed social distancing, appropriate hygiene, using a mask, etc.. For treatment, she currently does not need daily medication, in the past she took a very low-dose of Xanax and that is a consideration if needed. Under ideal circumstances I will get a CMP, CBC and FLP but at this point I prefer her to stay safe at home RTC 3 to 4 months, patient will call for an appointment.    I discussed the assessment and treatment plan with the patient. The patient was provided an opportunity to ask questions and all were answered. The patient agreed with the plan and demonstrated an understanding of the instructions.   The patient was  advised to call back or seek an in-person evaluation if the symptoms worsen or if the condition fails to improve as anticipated.

## 2018-08-19 NOTE — Assessment & Plan Note (Signed)
Virtual visit, video HTN: Currently on diltiazem, HCTZ, Micardis.  Ambulatory BPs usually very good in the 120s.  One time when she was anxious it was 140/80.  Will continue present care Hyperlipidemia: On Zetia, due for labs Osteopenia: Good compliance with calcium, vitamin D, Boniva. Anxiety: Related to coronavirus pandemia, we discussed social distancing, appropriate hygiene, using a mask, etc.. For treatment, she currently does not need daily medication, in the past she took a very low-dose of Xanax and that is a consideration if needed. Under ideal circumstances I will get a CMP, CBC and FLP but at this point I prefer her to stay safe at home RTC 3 to 4 months, patient will call for an appointment.

## 2018-09-17 ENCOUNTER — Telehealth: Payer: Self-pay | Admitting: *Deleted

## 2018-09-17 NOTE — Telephone Encounter (Signed)
Called and left a message for the patient to call the office back. Need to change her appt on 5/15 to a virtual visit

## 2018-09-17 NOTE — Telephone Encounter (Signed)
Patient called back and changed her appt to a WebEx

## 2018-09-26 ENCOUNTER — Inpatient Hospital Stay: Payer: PPO | Attending: Gynecologic Oncology | Admitting: Gynecologic Oncology

## 2018-09-26 ENCOUNTER — Encounter: Payer: Self-pay | Admitting: Gynecologic Oncology

## 2018-09-26 DIAGNOSIS — Z90722 Acquired absence of ovaries, bilateral: Secondary | ICD-10-CM

## 2018-09-26 DIAGNOSIS — Z90710 Acquired absence of both cervix and uterus: Secondary | ICD-10-CM

## 2018-09-26 DIAGNOSIS — C541 Malignant neoplasm of endometrium: Secondary | ICD-10-CM

## 2018-09-26 NOTE — Progress Notes (Signed)
Gynecologic Oncology Telehealth Consult Note: Gyn-Onc  I connected with Emily Thompson on 09/26/18 at  1:15 PM EDT by telephone and verified that I am speaking with the correct person using two identifiers.  I discussed the limitations, risks, security and privacy concerns of performing an evaluation and management service by telemedicine and the availability of in-person appointments. I also discussed with the patient that there may be a patient responsible charge related to this service. The patient expressed understanding and agreed to proceed.  Other persons participating in the visit and their role in the encounter: none.  Patient's location: home Provider's location: [place]  Chief Complaint:  Chief Complaint  Patient presents with  . endometrial cancer    Assessment:    73 y.o. year old with Stage IA Grade 1 endometrioid endometrial cancer.   S/p robotic hysterectomy, BSO, sentinel lymph node biopsy on 01/27/15. no LVSI, 40% myometrial invasion, negative pelvic washings and negative lymph nodes. Low risk factors for recurrence.  Plan: 1) No evidence of disease on today's visit.  2)  Return to clinic in 6 months to see Elon Alas and in 1 year to see me.  HPI:  Emily Thompson is a 73 y.o. year old G1P1001 initially seen in consultation on 01/14/15 referred by Dr Toney Rakes for grade 1 endometrial cancer.  She then underwent a robotic hysterectomy, BSO and sentinel lymph node biopsy on 07/14/18 without complications.  Her postoperative course was uncomplicated.  Her final pathologic diagnosis is a Stage IA Grade 1 endometrioid endometrial cancer with 40% lymphovascular space invasion, 6/15 mm (40%) of myometrial invasion and negative lymph nodes.  Interval Hx:  She is doing well with no complaints or symptoms concerning for endometrial cancer recurrence.  Not sexually active.  Left lower quadrant pain summer of 2019. Pelvic ultrasound negative. Pain has now  resolved.   Past Medical History:  Diagnosis Date  . BCC (basal cell carcinoma of skin) 2013, 2016  . Breast cancer ([place]) 2003   chemo, radiation, lumpectomy, reconstructive surgery   . Complication of anesthesia   . Dyslipidemia   . Endometrial cancer ([place])   . GERD (gastroesophageal reflux disease)   . Hypertension   . Melanoma ([place]) 2012   righ side face; basal cell (nose)   . Osteopenia   . PONV (postoperative nausea and vomiting)   . SVT (supraventricular tachycardia) ([facility])    used to see Dr Rex Kras    Past Surgical History:  Procedure Laterality Date  . ABDOMINAL HYSTERECTOMY  2016  . BASAL CELL CARCINOMA EXCISION    . BREAST LUMPECTOMY Left 07/2001  . BREAST RECONSTRUCTION  2005  . [place]  . COLONOSCOPY W/ POLYPECTOMY  2013   NEXT DUE IN 5 YRS  . NM MYOCAR PERF WALL MOTION  01/2006   bruce myoview; no evidence of inducible ischemia, anterior wall thinning without evidence of ischemia; post-stress EF 88%; low risk scan   . NOSE SURGERY     basal cell carcinoma removed  . ROBOTIC ASSISTED TOTAL HYSTERECTOMY WITH BILATERAL SALPINGO OOPHERECTOMY Bilateral 01/27/2015   Procedure: ROBOTIC ASSISTED TOTAL HYSTERECTOMY WITH BILATERAL SALPINGO OOPHORECTOMY AND SENTINAL NODE BIOPSY;  Surgeon: Everitt Amber, MD;  Location: [facility];  Service: Gynecology;  Laterality: Bilateral;  . SKIN CANCER EXCISION     melanoma  . TONSILLECTOMY AND ADENOIDECTOMY  1952  . TRANSTHORACIC ECHOCARDIOGRAM  12/2005   EF 50-55%; mild MR; trace TR  . TUBAL LIGATION  1978  .  VULVA /PERINEUM BIOPSY     PAPILLOMA   Family History  Problem Relation Age of Onset  . Hypertension Mother   . Thyroid disease Mother        M and B (?)  . Hypertension Father   . Stroke Father        hemorrhagic CVA at age 19  . Bipolar disorder Brother   . Diabetes Brother   . Colon cancer Maternal Aunt        dx at age 63s  . Breast cancer Neg Hx    Social History   Socioeconomic History  . Marital  status: Widowed    Spouse name: Not on file  . Number of children: 2  . Years of education: BSN  . Highest education level: Not on file  Occupational History  . 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 Financial resource strain: Not on file  . Food insecurity:    Worry: Not on file    Inability: Not on file  . Transportation needs:    Medical: Not on file    Non-medical: Not on file  Tobacco Use  . Smoking status: Former Smoker    Years: 10.00    Types: Cigarettes    Last attempt to quit: 05/20/1973    Years since quitting: 45.3  . Smokeless tobacco: Never Used  Substance and Sexual Activity  . Alcohol use: Yes    Alcohol/week: 4.0 standard drinks    Types: 2 Glasses of wine, 2 Cans of beer per week    Comment: 4 A WEEK  . Drug use: No  . Sexual activity: Not Currently    Birth control/protection: Post-menopausal  Lifestyle  . Physical activity:    Days per week: Not on file    Minutes per session: Not on file  . Stress: Not on file  Relationships  . Social connections:    Talks on phone: Not on file    Gets together: Not on file    Attends religious service: Not on file    Active member of club or organization: Not on file    Attends meetings of clubs or organizations: Not on file    Relationship status: Not on file  . Intimate partner violence:    Fear of current or ex partner: Not on file    Emotionally abused: Not on file    Physically abused: Not on file    Forced sexual activity: Not on file  Other Topics Concern  . Not on file  Social History Narrative   Widow, 1 adopted and 1 natural child; ([place], [place])   Live by herself   Allergies  Allergen Reactions  . Dextromethorphan     Per pt, increases BP  . Hydrocodone     Wires her up SEVERE NAUSEA  . Statins     Headache, hot flashes  . Morphine And Related     Could not move SEVERE NAUSEA  . Tape Rash   Current Outpatient Medications on File  Prior to Visit  Medication Sig Dispense Refill  . aspirin 81 MG tablet Take 81 mg by mouth daily.     . betamethasone valerate ointment (VALISONE) 0.1 % Use twice daily as needed 30 g 0  . CALCIUM-VITAMIN D-VITAMIN K PO Take 1,200 mg by mouth daily.    . cholecalciferol (VITAMIN D) 1000 units tablet Take 1,000 Units by mouth daily.    Marland Kitchen diltiazem (  TIAZAC) 240 MG 24 hr capsule Take 1 capsule (240 mg total) by mouth daily. 90 capsule 3  . ezetimibe (ZETIA) 10 MG tablet Take 1 tablet (10 mg total) by mouth daily. 90 tablet 3  . hydrochlorothiazide (HYDRODIURIL) 25 MG tablet Take 1 tablet (25 mg total) by mouth daily. 90 tablet 3  . ibandronate (BONIVA) 150 MG tablet Take 1 tablet (150 mg total) by mouth every 30 (thirty) days. Take in am with a full glass of water,  do not  lie down for the next 30 min. 3 tablet 4  . lansoprazole (PREVACID) 15 MG capsule Take 15 mg by mouth daily at 12 noon.    . meloxicam (MOBIC) 15 MG tablet Take 1 tablet (15 mg total) by mouth daily as needed for pain. 90 tablet 0  . Multiple Vitamins-Minerals (PRESERVISION AREDS 2 PO) Take 1 capsule by mouth daily.    Marland Kitchen telmisartan (MICARDIS) 40 MG tablet Take 1 tablet (40 mg total) by mouth every evening. 90 tablet 3   No current facility-administered medications on file prior to visit.      Review of systems: Constitutional:  She has no weight gain or weight loss. She has no fever or chills. Eyes: No blurred vision Ears, Nose, Mouth, Throat: No dizziness, headaches or changes in hearing. No mouth sores. Cardiovascular: No chest pain, palpitations or edema. Respiratory:  No shortness of breath, wheezing or cough Gastrointestinal: She has normal bowel movements without diarrhea or constipation. She denies any nausea or vomiting. She denies blood in her stool or heart burn. Genitourinary:  She denies pelvic pain, pelvic pressure or changes in her urinary function. She has no hematuria, dysuria, or incontinence. She has no  irregular vaginal bleeding or vaginal discharge Musculoskeletal: Denies muscle weakness or joint pains.  Skin:  She has no skin changes, rashes or itching Neurological:  Denies dizziness or headaches. No neuropathy, no numbness or tingling. Psychiatric:  She denies depression or anxiety. Hematologic/Lymphatic:   No easy bruising or bleeding   Physical Exam: Deferred due to telehealth visit.  I discussed the assessment and treatment plan with the patient. The patient was provided with an opportunity to ask questions and all were answered. The patient agreed with the plan and demonstrated an understanding of the instructions.   The patient was advised to call back or see an in-person evaluation if the symptoms worsen or if the condition fails to improve as anticipated.   I provided 15 minutes of face-to-face video visit time during this encounter, and > 50% was spent counseling as documented under my assessment & plan.     Thereasa Solo, MD

## 2018-09-26 NOTE — Patient Instructions (Signed)
Please contact Dr Denman George in the new year to schedule a follow-up appointment with her for May, 2021.

## 2018-10-08 ENCOUNTER — Encounter: Payer: Self-pay | Admitting: Internal Medicine

## 2018-12-22 DIAGNOSIS — Z1231 Encounter for screening mammogram for malignant neoplasm of breast: Secondary | ICD-10-CM | POA: Diagnosis not present

## 2018-12-22 DIAGNOSIS — Z853 Personal history of malignant neoplasm of breast: Secondary | ICD-10-CM | POA: Diagnosis not present

## 2018-12-22 LAB — HM MAMMOGRAPHY

## 2019-01-07 ENCOUNTER — Encounter: Payer: Self-pay | Admitting: Internal Medicine

## 2019-01-09 ENCOUNTER — Ambulatory Visit: Payer: PPO | Admitting: Internal Medicine

## 2019-01-14 DIAGNOSIS — L570 Actinic keratosis: Secondary | ICD-10-CM | POA: Diagnosis not present

## 2019-01-14 DIAGNOSIS — L738 Other specified follicular disorders: Secondary | ICD-10-CM | POA: Diagnosis not present

## 2019-01-14 DIAGNOSIS — Z85828 Personal history of other malignant neoplasm of skin: Secondary | ICD-10-CM | POA: Diagnosis not present

## 2019-01-14 DIAGNOSIS — L821 Other seborrheic keratosis: Secondary | ICD-10-CM | POA: Diagnosis not present

## 2019-01-14 DIAGNOSIS — D485 Neoplasm of uncertain behavior of skin: Secondary | ICD-10-CM | POA: Diagnosis not present

## 2019-01-14 DIAGNOSIS — Z86006 Personal history of melanoma in-situ: Secondary | ICD-10-CM | POA: Diagnosis not present

## 2019-01-14 DIAGNOSIS — C44329 Squamous cell carcinoma of skin of other parts of face: Secondary | ICD-10-CM | POA: Diagnosis not present

## 2019-01-21 ENCOUNTER — Other Ambulatory Visit: Payer: Self-pay

## 2019-01-21 ENCOUNTER — Encounter: Payer: Self-pay | Admitting: Internal Medicine

## 2019-01-21 ENCOUNTER — Ambulatory Visit (INDEPENDENT_AMBULATORY_CARE_PROVIDER_SITE_OTHER): Payer: PPO | Admitting: Internal Medicine

## 2019-01-21 ENCOUNTER — Ambulatory Visit: Payer: PPO | Admitting: Internal Medicine

## 2019-01-21 VITALS — BP 126/72 | HR 55 | Temp 96.7°F | Resp 16 | Ht 60.0 in | Wt 147.2 lb

## 2019-01-21 DIAGNOSIS — Z0000 Encounter for general adult medical examination without abnormal findings: Secondary | ICD-10-CM

## 2019-01-21 DIAGNOSIS — Z23 Encounter for immunization: Secondary | ICD-10-CM | POA: Diagnosis not present

## 2019-01-21 LAB — CBC WITH DIFFERENTIAL/PLATELET
Basophils Absolute: 0.1 10*3/uL (ref 0.0–0.1)
Basophils Relative: 1.1 % (ref 0.0–3.0)
Eosinophils Absolute: 0.3 10*3/uL (ref 0.0–0.7)
Eosinophils Relative: 3.9 % (ref 0.0–5.0)
HCT: 41.2 % (ref 36.0–46.0)
Hemoglobin: 13.5 g/dL (ref 12.0–15.0)
Lymphocytes Relative: 18.2 % (ref 12.0–46.0)
Lymphs Abs: 1.3 10*3/uL (ref 0.7–4.0)
MCHC: 32.9 g/dL (ref 30.0–36.0)
MCV: 91.2 fl (ref 78.0–100.0)
Monocytes Absolute: 0.8 10*3/uL (ref 0.1–1.0)
Monocytes Relative: 10.6 % (ref 3.0–12.0)
Neutro Abs: 4.8 10*3/uL (ref 1.4–7.7)
Neutrophils Relative %: 66.2 % (ref 43.0–77.0)
Platelets: 252 10*3/uL (ref 150.0–400.0)
RBC: 4.51 Mil/uL (ref 3.87–5.11)
RDW: 13.3 % (ref 11.5–15.5)
WBC: 7.2 10*3/uL (ref 4.0–10.5)

## 2019-01-21 LAB — COMPREHENSIVE METABOLIC PANEL
ALT: 14 U/L (ref 0–35)
AST: 17 U/L (ref 0–37)
Albumin: 4.1 g/dL (ref 3.5–5.2)
Alkaline Phosphatase: 86 U/L (ref 39–117)
BUN: 14 mg/dL (ref 6–23)
CO2: 32 mEq/L (ref 19–32)
Calcium: 9.7 mg/dL (ref 8.4–10.5)
Chloride: 102 mEq/L (ref 96–112)
Creatinine, Ser: 0.76 mg/dL (ref 0.40–1.20)
GFR: 74.17 mL/min (ref >60.00)
Glucose, Bld: 73 mg/dL (ref 70–99)
Potassium: 4.2 mEq/L (ref 3.5–5.1)
Sodium: 140 mEq/L (ref 135–145)
Total Bilirubin: 0.6 mg/dL (ref 0.2–1.2)
Total Protein: 6.5 g/dL (ref 6.0–8.3)

## 2019-01-21 LAB — LIPID PANEL
Cholesterol: 199 mg/dL (ref 0–200)
HDL: 60.9 mg/dL (ref >39.00)
LDL Cholesterol: 120 mg/dL — ABNORMAL HIGH (ref 0–99)
NonHDL: 138.32
Total CHOL/HDL Ratio: 3
Triglycerides: 90 mg/dL (ref 0.0–149.0)
VLDL: 18 mg/dL (ref 0.0–40.0)

## 2019-01-21 LAB — TSH: TSH: 2.01 u[IU]/mL (ref 0.35–4.50)

## 2019-01-21 NOTE — Assessment & Plan Note (Addendum)
-   Td ~2014 - PNM 23: 2010.  Booster 01/2019 - prevnar-- 2016 - S/p zostavax - s/p shingrix   - had a flu shot  -CCS:    Dr Collene Mares , 2nd cscope  09-2011; had a cscope 2018, 5 years -Female care per gyn, plans to see them this year, MMG 12/2018 -Doing great with exercise, active almost every day, eating healthy -Labs: CMP, FLP, CBC, TSH

## 2019-01-21 NOTE — Progress Notes (Signed)
Subjective:    Patient ID: Emily Thompson, female    DOB: 10-Mar-1946, 73 y.o.   MRN: [id number]  DOS:  01/21/2019 Type of visit - description: Here for CPX In general feeling well  Review of Systems  A 14 point review of systems is negative   Past Medical History:  Diagnosis Date  . BCC (basal cell carcinoma of skin) 2013, 2016  . Breast cancer ([place]) 2003   chemo, radiation, lumpectomy, reconstructive surgery   . Complication of anesthesia   . Dyslipidemia   . Endometrial cancer ([place])   . GERD (gastroesophageal reflux disease)   . Hypertension   . Melanoma ([place]) 2012   righ side face; basal cell (nose)   . Osteopenia   . PONV (postoperative nausea and vomiting)   . SVT (supraventricular tachycardia) ([facility])    used to see Dr Rex Kras     Past Surgical History:  Procedure Laterality Date  . ABDOMINAL HYSTERECTOMY  2016  . BASAL CELL CARCINOMA EXCISION    . BREAST LUMPECTOMY Left 07/2001  . BREAST RECONSTRUCTION  2005  . [place]  . COLONOSCOPY W/ POLYPECTOMY  2013   NEXT DUE IN 5 YRS  . NM MYOCAR PERF WALL MOTION  01/2006   bruce myoview; no evidence of inducible ischemia, anterior wall thinning without evidence of ischemia; post-stress EF 88%; low risk scan   . NOSE SURGERY     basal cell carcinoma removed  . ROBOTIC ASSISTED TOTAL HYSTERECTOMY WITH BILATERAL SALPINGO OOPHERECTOMY Bilateral 01/27/2015   Procedure: ROBOTIC ASSISTED TOTAL HYSTERECTOMY WITH BILATERAL SALPINGO OOPHORECTOMY AND SENTINAL NODE BIOPSY;  Surgeon: Everitt Amber, MD;  Location: [facility];  Service: Gynecology;  Laterality: Bilateral;  . SKIN CANCER EXCISION     melanoma  . TONSILLECTOMY AND ADENOIDECTOMY  1952  . TRANSTHORACIC ECHOCARDIOGRAM  12/2005   EF 50-55%; mild MR; trace TR  . TUBAL LIGATION  1978  . VULVA /PERINEUM BIOPSY     PAPILLOMA    Social History   Socioeconomic History  . Marital status: Widowed    Spouse name: Not on file  . Number of children: 2  . Years of  education: BSN  . Highest education level: Not on file  Occupational History  . 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 Financial resource strain: Not on file  . Food insecurity    Worry: Not on file    Inability: Not on file  . Transportation needs    Medical: Not on file    Non-medical: Not on file  Tobacco Use  . Smoking status: Former Smoker    Years: 10.00    Types: Cigarettes    Quit date: 05/20/1973    Years since quitting: 45.7  . Smokeless tobacco: Never Used  Substance and Sexual Activity  . Alcohol use: Yes    Alcohol/week: 4.0 standard drinks    Types: 2 Glasses of wine, 2 Cans of beer per week    Comment: 4 A WEEK  . Drug use: No  . Sexual activity: Not Currently    Birth control/protection: Post-menopausal  Lifestyle  . Physical activity    Days per week: Not on file    Minutes per session: Not on file  . Stress: Not on file  Relationships  . Social Herbalist on phone: Not on file    Gets together: Not on file    Attends religious  service: Not on file    Active member of club or organization: Not on file    Attends meetings of clubs or organizations: Not on file    Relationship status: Not on file  . Intimate partner violence    Fear of current or ex partner: Not on file    Emotionally abused: Not on file    Physically abused: Not on file    Forced sexual activity: Not on file  Other Topics Concern  . Not on file  Social History Narrative   Widow, 1 adopted and 1 natural child; ([place], [place])   Live by herself     Family History  Problem Relation Age of Onset  . Hypertension Mother   . Thyroid disease Mother        M and B (?)  . Hypertension Father   . Stroke Father        hemorrhagic CVA at age 69  . Bipolar disorder Brother   . Diabetes Brother   . Colon cancer Maternal Aunt        dx at age 59s  . Breast cancer Neg Hx      Allergies as of 01/21/2019      Reactions    Dextromethorphan    Per pt, increases BP   Hydrocodone    Wires her up SEVERE NAUSEA   Statins    Headache, hot flashes   Morphine And Related    Could not move SEVERE NAUSEA   Tape Rash      Medication List       Accurate as of January 21, 2019 11:59 PM. If you have any questions, ask your nurse or doctor.        aspirin 81 MG tablet Take 81 mg by mouth daily.   betamethasone valerate ointment 0.1 % Commonly known as: VALISONE Use twice daily as needed   CALCIUM-VITAMIN D-VITAMIN K PO Take 1,200 mg by mouth daily.   cholecalciferol 1000 units tablet Commonly known as: VITAMIN D Take 1,000 Units by mouth daily.   diltiazem 240 MG 24 hr capsule Commonly known as: TIAZAC Take 1 capsule (240 mg total) by mouth daily.   ezetimibe 10 MG tablet Commonly known as: ZETIA Take 1 tablet (10 mg total) by mouth daily.   hydrochlorothiazide 25 MG tablet Commonly known as: HYDRODIURIL Take 1 tablet (25 mg total) by mouth daily.   ibandronate 150 MG tablet Commonly known as: Boniva Take 1 tablet (150 mg total) by mouth every 30 (thirty) days. Take in am with a full glass of water,  do not  lie down for the next 30 min.   lansoprazole 15 MG capsule Commonly known as: PREVACID Take 15 mg by mouth daily at 12 noon.   meloxicam 15 MG tablet Commonly known as: MOBIC Take 1 tablet (15 mg total) by mouth daily as needed for pain.   PRESERVISION AREDS 2 PO Take 1 capsule by mouth daily.   telmisartan 40 MG tablet Commonly known as: MICARDIS Take 1 tablet (40 mg total) by mouth every evening.           Objective:   Physical Exam BP 126/72 (BP Location: Left Arm, Patient Position: Sitting, Cuff Size: Small)   Pulse (!) 55   Temp (!) 96.7 F (35.9 C) (Temporal)   Resp 16   Ht 5' (1.524 m)   Wt 147 lb 4 oz (66.8 kg)   LMP 05/14/1997   SpO2 98%   BMI 28.76 kg/m  General: Well developed, NAD, BMI noted Neck: No  thyromegaly  HEENT:  Normocephalic . Face  symmetric, atraumatic Lungs:  CTA B Normal respiratory effort, no intercostal retractions, no accessory muscle use. Heart: RRR,  no murmur.  No pretibial edema bilaterally  Abdomen:  Not distended, soft, non-tender. No rebound or rigidity.   Skin: Exposed areas without rash. Not pale. Not jaundice Neurologic:  alert & oriented X3.  Speech normal, gait appropriate for age and unassisted Strength symmetric and appropriate for age.  Psych: Cognition and judgment appear intact.  Cooperative with normal attention span and concentration.  Behavior appropriate. No anxious or depressed appearing.     Assessment      Assessment HTN Hyperlipidemia -- statin intolerant  GERD Osteopenia --dexa 2016 (per gyn); rx boniva 05-2017 @ gyn Insomnia  MSK: ---DJD,  NSAIDs prn --Back pain, --Dr Tommie Raymond ~ 2011, saw the spine center ~ 2015 , dx w/  spinal stenosis (no MRI, clinical dx ) --2017-2018: PT, injections Dr Catha Brow Cough-- hycodan prn (tolerates well small doses hydrocodone) Oncology: --Breast cancer 2003 --Endometrial cancer, H-BSO 01/2015, will see hematology x 5 years (gyn-onc) --Melanoma, BCC (Dr Delman Cheadle) Lung nodule, 46mm, per CTs, last CT 09/30/2015 : stable  H/o SVT  HOH   PLAN: HTN: Good compliance with diltiazem, HCTZ, Micardis.  Reports good ambulatory BPs Hyperlipidemia: On Zetia, checking labs. Osteopenia: Per gynecology. DJD: Does have aches and pains particularly at the right shoulder and back, managing with occasional NSAIDs and physical activity. Anxiety: Mild, related to the quarantine.  Knows to reach out if she feels she needs help. Oncology: Follow-up by other providers. RTC 1 year

## 2019-01-21 NOTE — Progress Notes (Signed)
Pre visit review using our clinic review tool, if applicable. No additional management support is needed unless otherwise documented below in the visit note. 

## 2019-01-21 NOTE — Patient Instructions (Addendum)
Please schedule Medicare Wellness with Glenard Haring.   GO TO THE LAB : Get the blood work     GO TO THE FRONT DESK Schedule your next appointment   for physical exam in 1 year

## 2019-01-22 NOTE — Assessment & Plan Note (Signed)
HTN: Good compliance with diltiazem, HCTZ, Micardis.  Reports good ambulatory BPs Hyperlipidemia: On Zetia, checking labs. Osteopenia: Per gynecology. DJD: Does have aches and pains particularly at the right shoulder and back, managing with occasional NSAIDs and physical activity. Anxiety: Mild, related to the quarantine.  Knows to reach out if she feels she needs help. Oncology: Follow-up by other providers. RTC 1 year

## 2019-01-27 ENCOUNTER — Encounter: Payer: Self-pay | Admitting: Internal Medicine

## 2019-02-24 DIAGNOSIS — C44329 Squamous cell carcinoma of skin of other parts of face: Secondary | ICD-10-CM | POA: Diagnosis not present

## 2019-03-10 ENCOUNTER — Other Ambulatory Visit: Payer: Self-pay

## 2019-03-11 ENCOUNTER — Encounter: Payer: Self-pay | Admitting: Women's Health

## 2019-03-11 ENCOUNTER — Ambulatory Visit (INDEPENDENT_AMBULATORY_CARE_PROVIDER_SITE_OTHER): Payer: PPO | Admitting: Women's Health

## 2019-03-11 VITALS — BP 120/78 | Ht 60.0 in | Wt 147.0 lb

## 2019-03-11 DIAGNOSIS — Z01419 Encounter for gynecological examination (general) (routine) without abnormal findings: Secondary | ICD-10-CM

## 2019-03-11 DIAGNOSIS — Z9289 Personal history of other medical treatment: Secondary | ICD-10-CM

## 2019-03-11 MED ORDER — NYSTATIN 100000 UNIT/GM EX CREA: 1.0000 "application " | TOPICAL_CREAM | Freq: Two times a day (BID) | CUTANEOUS | 0 refills | Status: DC

## 2019-03-11 MED ORDER — IBANDRONATE SODIUM 150 MG PO TABS: 150.0000 mg | ORAL_TABLET | ORAL | 4 refills | Status: DC

## 2019-03-11 NOTE — Patient Instructions (Addendum)
Get the dexa at [facility] to see you today!!!  Health Maintenance After Age 75 After age 39, you are at a higher risk for certain long-term diseases and infections as well as injuries from falls. Falls are a major cause of broken bones and head injuries in people who are older than age 68. Getting regular preventive care can help to keep you healthy and well. Preventive care includes getting regular testing and making lifestyle changes as recommended by your health care provider. Talk with your health care provider about:  Which screenings and tests you should have. A screening is a test that checks for a disease when you have no symptoms.  A diet and exercise plan that is right for you. What should I know about screenings and tests to prevent falls? Screening and testing are the best ways to find a health problem early. Early diagnosis and treatment give you the best chance of managing medical conditions that are common after age 20. Certain conditions and lifestyle choices may make you more likely to have a fall. Your health care provider may recommend:  Regular vision checks. Poor vision and conditions such as cataracts can make you more likely to have a fall. If you wear glasses, make sure to get your prescription updated if your vision changes.  Medicine review. Work with your health care provider to regularly review all of the medicines you are taking, including over-the-counter medicines. Ask your health care provider about any side effects that may make you more likely to have a fall. Tell your health care provider if any medicines that you take make you feel dizzy or sleepy.  Osteoporosis screening. Osteoporosis is a condition that causes the bones to get weaker. This can make the bones weak and cause them to break more easily.  Blood pressure screening. Blood pressure changes and medicines to control blood pressure can make you feel dizzy.  Strength and balance checks. Your health  care provider may recommend certain tests to check your strength and balance while standing, walking, or changing positions.  Foot health exam. Foot pain and numbness, as well as not wearing proper footwear, can make you more likely to have a fall.  Depression screening. You may be more likely to have a fall if you have a fear of falling, feel emotionally low, or feel unable to do activities that you used to do.  Alcohol use screening. Using too much alcohol can affect your balance and may make you more likely to have a fall. What actions can I take to lower my risk of falls? General instructions  Talk with your health care provider about your risks for falling. Tell your health care provider if: ? You fall. Be sure to tell your health care provider about all falls, even ones that seem minor. ? You feel dizzy, sleepy, or off-balance.  Take over-the-counter and prescription medicines only as told by your health care provider. These include any supplements.  Eat a healthy diet and maintain a healthy weight. A healthy diet includes low-fat dairy products, low-fat (lean) meats, and fiber from whole grains, beans, and lots of fruits and vegetables. Home safety  Remove any tripping hazards, such as rugs, cords, and clutter.  Install safety equipment such as grab bars in bathrooms and safety rails on stairs.  Keep rooms and walkways well-lit. Activity   Follow a regular exercise program to stay fit. This will help you maintain your balance. Ask your health care provider what types of  exercise are appropriate for you.  If you need a cane or walker, use it as recommended by your health care provider.  Wear supportive shoes that have nonskid soles. Lifestyle  Do not drink alcohol if your health care provider tells you not to drink.  If you drink alcohol, limit how much you have: ? 0-1 drink a day for women. ? 0-2 drinks a day for men.  Be aware of how much alcohol is in your drink. In  the [place] one drink equals one typical bottle of beer (12 oz), one-half glass of wine (5 oz), or one shot of hard liquor (1 oz).  Do not use any products that contain nicotine or tobacco, such as cigarettes and e-cigarettes. If you need help quitting, ask your health care provider. Summary  Having a healthy lifestyle and getting preventive care can help to protect your health and wellness after age 28.  Screening and testing are the best way to find a health problem early and help you avoid having a fall. Early diagnosis and treatment give you the best chance for managing medical conditions that are more common for people who are older than age 19.  Falls are a major cause of broken bones and head injuries in people who are older than age 11. Take precautions to prevent a fall at home.  Work with your health care provider to learn what changes you can make to improve your health and wellness and to prevent falls. This information is not intended to replace advice given to you by your health care provider. Make sure you discuss any questions you have with your health care provider. Document Released: 03/13/2017 Document Revised: 08/21/2018 Document Reviewed: 03/13/2017 Elsevier Patient Education  2020 [employer].

## 2019-03-11 NOTE — Progress Notes (Signed)
Emily Thompson Oct 01, 1945 [id number]    History:    Presents for breast and pelvic exam.  2016 endometrial cancer TVH with BSO with negative lymph nodes.  Normal Pap history.  2003 left breast cancer.  2018 - colonoscopy.  2018 T score -2.4 FRAX 14% / 3.4% has been on Boniva 150 mg monthly for 2 years.  History of melanoma on her face has skin checks annually recently squamous cell on left cheek.  Primary care manages hypertension, hypercholesteremia, long-term partner not sexually active due to his health.  Vaccines current.   Past medical history, past surgical history, family history and social history were all reviewed and documented in the EPIC chart.  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  ROS:  A ROS was performed and pertinent positives and negatives are included.  Exam:  Vitals:   03/11/19 1356  BP: 120/78  Weight: 147 lb (66.7 kg)  Height: 5' (1.524 m)   Body mass index is 28.71 kg/m.   General appearance:  Normal Thyroid:  Symmetrical, normal in size, without palpable masses or nodularity. Respiratory  Auscultation:  Clear without wheezing or rhonchi Cardiovascular  Auscultation:  Regular rate, without rubs, murmurs or gallops  Edema/varicosities:  Not grossly evident Abdominal  Soft,nontender, without masses, guarding or rebound.  Liver/spleen:  No organomegaly noted  Hernia:  None appreciated  Skin  Inspection:  Grossly normal   Breasts: Examined lying and sitting.     Right: Reduction without masses, retractions, discharge or axillary adenopathy.     Left: Lumpectomy with implant . Gentitourinary   Inguinal/mons:  Normal without inguinal adenopathy  External genitalia:  Normal  BUS/Urethra/Skene's glands:  Normal  Vagina: Atrophic   Cervix: And uterus absent   Adnexa/parametria:     Rt: Without masses or tenderness.   Lt: Without masses or tenderness.  Anus and  perineum: Normal  Digital rectal exam: Normal sphincter tone without palpated masses or tenderness  Assessment/Plan:  73 y.o. D WF G1 P1 +1 adopted daughter for breast and pelvic exam with no complaints.   2016 endometrial cancer TVH with BSO negative lymph nodes 2003 left breast cancer lumpectomy, chemotherapy and radiation Osteopenia with elevated FRAX on Boniva Melanoma-annual skin checks Hypertension and hypercholesteremia-primary care manages labs and meds  Plan: SBEs, continue annual 3D screening mammogram, calcium rich foods, vitamin D 2000 daily encouraged.  Continue healthy lifestyle of regular exercise, weightbearing and balance type exercise, yoga.  Home safety fall prevention discussed.  Boniva 150 mg p.o. monthly prescription, proper use given and reviewed.  Schedule DEXA at [place], 2:15 PM 03/11/2019

## 2019-03-12 LAB — URINALYSIS, COMPLETE W/RFL CULTURE
Bacteria, UA: NONE SEEN /HPF
Bilirubin Urine: NEGATIVE
Glucose, UA: NEGATIVE
Hgb urine dipstick: NEGATIVE
Hyaline Cast: NONE SEEN /LPF
Ketones, ur: NEGATIVE
Leukocyte Esterase: NEGATIVE
Nitrites, Initial: NEGATIVE
Protein, ur: NEGATIVE
RBC / HPF: NONE SEEN /HPF (ref 0–2)
Specific Gravity, Urine: 1.009 (ref 1.001–1.03)
Squamous Epithelial / HPF: NONE SEEN /HPF (ref <=5)
WBC, UA: NONE SEEN /HPF (ref 0–5)
pH: 7.5 (ref 5.0–8.0)

## 2019-03-12 LAB — NO CULTURE INDICATED

## 2019-04-07 DIAGNOSIS — C50912 Malignant neoplasm of unspecified site of left female breast: Secondary | ICD-10-CM | POA: Diagnosis not present

## 2019-04-08 DIAGNOSIS — C50912 Malignant neoplasm of unspecified site of left female breast: Secondary | ICD-10-CM | POA: Diagnosis not present

## 2019-04-20 ENCOUNTER — Encounter: Payer: Self-pay | Admitting: Internal Medicine

## 2019-05-05 DIAGNOSIS — Z8542 Personal history of malignant neoplasm of other parts of uterus: Secondary | ICD-10-CM | POA: Diagnosis not present

## 2019-05-05 DIAGNOSIS — Z853 Personal history of malignant neoplasm of breast: Secondary | ICD-10-CM | POA: Diagnosis not present

## 2019-05-05 DIAGNOSIS — M8589 Other specified disorders of bone density and structure, multiple sites: Secondary | ICD-10-CM | POA: Diagnosis not present

## 2019-05-05 DIAGNOSIS — K219 Gastro-esophageal reflux disease without esophagitis: Secondary | ICD-10-CM | POA: Diagnosis not present

## 2019-05-05 DIAGNOSIS — Z90710 Acquired absence of both cervix and uterus: Secondary | ICD-10-CM | POA: Diagnosis not present

## 2019-05-12 DIAGNOSIS — H353132 Nonexudative age-related macular degeneration, bilateral, intermediate dry stage: Secondary | ICD-10-CM | POA: Diagnosis not present

## 2019-05-18 ENCOUNTER — Encounter: Payer: Self-pay | Admitting: Women's Health

## 2019-05-27 ENCOUNTER — Ambulatory Visit: Payer: Medicare Other | Attending: Internal Medicine

## 2019-05-27 DIAGNOSIS — Z23 Encounter for immunization: Secondary | ICD-10-CM | POA: Insufficient documentation

## 2019-05-27 NOTE — Progress Notes (Signed)
   Covid-19 Vaccination Clinic  Name:  Emily Thompson    MRN: [id number] DOB: [birth year]  05/27/2019  Emily Thompson was observed post Covid-19 immunization for 15 minutes without incidence. She was provided with Vaccine Information Sheet and instruction to access the V-Safe system.   Emily Thompson was instructed to call 911 with any severe reactions post vaccine: Marland Kitchen Difficulty breathing  . Swelling of your face and throat  . A fast heartbeat  . A bad rash all over your body  . Dizziness and weakness    Immunizations Administered    Name Date Dose VIS Date Route   Pfizer COVID-19 Vaccine 05/27/2019 11:50 AM 0.3 mL 04/24/2019 Intramuscular   Manufacturer: [employer]   Lot: [id number]   [place]: [id number]

## 2019-06-16 ENCOUNTER — Encounter: Payer: Self-pay | Admitting: Internal Medicine

## 2019-06-16 ENCOUNTER — Ambulatory Visit: Payer: Medicare Other | Attending: Internal Medicine

## 2019-06-16 DIAGNOSIS — Z23 Encounter for immunization: Secondary | ICD-10-CM

## 2019-06-16 NOTE — Progress Notes (Signed)
   Covid-19 Vaccination Clinic  Name:  Emily Thompson    MRN: [id number] DOB: 12/13/1945  06/16/2019  [name] was observed post Covid-19 immunization for 15 minutes without incidence. She was provided with Vaccine Information Sheet and instruction to access the V-Safe system.   [name] was instructed to call 911 with any severe reactions post vaccine: Marland Kitchen Difficulty breathing  . Swelling of your face and throat  . A fast heartbeat  . A bad rash all over your body  . Dizziness and weakness    Immunizations Administered    Name Date Dose VIS Date Route   Pfizer COVID-19 Vaccine 06/16/2019 10:48 AM 0.3 mL 04/24/2019 Intramuscular   Manufacturer: [place]   Lot: [id number]   [place]: [id number]

## 2019-07-09 ENCOUNTER — Telehealth: Payer: Self-pay | Admitting: Internal Medicine

## 2019-07-09 NOTE — Progress Notes (Signed)
°  Chronic Care Management   Outreach Note  07/09/2019 Name: TEMEKA SWANIGAN MRN: [id number] DOB: [birth year]  Referred by: Colon Branch, MD Reason for referral : No chief complaint on file.   An unsuccessful telephone outreach was attempted today. The patient was referred to the pharmacist for assistance with care management and care coordination.   Follow Up Plan:   Raynicia Dukes [employer] Scheduler

## 2019-07-18 ENCOUNTER — Other Ambulatory Visit: Payer: Self-pay | Admitting: Internal Medicine

## 2019-07-30 ENCOUNTER — Other Ambulatory Visit: Payer: Self-pay | Admitting: Internal Medicine

## 2019-08-01 ENCOUNTER — Other Ambulatory Visit: Payer: Self-pay

## 2019-08-01 ENCOUNTER — Encounter (HOSPITAL_BASED_OUTPATIENT_CLINIC_OR_DEPARTMENT_OTHER): Payer: Self-pay | Admitting: Emergency Medicine

## 2019-08-01 ENCOUNTER — Emergency Department (HOSPITAL_BASED_OUTPATIENT_CLINIC_OR_DEPARTMENT_OTHER): Payer: PPO

## 2019-08-01 ENCOUNTER — Emergency Department (HOSPITAL_BASED_OUTPATIENT_CLINIC_OR_DEPARTMENT_OTHER)
Admission: EM | Admit: 2019-08-01 | Discharge: 2019-08-01 | Disposition: A | Discharge status: Home / Self Care | Payer: PPO | Attending: Emergency Medicine | Admitting: Emergency Medicine

## 2019-08-01 DIAGNOSIS — Z7982 Long term (current) use of aspirin: Secondary | ICD-10-CM | POA: Insufficient documentation

## 2019-08-01 DIAGNOSIS — Z8542 Personal history of malignant neoplasm of other parts of uterus: Secondary | ICD-10-CM | POA: Insufficient documentation

## 2019-08-01 DIAGNOSIS — R079 Chest pain, unspecified: Secondary | ICD-10-CM | POA: Diagnosis not present

## 2019-08-01 DIAGNOSIS — I10 Essential (primary) hypertension: Secondary | ICD-10-CM | POA: Diagnosis not present

## 2019-08-01 DIAGNOSIS — Z20822 Contact with and (suspected) exposure to covid-19: Secondary | ICD-10-CM | POA: Diagnosis not present

## 2019-08-01 DIAGNOSIS — Z85828 Personal history of other malignant neoplasm of skin: Secondary | ICD-10-CM | POA: Diagnosis not present

## 2019-08-01 DIAGNOSIS — Z87891 Personal history of nicotine dependence: Secondary | ICD-10-CM | POA: Insufficient documentation

## 2019-08-01 DIAGNOSIS — Z79899 Other long term (current) drug therapy: Secondary | ICD-10-CM | POA: Diagnosis not present

## 2019-08-01 DIAGNOSIS — Z853 Personal history of malignant neoplasm of breast: Secondary | ICD-10-CM | POA: Diagnosis not present

## 2019-08-01 DIAGNOSIS — M25512 Pain in left shoulder: Secondary | ICD-10-CM | POA: Diagnosis not present

## 2019-08-01 LAB — COMPREHENSIVE METABOLIC PANEL
ALT: 23 U/L (ref 0–44)
AST: 25 U/L (ref 15–41)
Albumin: 4.1 g/dL (ref 3.5–5.0)
Alkaline Phosphatase: 80 U/L (ref 38–126)
Anion gap: 10 (ref 5–15)
BUN: 16 mg/dL (ref 8–23)
CO2: 28 mmol/L (ref 22–32)
Calcium: 9.6 mg/dL (ref 8.9–10.3)
Chloride: 105 mmol/L (ref 98–111)
Creatinine, Ser: 0.78 mg/dL (ref 0.44–1.00)
GFR calc Af Amer: >60 mL/min (ref >60)
GFR calc non Af Amer: >60 mL/min (ref >60)
Glucose, Bld: 92 mg/dL (ref 70–99)
Potassium: 4.2 mmol/L (ref 3.5–5.1)
Sodium: 143 mmol/L (ref 135–145)
Total Bilirubin: 0.7 mg/dL (ref 0.3–1.2)
Total Protein: 6.7 g/dL (ref 6.5–8.1)

## 2019-08-01 LAB — CBC
HCT: 42.3 % (ref 36.0–46.0)
Hemoglobin: 13.7 g/dL (ref 12.0–15.0)
MCH: 30.5 pg (ref 26.0–34.0)
MCHC: 32.4 g/dL (ref 30.0–36.0)
MCV: 94.2 fL (ref 80.0–100.0)
Platelets: 254 10*3/uL (ref 150–400)
RBC: 4.49 MIL/uL (ref 3.87–5.11)
RDW: 12.8 % (ref 11.5–15.5)
WBC: 7.2 10*3/uL (ref 4.0–10.5)
nRBC: 0 % (ref 0.0–0.2)

## 2019-08-01 LAB — TROPONIN I (HIGH SENSITIVITY): Troponin I (High Sensitivity): 10 ng/L (ref <18)

## 2019-08-01 MED ORDER — KETOROLAC TROMETHAMINE 60 MG/2ML IM SOLN: 30.0000 mg | Freq: Once | INTRAMUSCULAR | Status: DC

## 2019-08-01 NOTE — ED Provider Notes (Signed)
[facility] EMERGENCY DEPARTMENT Provider Note  CSN: [id number] Arrival date & time: 08/01/19 0446  Chief Complaint(s) Shoulder Pain  HPI Emily Thompson is a 74 y.o. female with a past medical history listed below who presents to the emergency department with about 20 hours of gradually worsening left shoulder/upper chest pain described as an achiness.  Pain worse when lying still.  Improved with movement.  No associated shortness of breath.  No nausea or vomiting.  No recent fevers or infections.  No shortness of breath.  No abdominal pain.  No trauma.  HPI  Past Medical History Past Medical History:  Diagnosis Date  . BCC (basal cell carcinoma of skin) 2013, 2016  . Breast cancer ([place]) 2003   chemo, radiation, lumpectomy, reconstructive surgery   . Complication of anesthesia   . Dyslipidemia   . Endometrial cancer ([place])   . GERD (gastroesophageal reflux disease)   . Hypertension   . Melanoma ([place]) 2012   righ side face; basal cell (nose)   . Osteopenia   . PONV (postoperative nausea and vomiting)   . SVT (supraventricular tachycardia) ([place])    used to see Dr Rex Kras    Patient Active Problem List   Diagnosis Date Noted  . PCP NOTES >>>>>>>>>>>>>>>>>>>>>>>>>>>>>>>>>>>>> 07/03/2015  . Endometrial cancer ([place]) 01/27/2015  . Thickened endometrium 01/05/2015  . Postmenopausal bleeding 01/03/2015  . DJD (degenerative joint disease) 05/27/2013  . Palpitations 05/20/2013  . Melanoma ([place])   . Allergic rhinitis 08/13/2011  . Annual physical exam 05/04/2011  . History of breast cancer, left.  March 2003. 12/29/2010  . Breast cancer ([place]) 12/25/2010  . Menopause 12/25/2010  . Post-menopausal atrophic vaginitis 12/25/2010  . Hyperlipidemia 03/14/2010  . HTN (hypertension) 03/14/2010  . GERD 03/14/2010  . OSTEOPENIA 03/14/2010   Home Medication(s) Prior to Admission medications   Medication Sig Start Date End Date Taking? Authorizing Provider  aspirin 81 MG  tablet Take 81 mg by mouth daily.     [provider]  betamethasone valerate ointment (VALISONE) 0.1 % Use twice daily as needed 10/30/17   [name], NP  CALCIUM-VITAMIN D-VITAMIN K PO Take 1,200 mg by mouth daily.    [provider]  cholecalciferol (VITAMIN D) 1000 units tablet Take 1,000 Units by mouth daily.    [provider]  diltiazem (TIAZAC) 240 MG 24 hr capsule Take 1 capsule (240 mg total) by mouth daily. 08/12/18   Colon Branch, MD  ezetimibe (ZETIA) 10 MG tablet TAKE 1 TABLET BY MOUTH EVERY DAY 07/20/19   Colon Branch, MD  hydrochlorothiazide (HYDRODIURIL) 25 MG tablet Take 1 tablet (25 mg total) by mouth daily. 07/30/19   Colon Branch, MD  ibandronate (BONIVA) 150 MG tablet Take 1 tablet (150 mg total) by mouth every 30 (thirty) days. Take in am with a full glass of water,  do not  lie down for the next 30 min. 03/11/19   [name], NP  lansoprazole (PREVACID) 15 MG capsule Take 15 mg by mouth daily at 12 noon.    [provider]  meloxicam (MOBIC) 15 MG tablet Take 1 tablet (15 mg total) by mouth daily as needed for pain. 06/10/18   Colon Branch, MD  Multiple Vitamins-Minerals (PRESERVISION AREDS 2 PO) Take 1 capsule by mouth daily.    [provider]  nystatin cream (MYCOSTATIN) Apply 1 application topically 2 (two) times daily. 03/11/19   [name], NP  telmisartan (MICARDIS) 40 MG  tablet Take 1 tablet (40 mg total) by mouth every evening. 07/31/18   Colon Branch, MD                                                                                                                                    Past Surgical History Past Surgical History:  Procedure Laterality Date  . ABDOMINAL HYSTERECTOMY  2016  . BASAL CELL CARCINOMA EXCISION    . BREAST LUMPECTOMY Left 07/2001  . BREAST RECONSTRUCTION  2005  . [place]  . COLONOSCOPY W/ POLYPECTOMY  2013   NEXT DUE IN 5 YRS  . NM MYOCAR PERF WALL MOTION  01/2006   bruce  myoview; no evidence of inducible ischemia, anterior wall thinning without evidence of ischemia; post-stress EF 88%; low risk scan   . NOSE SURGERY     basal cell carcinoma removed  . ROBOTIC ASSISTED TOTAL HYSTERECTOMY WITH BILATERAL SALPINGO OOPHERECTOMY Bilateral 01/27/2015   Procedure: ROBOTIC ASSISTED TOTAL HYSTERECTOMY WITH BILATERAL SALPINGO OOPHORECTOMY AND SENTINAL NODE BIOPSY;  Surgeon: Everitt Amber, MD;  Location: [facility];  Service: Gynecology;  Laterality: Bilateral;  . SKIN CANCER EXCISION     melanoma  . TONSILLECTOMY AND ADENOIDECTOMY  1952  . TRANSTHORACIC ECHOCARDIOGRAM  12/2005   EF 50-55%; mild MR; trace TR  . TUBAL LIGATION  1978  . VULVA /PERINEUM BIOPSY     PAPILLOMA   Family History Family History  Problem Relation Age of Onset  . Hypertension Mother   . Thyroid disease Mother        M and B (?)  . Hypertension Father   . Stroke Father        hemorrhagic CVA at age 66  . Bipolar disorder Brother   . Diabetes Brother   . Colon cancer Maternal Aunt        dx at age 1s  . Breast cancer Neg Hx     Social History Social History   Tobacco Use  . Smoking status: Former Smoker    Years: 10.00    Types: Cigarettes    Quit date: 05/20/1973    Years since quitting: 46.2  . Smokeless tobacco: Never Used  Substance Use Topics  . Alcohol use: Yes    Alcohol/week: 4.0 standard drinks    Types: 2 Glasses of wine, 2 Cans of beer per week    Comment: 4 A WEEK  . Drug use: No   Allergies Dextromethorphan, Hydrocodone, Statins, Morphine and related, and Tape  Review of Systems Review of Systems All other systems are reviewed and are negative for acute change except as noted in the HPI  Physical Exam Vital Signs  I have reviewed the triage vital signs BP (!) 175/99 (BP Location: Right Arm)   Pulse 77   Temp 97.6 F (36.4 C) (Oral)   Resp 18   Ht 5' (1.524 m)   Wt 67.1 kg  LMP 05/14/1997   SpO2 99%   BMI 28.90 kg/m   Physical Exam Vitals reviewed.    Constitutional:      General: She is not in acute distress.    Appearance: She is well-developed. She is not diaphoretic.  HENT:     Head: Normocephalic and atraumatic.     Nose: Nose normal.  Eyes:     General: No scleral icterus.       Right eye: No discharge.        Left eye: No discharge.     Conjunctiva/sclera: Conjunctivae normal.     Pupils: Pupils are equal, round, and reactive to light.  Cardiovascular:     Rate and Rhythm: Normal rate and regular rhythm.     Heart sounds: No murmur. No friction rub. No gallop.   Pulmonary:     Effort: Pulmonary effort is normal. No respiratory distress.     Breath sounds: Normal breath sounds. No stridor. No rales.  Abdominal:     General: There is no distension.     Palpations: Abdomen is soft.     Tenderness: There is no abdominal tenderness.  Musculoskeletal:        General: No tenderness.     Cervical back: Normal range of motion and neck supple.  Skin:    General: Skin is warm and dry.     Findings: No erythema or rash.  Neurological:     Mental Status: She is alert and oriented to person, place, and time.     ED Results and Treatments Labs (all labs ordered are listed, but only abnormal results are displayed) Labs Reviewed  CBC  COMPREHENSIVE METABOLIC PANEL  TROPONIN I (HIGH SENSITIVITY)  TROPONIN I (HIGH SENSITIVITY)                                                                                                                         EKG  EKG Interpretation  Date/Time:  Saturday August 01 2019 05:45:25 EDT Ventricular Rate:  72 PR Interval:    QRS Duration: 144 QT Interval:  451 QTC Calculation: 494 R Axis:   82 Text Interpretation: Sinus rhythm Consider right atrial enlargement Right bundle branch block Confirmed by Addison Lank [phone]) on 08/01/2019 6:36:35 AM      Radiology DG Chest 2 View  Result Date: 08/01/2019 CLINICAL DATA:  Left shoulder pain EXAM: CHEST - 2 VIEW COMPARISON:  None. FINDINGS: The  heart size and mediastinal contours are within normal limits. Both lungs are clear. The visualized skeletal structures are unremarkable. IMPRESSION: No active cardiopulmonary disease. Electronically Signed   By: Ulyses Jarred M.D.   On: 08/01/2019 06:43    Pertinent labs & imaging results that were available during my care of the patient were reviewed by me and considered in my medical decision making (see chart for details).  Medications Ordered in ED Medications  ketorolac (TORADOL) injection 30 mg (30 mg Intramuscular Refused 08/01/19 0653)  Procedures Procedures  (including critical care time)  Medical Decision Making / ED Course I have reviewed the nursing notes for this encounter and the patient's prior records (if available in EHR or on provided paperwork).   JANEIRY DICECCO was evaluated in Emergency Department on 08/01/2019 for the symptoms described in the history of present illness. She was evaluated in the context of the global COVID-19 pandemic, which necessitated consideration that the patient might be at risk for infection with the SARS-CoV-2 virus that causes COVID-19. Institutional protocols and algorithms that pertain to the evaluation of patients at risk for COVID-19 are in a state of rapid change based on information released by regulatory bodies including the CDC and federal and state organizations. These policies and algorithms were followed during the patient's care in the ED.  Left shoulder pain.  Atypical for ACS.  EKG without acute ischemic changes or evidence of pericarditis.  Troponin drawn greater than 16 hours since pain onset negative.  Heart score 3.  Feel this is sufficient to rule out ACS.  No suspicion for pulmonary embolism.  Presentation not classic for either dissection or esophageal perforation.  Chest x-ray without evidence  suggestive of pneumonia, pneumothorax, pneumomediastinum.  No abnormal contour of the mediastinum to suggest dissection. No evidence of acute injuries.        Final Clinical Impression(s) / ED Diagnoses Final diagnoses:  Chest pain  Acute pain of left shoulder   The patient appears reasonably screened and/or stabilized for discharge and I doubt any other medical condition or other [facility] requiring further screening, evaluation, or treatment in the ED at this time prior to discharge. Safe for discharge with strict return precautions.  Disposition: Discharge  Condition: Good  I have discussed the results, Dx and Tx plan with the patient/family who expressed understanding and agree(s) with the plan. Discharge instructions discussed at length. The patient/family was given strict return precautions who verbalized understanding of the instructions. No further questions at time of discharge.    ED Discharge Orders    None       Follow Up: Colon Branch, [address] [place] [address] [phone]  Schedule an appointment as soon as possible for a visit  As needed      This chart was dictated using voice recognition software.  Despite best efforts to proofread,  errors can occur which can change the documentation meaning.   Fatima Blank, MD 08/01/19 [phone]

## 2019-08-01 NOTE — ED Triage Notes (Signed)
Patient presents with complaints of left shoulder pain and neck pain onset yesterday; states pain worsened during the night and it woke her up. Patient states took 2 ibuprofen last night and tylenol yesterday.

## 2019-09-20 ENCOUNTER — Other Ambulatory Visit: Payer: Self-pay | Admitting: Internal Medicine

## 2019-10-05 ENCOUNTER — Telehealth: Payer: Self-pay

## 2019-10-05 NOTE — Telephone Encounter (Signed)
Triage line called, patient has been experiencing palpations that have been going on for a while. She would like a cardiology referral. Seen cardio in the past but did not get a long with that particular doctor. Please advise if we bring her in since this has been going on?

## 2019-10-05 NOTE — Telephone Encounter (Signed)
Last ov 01/2019- needs visit here.

## 2019-10-07 ENCOUNTER — Encounter: Payer: Self-pay | Admitting: Internal Medicine

## 2019-10-07 ENCOUNTER — Other Ambulatory Visit: Payer: Self-pay

## 2019-10-07 ENCOUNTER — Ambulatory Visit (INDEPENDENT_AMBULATORY_CARE_PROVIDER_SITE_OTHER): Payer: PPO | Admitting: Internal Medicine

## 2019-10-07 VITALS — BP 145/67 | HR 71 | Temp 95.9°F | Resp 16 | Ht 60.0 in | Wt 140.1 lb

## 2019-10-07 DIAGNOSIS — R002 Palpitations: Secondary | ICD-10-CM | POA: Diagnosis not present

## 2019-10-07 DIAGNOSIS — R55 Syncope and collapse: Secondary | ICD-10-CM

## 2019-10-07 LAB — CBC WITH DIFFERENTIAL/PLATELET
Basophils Absolute: 0.1 10*3/uL (ref 0.0–0.1)
Basophils Relative: 1.1 % (ref 0.0–3.0)
Eosinophils Absolute: 0.2 10*3/uL (ref 0.0–0.7)
Eosinophils Relative: 2.9 % (ref 0.0–5.0)
HCT: 40.3 % (ref 36.0–46.0)
Hemoglobin: 13.1 g/dL (ref 12.0–15.0)
Lymphocytes Relative: 15.8 % (ref 12.0–46.0)
Lymphs Abs: 1.1 10*3/uL (ref 0.7–4.0)
MCHC: 32.5 g/dL (ref 30.0–36.0)
MCV: 92.8 fl (ref 78.0–100.0)
Monocytes Absolute: 0.8 10*3/uL (ref 0.1–1.0)
Monocytes Relative: 12.3 % — ABNORMAL HIGH (ref 3.0–12.0)
Neutro Abs: 4.7 10*3/uL (ref 1.4–7.7)
Neutrophils Relative %: 67.9 % (ref 43.0–77.0)
Platelets: 233 10*3/uL (ref 150.0–400.0)
RBC: 4.35 Mil/uL (ref 3.87–5.11)
RDW: 13.4 % (ref 11.5–15.5)
WBC: 6.9 10*3/uL (ref 4.0–10.5)

## 2019-10-07 LAB — TSH: TSH: 1.63 u[IU]/mL (ref 0.35–4.50)

## 2019-10-07 NOTE — Progress Notes (Signed)
Subjective:    Patient ID: Emily Thompson, female    DOB: 1945-12-24, 74 y.o.   MRN: [id number]  DOS:  10/07/2019 Type of visit - description: Acute Had 2 episodes of near syncope: 3-week ago and last week. She felt like she was about to pass out while resting, she quickly got up and started walking and symptoms passed. Is hard for her to describe how she felt, lightheaded? At the time did not have palpitation, chest pain, difficulty breathing.  No nausea or vomiting.  No diaphoresis. There was no headache or strokelike symptoms. No bladder or bowel incontinence, no unusual movements.  In addition, she has her "normal" palpitation on and off, this is recurrent problem and stable over time. 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  Has a Jodelle Red that she uses from time to time no atrial fibrillation reported.  Review of Systems See above Not taking any new medications  Past Medical History:  Diagnosis Date  . BCC (basal cell carcinoma of skin) 2013, 2016  . Breast cancer ([place]) 2003   chemo, radiation, lumpectomy, reconstructive surgery   . Complication of anesthesia   . Dyslipidemia   . Endometrial cancer ([place])   . GERD (gastroesophageal reflux disease)   . Hypertension   . Melanoma ([place]) 2012   righ side face; basal cell (nose)   . Osteopenia   . PONV (postoperative nausea and vomiting)   . SVT (supraventricular tachycardia) ([facility])    used to see Dr Rex Kras     Past Surgical History:  Procedure Laterality Date  . ABDOMINAL HYSTERECTOMY  2016  . BASAL CELL CARCINOMA EXCISION    . BREAST LUMPECTOMY Left 07/2001  . BREAST RECONSTRUCTION  2005  . [place]  . COLONOSCOPY W/ POLYPECTOMY  2013   NEXT DUE IN 5 YRS  . NM MYOCAR PERF WALL MOTION  01/2006   bruce myoview; no evidence of inducible ischemia, anterior wall thinning without evidence of ischemia; post-stress EF 88%; low  risk scan   . NOSE SURGERY     basal cell carcinoma removed  . ROBOTIC ASSISTED TOTAL HYSTERECTOMY WITH BILATERAL SALPINGO OOPHERECTOMY Bilateral 01/27/2015   Procedure: ROBOTIC ASSISTED TOTAL HYSTERECTOMY WITH BILATERAL SALPINGO OOPHORECTOMY AND SENTINAL NODE BIOPSY;  Surgeon: Everitt Amber, MD;  Location: [facility];  Service: Gynecology;  Laterality: Bilateral;  . SKIN CANCER EXCISION     melanoma  . TONSILLECTOMY AND ADENOIDECTOMY  1952  . TRANSTHORACIC ECHOCARDIOGRAM  12/2005   EF 50-55%; mild MR; trace TR  . TUBAL LIGATION  1978  . VULVA /PERINEUM BIOPSY     PAPILLOMA    Allergies as of 10/07/2019      Reactions   Dextromethorphan    Per pt, increases BP   Hydrocodone    Wires her up SEVERE NAUSEA   Statins    Headache, hot flashes   Morphine And Related    Could not move SEVERE NAUSEA   Tape Rash      Medication List       Accurate as of Oct 07, 2019  8:42 AM. If you have any questions, ask your nurse or doctor.        aspirin 81 MG tablet Take 81 mg by mouth daily.   betamethasone valerate ointment 0.1 % Commonly known as: VALISONE Use twice daily as needed   CALCIUM-VITAMIN D-VITAMIN K PO Take 1,200 mg by mouth  daily.   cholecalciferol 1000 units tablet Commonly known as: VITAMIN D Take 1,000 Units by mouth daily.   diltiazem 240 MG 24 hr capsule Commonly known as: TIAZAC Take 1 capsule (240 mg total) by mouth daily.   ezetimibe 10 MG tablet Commonly known as: ZETIA TAKE 1 TABLET BY MOUTH EVERY DAY   hydrochlorothiazide 25 MG tablet Commonly known as: HYDRODIURIL Take 1 tablet (25 mg total) by mouth daily.   ibandronate 150 MG tablet Commonly known as: Boniva Take 1 tablet (150 mg total) by mouth every 30 (thirty) days. Take in am with a full glass of water,  do not  lie down for the next 30 min.   lansoprazole 15 MG capsule Commonly known as: PREVACID Take 15 mg by mouth daily at 12 noon.   meloxicam 15 MG tablet Commonly known as: MOBIC Take 1  tablet (15 mg total) by mouth daily as needed for pain.   nystatin cream Commonly known as: MYCOSTATIN Apply 1 application topically 2 (two) times daily. What changed:   when to take this  reasons to take this   PRESERVISION AREDS 2 PO Take 1 capsule by mouth daily.   telmisartan 40 MG tablet Commonly known as: MICARDIS Take 1 tablet (40 mg total) by mouth daily.          Objective:   Physical Exam BP (!) 145/67 (BP Location: Left Arm, Patient Position: Sitting, Cuff Size: Small)   Pulse 71   Temp (!) 95.9 F (35.5 C) (Temporal)   Resp 16   Ht 5' (1.524 m)   Wt 140 lb 2 oz (63.6 kg)   LMP 05/14/1997   SpO2 99%   BMI 27.37 kg/m  General:   Well developed, NAD, BMI noted. HEENT:  Normocephalic . Face symmetric, atraumatic Neck: Normal carotid pulses Lungs:  CTA B Normal respiratory effort, no intercostal retractions, no accessory muscle use. Heart: RRR,  no murmur.  Lower extremities: no pretibial edema bilaterally  Skin: Not pale. Not jaundice Neurologic:  alert & oriented X3.  Speech normal, gait appropriate for age and unassisted Psych--  Cognition and judgment appear intact.  Cooperative with normal attention span and concentration.  Behavior appropriate. No anxious or depressed appearing.      Assessment       Assessment HTN Hyperlipidemia -- statin intolerant  GERD Osteopenia --dexa 2016 (per gyn); rx boniva 05-2017 @ gyn Insomnia  MSK: ---DJD,  NSAIDs prn --Back pain, --Dr Tommie Raymond ~ 2011, saw the spine center ~ 2015 , dx w/  spinal stenosis (no MRI, clinical dx ) --2017-2018: PT, injections Dr Catha Brow Cough-- hycodan prn (tolerates well small doses hydrocodone) Oncology: --Breast cancer 2003 --Endometrial cancer, H-BSO 01/2015, will see hematology x 5 years (gyn-onc) --Melanoma, BCC (Dr Delman Cheadle) Lung nodule, 31mm, per CTs, last CT 09/30/2015 : stable  H/o SVT  HOH   PLAN: Near syncope: 2 episodes, as described above, different from  her "normal" palpitations. EKG today NSR, unchanged from previous. Etiology not completely clear, brady arrhythmia? We agreed on checking CBC, TSH & refer to cardiology Palpitations, history of SVT: On CCB's, symptoms are recurrent and stable. Ambulatory BPs: are normal, heart rate usually 60 and 80.  Continue CCB's. Anxiety: This time of the year is somewhat stressful to her, is anniversary of the loss of her husband however she is not sure if that is playing any role on her symptoms.     This visit occurred during the SARS-CoV-2 public health emergency.  Safety protocols  were in place, including screening questions prior to the visit, additional usage of staff PPE, and extensive cleaning of exam room while observing appropriate contact time as indicated for disinfecting solutions.

## 2019-10-07 NOTE — Patient Instructions (Addendum)
Please schedule Medicare Wellness with Glenard Haring.   Continue checking your blood pressure and heart rate BP GOAL is between 110/65 and  135/85. If it is consistently higher or lower, let me know  If you have severe symptoms please let me know or go to the emergency room  GO TO THE LAB : Get the blood work

## 2019-10-07 NOTE — Progress Notes (Signed)
Pre visit review using our clinic review tool, if applicable. No additional management support is needed unless otherwise documented below in the visit note. 

## 2019-10-08 NOTE — Assessment & Plan Note (Signed)
Near syncope: 2 episodes, as described above, different from her "normal" palpitations. EKG today NSR, unchanged from previous. Etiology not completely clear, brady arrhythmia? We agreed on checking CBC, TSH & refer to cardiology Palpitations, history of SVT: On CCB's, symptoms are recurrent and stable. Ambulatory BPs: are normal, heart rate usually 60 and 80.  Continue CCB's. Anxiety: This time of the year is somewhat stressful to her, is anniversary of the loss of her husband however she is not sure if that is playing any role on her symptoms.

## 2019-10-26 ENCOUNTER — Ambulatory Visit: Payer: PPO | Admitting: Cardiology

## 2019-10-26 ENCOUNTER — Encounter: Payer: Self-pay | Admitting: Cardiology

## 2019-10-26 ENCOUNTER — Other Ambulatory Visit: Payer: Self-pay

## 2019-10-26 VITALS — BP 136/78 | HR 74 | Ht 60.0 in | Wt 148.0 lb

## 2019-10-26 DIAGNOSIS — E782 Mixed hyperlipidemia: Secondary | ICD-10-CM

## 2019-10-26 DIAGNOSIS — R55 Syncope and collapse: Secondary | ICD-10-CM

## 2019-10-26 DIAGNOSIS — R002 Palpitations: Secondary | ICD-10-CM | POA: Diagnosis not present

## 2019-10-26 DIAGNOSIS — I10 Essential (primary) hypertension: Secondary | ICD-10-CM | POA: Diagnosis not present

## 2019-10-26 HISTORY — DX: Syncope and collapse: R55

## 2019-10-26 NOTE — Progress Notes (Signed)
Cardiology Consultation:    Date:  10/26/2019   ID:  Emily Thompson, DOB [birth year], MRN [id number]  PCP:  Colon Branch, MD  Cardiologist:  Jenne Campus, MD   Referring MD: Colon Branch, MD   Chief Complaint  Patient presents with  . New Patient (Initial Visit)    History of Present Illness:    Emily Thompson is a 74 y.o. female who is being seen today for the evaluation of syncope and palpitations at the request of Colon Branch, MD.  Past medical history significant for dyslipidemia, intolerance to statin, breast CA, essential hypertension.  She is referred to [place] because for years she has been experiencing palpitations.  What she means by palpitations the fact that she can feel her heart stopping sometimes strong powerful thumping her chest.  However those episodes recently became much more frequent she said that 3 months of the year in April may usually very difficult for her.  This is the anniversary of her husband passing as well as the anniversary of the wedding and is very traumatic to her and stressful.  However this year is very unusual because of episode of near syncope.  What she means by that is the fact that she can be sitting and looking at something and then she got this momentary sensation that she feels like she is not completely passed out.  Luckily she never completely passed out.  She got one episode when she was standing and she felt she is, fell down she went and lean against a tree and couple seconds later she was fine.  There was no sweating there was no nausea associated with this sensation there is no palpitations.  But those episodes are quite worrisome.  She is trying to be active and she exercise on a regular basis she does yoga she does some exercises in which strength building exercises as well as she walks on a regular basis she has no difficulty doing it.  Denies have any chest pain tightness squeezing pressure burning chest. She does not have family  history of premature coronary artery disease  Past Medical History:  Diagnosis Date  . BCC (basal cell carcinoma of skin) 2013, 2016  . Breast cancer ([place]) 2003   chemo, radiation, lumpectomy, reconstructive surgery   . Complication of anesthesia   . Dyslipidemia   . Endometrial cancer ([place])   . GERD (gastroesophageal reflux disease)   . Hypertension   . Melanoma ([place]) 2012   righ side face; basal cell (nose)   . Osteopenia   . PONV (postoperative nausea and vomiting)   . SVT (supraventricular tachycardia) (HCC)    used to see Dr Rex Kras     Past Surgical History:  Procedure Laterality Date  . ABDOMINAL HYSTERECTOMY  2016  . BASAL CELL CARCINOMA EXCISION    . BREAST LUMPECTOMY Left 07/2001  . BREAST RECONSTRUCTION  2005  . [place]  . COLONOSCOPY W/ POLYPECTOMY  2013   NEXT DUE IN 5 YRS  . NM MYOCAR PERF WALL MOTION  01/2006   bruce myoview; no evidence of inducible ischemia, anterior wall thinning without evidence of ischemia; post-stress EF 88%; low risk scan   . NOSE SURGERY     basal cell carcinoma removed  . ROBOTIC ASSISTED TOTAL HYSTERECTOMY WITH BILATERAL SALPINGO OOPHERECTOMY Bilateral 01/27/2015   Procedure: ROBOTIC ASSISTED TOTAL HYSTERECTOMY WITH BILATERAL SALPINGO OOPHORECTOMY AND SENTINAL NODE BIOPSY;  Surgeon: Everitt Amber, MD;  Location: [facility];  Service: Gynecology;  Laterality: Bilateral;  . SKIN CANCER EXCISION     melanoma  . TONSILLECTOMY AND ADENOIDECTOMY  1952  . TRANSTHORACIC ECHOCARDIOGRAM  12/2005   EF 50-55%; mild MR; trace TR  . TUBAL LIGATION  1978  . VULVA /PERINEUM BIOPSY     PAPILLOMA    Current Medications: Current Meds  Medication Sig  . aspirin 81 MG tablet Take 81 mg by mouth daily.   Marland Kitchen CALCIUM-VITAMIN D-VITAMIN K PO Take 1,200 mg by mouth daily.  . cholecalciferol (VITAMIN D) 1000 units tablet Take 1,000 Units by mouth daily.  Marland Kitchen diltiazem (TIAZAC) 240 MG 24 hr capsule Take 1 capsule (240 mg total) by mouth daily.  Marland Kitchen  ezetimibe (ZETIA) 10 MG tablet TAKE 1 TABLET BY MOUTH EVERY DAY  . hydrochlorothiazide (HYDRODIURIL) 25 MG tablet Take 1 tablet (25 mg total) by mouth daily.  Marland Kitchen ibandronate (BONIVA) 150 MG tablet Take 1 tablet (150 mg total) by mouth every 30 (thirty) days. Take in am with a full glass of water,  do not  lie down for the next 30 min.  . lansoprazole (PREVACID) 15 MG capsule Take 15 mg by mouth daily at 12 noon.  . meloxicam (MOBIC) 15 MG tablet Take 1 tablet (15 mg total) by mouth daily as needed for pain.  . Multiple Vitamins-Minerals (PRESERVISION AREDS 2 PO) Take 1 capsule by mouth daily.  Marland Kitchen telmisartan (MICARDIS) 40 MG tablet Take 1 tablet (40 mg total) by mouth daily.     Allergies:   Dextromethorphan, Hydrocodone, Statins, Morphine and related, and Tape   Social History   Socioeconomic History  . Marital status: Widowed    Spouse name: Not on file  . Number of children: 2  . Years of education: BSN  . Highest education level: Not on file  Occupational History  . 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 Smoking status: Former Smoker    Years: 10.00    Types: Cigarettes    Quit date: 05/20/1973    Years since quitting: 46.4  . Smokeless tobacco: Never Used  Vaping Use  . Vaping Use: Never used  Substance and Sexual Activity  . Alcohol use: Yes    Alcohol/week: 4.0 standard drinks    Types: 2 Glasses of wine, 2 Cans of beer per week    Comment: 4 A WEEK  . Drug use: No  . Sexual activity: Not Currently    Birth control/protection: Post-menopausal  Other Topics Concern  . Not on file  Social History Narrative   Widow, 1 adopted and 1 natural child; ([place], [place])   Live by herself   Social Determinants of Radio broadcast assistant Strain:   . Difficulty of Paying Living Expenses:   Food Insecurity:   . Worried About Charity fundraiser in the Last Year:   . Arboriculturist in the Last Year:   Transportation  Needs:   . Film/video editor (Medical):   Marland Kitchen Lack of Transportation (Non-Medical):   Physical Activity:   . Days of Exercise per Week:   . Minutes of Exercise per Session:   Stress:   . Feeling of Stress :   Social Connections:   . Frequency of Communication with Friends and Family:   . Frequency of Social Gatherings with Friends and Family:   . Attends Religious Services:   . Active Member of Clubs or Organizations:   .  Attends Archivist Meetings:   Marland Kitchen Marital Status:      Family History: The patient's family history includes Bipolar disorder in her brother; Colon cancer in her maternal aunt; Diabetes in her brother; Hypertension in her father and mother; Stroke in her father; Thyroid disease in her mother. There is no history of Breast cancer. ROS:   Please see the history of present illness.    All 14 point review of systems negative except as described per history of present illness.  EKGs/Labs/Other Studies Reviewed:    The following studies were reviewed today:   EKG:  EKG is  ordered today.  The ekg ordered today demonstrates   Recent Labs: 08/01/2019: ALT 23; BUN 16; Creatinine, Ser 0.78; Potassium 4.2; Sodium 143 10/07/2019: Hemoglobin 13.1; Platelets 233.0; TSH 1.63  Recent Lipid Panel    Component Value Date/Time   CHOL 199 01/21/2019 0851   TRIG 90.0 01/21/2019 0851   HDL 60.90 01/21/2019 0851   CHOLHDL 3 01/21/2019 0851   VLDL 18.0 01/21/2019 0851   LDLCALC 120 (H) 01/21/2019 0851   LDLDIRECT 114.7 07/28/2012 0823    Physical Exam:    VS:  BP 136/78   Pulse 74   Ht 5' (1.524 m)   Wt 148 lb (67.1 kg)   LMP 05/14/1997   SpO2 98%   BMI 28.90 kg/m     Wt Readings from Last 3 Encounters:  10/26/19 148 lb (67.1 kg)  10/07/19 140 lb 2 oz (63.6 kg)  08/01/19 148 lb (67.1 kg)     GEN:  Well nourished, well developed in no acute distress HEENT: Normal NECK: No JVD; No carotid bruits LYMPHATICS: No lymphadenopathy CARDIAC: RRR, no  murmurs, no rubs, no gallops RESPIRATORY:  Clear to auscultation without rales, wheezing or rhonchi  ABDOMEN: Soft, non-tender, non-distended MUSCULOSKELETAL:  No edema; No deformity  SKIN: Warm and dry NEUROLOGIC:  Alert and oriented x 3 PSYCHIATRIC:  Normal affect   ASSESSMENT:    1. Palpitations   2. Mixed hyperlipidemia   3. Essential hypertension   4. Near syncope    PLAN:    In order of problems listed above:  1. Palpitations: I will ask you to wear Zio patch for 2 weeks to see exactly what kind of arrhythmia with dealing with.  She does have apple watch but old model I advised him to get a new 1 month so she can record EKG if she got some palpitations.  She described one episode when she woke up in the middle of the night and her heart was flying.  Apple watch will be beneficial to record it.  As a part of evaluation I will ask her to have an echocardiogram to assess left ventricle ejection fraction. 2. Mixed dyslipidemia she does have intolerance to multiple statin.  She is taking Zetia which I will continue.  In the future we will reevaluate this issue. 3. Essential hypertension blood pressure seems to be well controlled. 4. Near syncope with some worrisome circumstances.  Zio patch will be used to [facility] in the future we may even consider implantable loop recorder depends of situation will involve some part of evaluation echocardiogram will be done.   Medication Adjustments/Labs and Tests Ordered: Current medicines are reviewed at length with the patient today.  Concerns regarding medicines are outlined above.  No orders of the defined types were placed in this encounter.  No orders of the defined types were placed in this encounter.   Signed, Marily Lente.  Agustin Cree, MD, [facility]. 10/26/2019 3:19 PM    [facility]

## 2019-10-26 NOTE — Patient Instructions (Signed)
Medication Instructions:  Your physician recommends that you continue on your current medications as directed. Please refer to the Current Medication list given to you today.  *If you need a refill on your cardiac medications before your next appointment, please call your pharmacy*   Lab Work: None.  If you have labs (blood work) drawn today and your tests are completely normal, you will receive your results only by:  [place] (if you have MyChart) OR  A paper copy in the mail If you have any lab test that is abnormal or we need to change your treatment, we will call you to review the results.   Testing/Procedures: Your physician has requested that you have an echocardiogram. Echocardiography is a painless test that uses sound waves to create images of your heart. It provides your doctor with information about the size and shape of your heart and how well your hearts chambers and valves are working. This procedure takes approximately one hour. There are no restrictions for this procedure.  A zio monitor was ordered today. It will remain on for 14 days. You will then return monitor and event diary in provided box. It takes 1-2 weeks for report to be downloaded and returned to [place]. We will call you with the results. If monitor falls off or has orange flashing light, please call Zio for further instructions.      Follow-Up: At [facility], you and your health needs are our priority.  As part of our continuing mission to provide you with exceptional heart care, we have created designated Provider Care Teams.  These Care Teams include your primary Cardiologist (physician) and Advanced Practice Providers (APPs -  Physician Assistants and Nurse Practitioners) who all work together to provide you with the care you need, when you need it.  We recommend signing up for the patient portal called "MyChart".  Sign up information is provided on this After Visit Summary.  MyChart is used to  connect with patients for Virtual Visits (Telemedicine).  Patients are able to view lab/test results, encounter notes, upcoming appointments, etc.  Non-urgent messages can be sent to your provider as well.   To learn more about what you can do with MyChart, go to [email or web address].    Your next appointment:   6 week(s)  The format for your next appointment:   In Person  Provider:   Jenne Campus, MD   Other Instructions   Echocardiogram An echocardiogram is a procedure that uses painless sound waves (ultrasound) to produce an image of the heart. Images from an echocardiogram can provide important information about:  Signs of coronary artery disease (CAD).  Aneurysm detection. An aneurysm is a weak or damaged part of an artery wall that bulges out from the normal force of blood pumping through the body.  Heart size and shape. Changes in the size or shape of the heart can be associated with certain conditions, including heart failure, aneurysm, and CAD.  Heart muscle function.  Heart valve function.  Signs of a past heart attack.  Fluid buildup around the heart.  Thickening of the heart muscle.  A tumor or infectious growth around the heart valves. Tell a health care provider about:  Any allergies you have.  All medicines you are taking, including vitamins, herbs, eye drops, creams, and over-the-counter medicines.  Any blood disorders you have.  Any surgeries you have had.  Any medical conditions you have.  Whether you are pregnant or may be pregnant. What are the risks?  Generally, this is a safe procedure. However, problems may occur, including:  Allergic reaction to dye (contrast) that may be used during the procedure. What happens before the procedure? No specific preparation is needed. You may eat and drink normally. What happens during the procedure?   An IV tube may be inserted into one of your veins.  You may receive contrast through this  tube. A contrast is an injection that improves the quality of the pictures from your heart.  A gel will be applied to your chest.  A wand-like tool (transducer) will be moved over your chest. The gel will help to transmit the sound waves from the transducer.  The sound waves will harmlessly bounce off of your heart to allow the heart images to be captured in real-time motion. The images will be recorded on a computer. The procedure may vary among health care providers and hospitals. What happens after the procedure?  You may return to your normal, everyday life, including diet, activities, and medicines, unless your health care provider tells you not to do that. Summary  An echocardiogram is a procedure that uses painless sound waves (ultrasound) to produce an image of the heart.  Images from an echocardiogram can provide important information about the size and shape of your heart, heart muscle function, heart valve function, and fluid buildup around your heart.  You do not need to do anything to prepare before this procedure. You may eat and drink normally.  After the echocardiogram is completed, you may return to your normal, everyday life, unless your health care provider tells you not to do that. This information is not intended to replace advice given to you by your health care provider. Make sure you discuss any questions you have with your health care provider. Document Revised: 08/21/2018 Document Reviewed: 06/02/2016 Elsevier Patient Education  [employer].

## 2019-10-28 ENCOUNTER — Ambulatory Visit (INDEPENDENT_AMBULATORY_CARE_PROVIDER_SITE_OTHER): Payer: PPO

## 2019-10-28 DIAGNOSIS — R002 Palpitations: Secondary | ICD-10-CM | POA: Diagnosis not present

## 2019-10-28 DIAGNOSIS — R55 Syncope and collapse: Secondary | ICD-10-CM | POA: Diagnosis not present

## 2019-10-29 ENCOUNTER — Other Ambulatory Visit: Payer: Self-pay | Admitting: Internal Medicine

## 2019-10-29 ENCOUNTER — Other Ambulatory Visit: Payer: Self-pay

## 2019-10-29 ENCOUNTER — Ambulatory Visit (HOSPITAL_COMMUNITY)
Admission: RE | Admit: 2019-10-29 | Discharge: 2019-10-29 | Disposition: A | Discharge status: Home / Self Care | Payer: PPO | Source: Ambulatory Visit | Attending: Cardiology | Admitting: Cardiology

## 2019-10-29 DIAGNOSIS — I119 Hypertensive heart disease without heart failure: Secondary | ICD-10-CM | POA: Insufficient documentation

## 2019-10-29 DIAGNOSIS — R55 Syncope and collapse: Secondary | ICD-10-CM

## 2019-10-29 DIAGNOSIS — I471 Supraventricular tachycardia: Secondary | ICD-10-CM | POA: Diagnosis not present

## 2019-10-29 DIAGNOSIS — E785 Hyperlipidemia, unspecified: Secondary | ICD-10-CM | POA: Insufficient documentation

## 2019-10-29 DIAGNOSIS — Z87891 Personal history of nicotine dependence: Secondary | ICD-10-CM | POA: Diagnosis not present

## 2019-10-29 DIAGNOSIS — Z853 Personal history of malignant neoplasm of breast: Secondary | ICD-10-CM | POA: Diagnosis not present

## 2019-10-29 DIAGNOSIS — R002 Palpitations: Secondary | ICD-10-CM | POA: Insufficient documentation

## 2019-10-29 NOTE — Progress Notes (Signed)
  Echocardiogram 2D Echocardiogram has been performed.  Emily Thompson 10/29/2019, 11:48 AM

## 2019-11-27 DIAGNOSIS — R002 Palpitations: Secondary | ICD-10-CM | POA: Diagnosis not present

## 2019-12-08 ENCOUNTER — Other Ambulatory Visit: Payer: Self-pay

## 2019-12-15 ENCOUNTER — Ambulatory Visit: Payer: PPO | Admitting: Cardiology

## 2020-01-02 ENCOUNTER — Encounter: Payer: Self-pay | Admitting: Nurse Practitioner

## 2020-01-02 DIAGNOSIS — Z1231 Encounter for screening mammogram for malignant neoplasm of breast: Secondary | ICD-10-CM | POA: Diagnosis not present

## 2020-01-14 DIAGNOSIS — L821 Other seborrheic keratosis: Secondary | ICD-10-CM | POA: Diagnosis not present

## 2020-01-14 DIAGNOSIS — Z85828 Personal history of other malignant neoplasm of skin: Secondary | ICD-10-CM | POA: Diagnosis not present

## 2020-01-14 DIAGNOSIS — C4441 Basal cell carcinoma of skin of scalp and neck: Secondary | ICD-10-CM | POA: Diagnosis not present

## 2020-01-14 DIAGNOSIS — D485 Neoplasm of uncertain behavior of skin: Secondary | ICD-10-CM | POA: Diagnosis not present

## 2020-01-14 DIAGNOSIS — L578 Other skin changes due to chronic exposure to nonionizing radiation: Secondary | ICD-10-CM | POA: Diagnosis not present

## 2020-01-14 DIAGNOSIS — D225 Melanocytic nevi of trunk: Secondary | ICD-10-CM | POA: Diagnosis not present

## 2020-01-14 DIAGNOSIS — L570 Actinic keratosis: Secondary | ICD-10-CM | POA: Diagnosis not present

## 2020-01-14 DIAGNOSIS — Z86006 Personal history of melanoma in-situ: Secondary | ICD-10-CM | POA: Diagnosis not present

## 2020-01-15 ENCOUNTER — Encounter: Payer: Self-pay | Admitting: Cardiology

## 2020-01-15 ENCOUNTER — Ambulatory Visit: Payer: PPO | Admitting: Cardiology

## 2020-01-15 ENCOUNTER — Other Ambulatory Visit: Payer: Self-pay

## 2020-01-15 VITALS — BP 130/78 | HR 76 | Ht 60.0 in | Wt 150.0 lb

## 2020-01-15 DIAGNOSIS — R55 Syncope and collapse: Secondary | ICD-10-CM | POA: Diagnosis not present

## 2020-01-15 DIAGNOSIS — E782 Mixed hyperlipidemia: Secondary | ICD-10-CM | POA: Diagnosis not present

## 2020-01-15 DIAGNOSIS — R002 Palpitations: Secondary | ICD-10-CM

## 2020-01-15 DIAGNOSIS — I10 Essential (primary) hypertension: Secondary | ICD-10-CM | POA: Diagnosis not present

## 2020-01-15 NOTE — Patient Instructions (Signed)

## 2020-01-15 NOTE — Progress Notes (Signed)
Cardiology Office Note:    Date:  01/15/2020   ID:  Emily Thompson, DOB Sep 08, 1945, MRN [id number]  PCP:  Colon Branch, MD  Cardiologist:  Jenne Campus, MD    Referring MD: Colon Branch, MD   Chief Complaint  Patient presents with  . Follow-up  Doing much better  History of Present Illness:    Emily Thompson is a 74 y.o. female with past medical history significant for essential hypertension, dyslipidemia with intolerance to statin, she was sent to [place] because of episode of palpitation as well as near syncope.  Work-up has been negative except showing some extrasystole otherwise echocardiogram showed preserved left ventricle ejection fraction.  She does have mild increased septal wall but not significant enough to get concerned about her cardiomyopathy.  Since have seen her last time she is doing much better.  At the time she had this palpitation she had a lot of stressful situation at home now is much better.  Past Medical History:  Diagnosis Date  . BCC (basal cell carcinoma of skin) 2013, 2016  . Breast cancer ([place]) 2003   chemo, radiation, lumpectomy, reconstructive surgery   . Complication of anesthesia   . Dyslipidemia   . Endometrial cancer ([place])   . GERD (gastroesophageal reflux disease)   . Hypertension   . Melanoma ([place]) 2012   righ side face; basal cell (nose)   . Osteopenia   . PONV (postoperative nausea and vomiting)   . SVT (supraventricular tachycardia) (HCC)    used to see Dr Rex Kras     Past Surgical History:  Procedure Laterality Date  . ABDOMINAL HYSTERECTOMY  2016  . BASAL CELL CARCINOMA EXCISION    . BREAST LUMPECTOMY Left 07/2001  . BREAST RECONSTRUCTION  2005  . [place]  . COLONOSCOPY W/ POLYPECTOMY  2013   NEXT DUE IN 5 YRS  . NM MYOCAR PERF WALL MOTION  01/2006   bruce myoview; no evidence of inducible ischemia, anterior wall thinning without evidence of ischemia; post-stress EF 88%; low risk scan   . NOSE SURGERY      basal cell carcinoma removed  . ROBOTIC ASSISTED TOTAL HYSTERECTOMY WITH BILATERAL SALPINGO OOPHERECTOMY Bilateral 01/27/2015   Procedure: ROBOTIC ASSISTED TOTAL HYSTERECTOMY WITH BILATERAL SALPINGO OOPHORECTOMY AND SENTINAL NODE BIOPSY;  Surgeon: Everitt Amber, MD;  Location: [facility];  Service: Gynecology;  Laterality: Bilateral;  . SKIN CANCER EXCISION     melanoma  . TONSILLECTOMY AND ADENOIDECTOMY  1952  . TRANSTHORACIC ECHOCARDIOGRAM  12/2005   EF 50-55%; mild MR; trace TR  . TUBAL LIGATION  1978  . VULVA /PERINEUM BIOPSY     PAPILLOMA    Current Medications: Current Meds  Medication Sig  . aspirin 81 MG tablet Take 81 mg by mouth daily.   Marland Kitchen CALCIUM-VITAMIN D-VITAMIN K PO Take 1,200 mg by mouth daily.  . cholecalciferol (VITAMIN D) 1000 units tablet Take 1,000 Units by mouth daily.  Marland Kitchen diltiazem (TIADYLT ER) 240 MG 24 hr capsule Take 1 capsule (240 mg total) by mouth daily.  Marland Kitchen ezetimibe (ZETIA) 10 MG tablet TAKE 1 TABLET BY MOUTH EVERY DAY  . hydrochlorothiazide (HYDRODIURIL) 25 MG tablet Take 1 tablet (25 mg total) by mouth daily.  Marland Kitchen ibandronate (BONIVA) 150 MG tablet Take 1 tablet (150 mg total) by mouth every 30 (thirty) days. Take in am with a full glass of water,  do not  lie down for the next 30 min.  . lansoprazole (PREVACID) 15  MG capsule Take 15 mg by mouth daily at 12 noon.  . meloxicam (MOBIC) 15 MG tablet Take 1 tablet (15 mg total) by mouth daily as needed for pain.  . Multiple Vitamins-Minerals (PRESERVISION AREDS 2 PO) Take 1 capsule by mouth daily.  Marland Kitchen telmisartan (MICARDIS) 40 MG tablet Take 1 tablet (40 mg total) by mouth daily.     Allergies:   Dextromethorphan, Hydrocodone, Statins, Morphine and related, and Tape   Social History   Socioeconomic History  . Marital status: Widowed    Spouse name: Not on file  . Number of children: 2  . Years of education: BSN  . Highest education level: Not on file  Occupational History  . 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 Smoking status: Former Smoker    Years: 10.00    Types: Cigarettes    Quit date: 05/20/1973    Years since quitting: 46.6  . Smokeless tobacco: Never Used  Vaping Use  . Vaping Use: Never used  Substance and Sexual Activity  . Alcohol use: Yes    Alcohol/week: 4.0 standard drinks    Types: 2 Glasses of wine, 2 Cans of beer per week    Comment: 4 A WEEK  . Drug use: No  . Sexual activity: Not Currently    Birth control/protection: Post-menopausal  Other Topics Concern  . Not on file  Social History Narrative   Widow, 1 adopted and 1 natural child; ([place], [place])   Live by herself   Social Determinants of Health   Financial Resource Strain:   . Difficulty of Paying Living Expenses: Not on file  Food Insecurity:   . Worried About Charity fundraiser in the Last Year: Not on file  . Ran Out of Food in the Last Year: Not on file  Transportation Needs:   . Lack of Transportation (Medical): Not on file  . Lack of Transportation (Non-Medical): Not on file  Physical Activity:   . Days of Exercise per Week: Not on file  . Minutes of Exercise per Session: Not on file  Stress:   . Feeling of Stress : Not on file  Social Connections:   . Frequency of Communication with Friends and Family: Not on file  . Frequency of Social Gatherings with Friends and Family: Not on file  . Attends Religious Services: Not on file  . Active Member of Clubs or Organizations: Not on file  . Attends Archivist Meetings: Not on file  . Marital Status: Not on file     Family History: The patient's family history includes Bipolar disorder in her brother; Colon cancer in her maternal aunt; Diabetes in her brother; Hypertension in her father and mother; Stroke in her father; Thyroid disease in her mother. There is no history of Breast cancer. ROS:   Please see the history of present illness.    All 14 point review of systems negative except  as described per history of present illness  EKGs/Labs/Other Studies Reviewed:      Recent Labs: 08/01/2019: ALT 23; BUN 16; Creatinine, Ser 0.78; Potassium 4.2; Sodium 143 10/07/2019: Hemoglobin 13.1; Platelets 233.0; TSH 1.63  Recent Lipid Panel    Component Value Date/Time   CHOL 199 01/21/2019 0851   TRIG 90.0 01/21/2019 0851   HDL 60.90 01/21/2019 0851   CHOLHDL 3 01/21/2019 0851   VLDL 18.0 01/21/2019 0851   LDLCALC 120 (H) 01/21/2019 5993  LDLDIRECT 114.7 07/28/2012 0823    Physical Exam:    VS:  BP 130/78 (BP Location: Right Arm, Patient Position: Sitting, Cuff Size: Normal)   Pulse 76   Ht 5' (1.524 m)   Wt 150 lb (68 kg)   LMP 05/14/1997   SpO2 96%   BMI 29.29 kg/m     Wt Readings from Last 3 Encounters:  01/15/20 150 lb (68 kg)  10/26/19 148 lb (67.1 kg)  10/07/19 140 lb 2 oz (63.6 kg)     GEN:  Well nourished, well developed in no acute distress HEENT: Normal NECK: No JVD; No carotid bruits LYMPHATICS: No lymphadenopathy CARDIAC: RRR, no murmurs, no rubs, no gallops RESPIRATORY:  Clear to auscultation without rales, wheezing or rhonchi  ABDOMEN: Soft, non-tender, non-distended MUSCULOSKELETAL:  No edema; No deformity  SKIN: Warm and dry LOWER EXTREMITIES: no swelling NEUROLOGIC:  Alert and oriented x 3 PSYCHIATRIC:  Normal affect   ASSESSMENT:    1. Near syncope   2. Essential hypertension   3. Palpitations   4. Mixed hyperlipidemia    PLAN:    In order of problems listed above:  1. Near syncope doing well from that point review.  We will continue present management of her small dose of beta-blocker to suppress her extrasystole but she does not want to do it which is fine with me. 2. Essential hypertension: Blood pressure perfectly controlled continue present management. 3. Palpitations she refused my offer to take beta-blocker but I told her if testing start acting up again she need to let me know diminish a small dose of  metoprolol. 4. Mixed dyslipidemia she is allergic to statin.  I look at her LDL from last year which was 120 this is in K PN, I will ask her to have cholesterol repeated.   Medication Adjustments/Labs and Tests Ordered: Current medicines are reviewed at length with the patient today.  Concerns regarding medicines are outlined above.  No orders of the defined types were placed in this encounter.  Medication changes: No orders of the defined types were placed in this encounter.   Signed, Park Liter, MD, [facility] 01/15/2020 2:47 PM    [place]

## 2020-01-22 ENCOUNTER — Encounter: Payer: PPO | Admitting: Internal Medicine

## 2020-02-24 DIAGNOSIS — C4441 Basal cell carcinoma of skin of scalp and neck: Secondary | ICD-10-CM | POA: Diagnosis not present

## 2020-03-04 ENCOUNTER — Encounter: Payer: Self-pay | Admitting: Internal Medicine

## 2020-03-04 ENCOUNTER — Other Ambulatory Visit: Payer: Self-pay

## 2020-03-04 ENCOUNTER — Ambulatory Visit (INDEPENDENT_AMBULATORY_CARE_PROVIDER_SITE_OTHER): Payer: PPO | Admitting: Internal Medicine

## 2020-03-04 VITALS — BP 130/72 | HR 66 | Temp 98.1°F | Resp 18 | Ht 60.0 in | Wt 145.2 lb

## 2020-03-04 DIAGNOSIS — I10 Essential (primary) hypertension: Secondary | ICD-10-CM | POA: Diagnosis not present

## 2020-03-04 DIAGNOSIS — M159 Polyosteoarthritis, unspecified: Secondary | ICD-10-CM

## 2020-03-04 DIAGNOSIS — Z0000 Encounter for general adult medical examination without abnormal findings: Secondary | ICD-10-CM

## 2020-03-04 DIAGNOSIS — E782 Mixed hyperlipidemia: Secondary | ICD-10-CM | POA: Diagnosis not present

## 2020-03-04 DIAGNOSIS — R059 Cough, unspecified: Secondary | ICD-10-CM

## 2020-03-04 DIAGNOSIS — M8949 Other hypertrophic osteoarthropathy, multiple sites: Secondary | ICD-10-CM | POA: Diagnosis not present

## 2020-03-04 DIAGNOSIS — R55 Syncope and collapse: Secondary | ICD-10-CM | POA: Diagnosis not present

## 2020-03-04 MED ORDER — HYDROCHLOROTHIAZIDE 25 MG PO TABS: 25.0000 mg | ORAL_TABLET | Freq: Every day | ORAL | 1 refills | Status: DC

## 2020-03-04 MED ORDER — TELMISARTAN 40 MG PO TABS: 40.0000 mg | ORAL_TABLET | Freq: Every day | ORAL | 1 refills | Status: DC

## 2020-03-04 MED ORDER — HYDROCODONE-HOMATROPINE 5-1.5 MG/5ML PO SYRP: 5.0000 mL | ORAL_SOLUTION | Freq: Three times a day (TID) | ORAL | 0 refills | Status: AC | PRN

## 2020-03-04 MED ORDER — MELOXICAM 15 MG PO TABS: 15.0000 mg | ORAL_TABLET | Freq: Every day | ORAL | 0 refills | Status: DC | PRN

## 2020-03-04 NOTE — Progress Notes (Signed)
Pre visit review using our clinic review tool, if applicable. No additional management support is needed unless otherwise documented below in the visit note. 

## 2020-03-04 NOTE — Progress Notes (Signed)
Subjective:    Patient ID: Emily Thompson, female    DOB: [birth year], 74 y.o.   MRN: [id number]  DOS:  03/04/2020 Type of visit - description: CPX  Here for CPX, multiple other issues discussed. Notes from cardiology reviewed Request a refill on hydrocodone, cough syrup.  Review of Systems   A 14 point review of systems is negative    Past Medical History:  Diagnosis Date  . BCC (basal cell carcinoma of skin) 2013, 2016  . Breast cancer ([place]) 2003   chemo, radiation, lumpectomy, reconstructive surgery   . Complication of anesthesia   . Dyslipidemia   . Endometrial cancer ([place])   . GERD (gastroesophageal reflux disease)   . Hypertension   . Melanoma ([place]) 2012   righ side face; basal cell (nose)   . Osteopenia   . PONV (postoperative nausea and vomiting)   . SVT (supraventricular tachycardia) ([facility])    used to see Dr Rex Kras     Past Surgical History:  Procedure Laterality Date  . ABDOMINAL HYSTERECTOMY  2016  . BASAL CELL CARCINOMA EXCISION    . BREAST LUMPECTOMY Left 07/2001  . BREAST RECONSTRUCTION  2005  . [place]  . COLONOSCOPY W/ POLYPECTOMY  2013   NEXT DUE IN 5 YRS  . NM MYOCAR PERF WALL MOTION  01/2006   bruce myoview; no evidence of inducible ischemia, anterior wall thinning without evidence of ischemia; post-stress EF 88%; low risk scan   . NOSE SURGERY     basal cell carcinoma removed  . ROBOTIC ASSISTED TOTAL HYSTERECTOMY WITH BILATERAL SALPINGO OOPHERECTOMY Bilateral 01/27/2015   Procedure: ROBOTIC ASSISTED TOTAL HYSTERECTOMY WITH BILATERAL SALPINGO OOPHORECTOMY AND SENTINAL NODE BIOPSY;  Surgeon: Everitt Amber, MD;  Location: [facility];  Service: Gynecology;  Laterality: Bilateral;  . SKIN CANCER EXCISION     melanoma  . TONSILLECTOMY AND ADENOIDECTOMY  1952  . TRANSTHORACIC ECHOCARDIOGRAM  12/2005   EF 50-55%; mild MR; trace TR  . TUBAL LIGATION  1978  . VULVA /PERINEUM BIOPSY     PAPILLOMA    Allergies as of 03/04/2020       Reactions   Dextromethorphan    Per pt, increases BP   Hydrocodone    Wires her up SEVERE NAUSEA   Statins    Headache, hot flashes   Morphine And Related    Could not move SEVERE NAUSEA   Tape Rash      Medication List       Accurate as of March 04, 2020 11:59 PM. If you have any questions, ask your nurse or doctor.        aspirin 81 MG tablet Take 81 mg by mouth daily.   CALCIUM-VITAMIN D-VITAMIN K PO Take 1,200 mg by mouth daily.   cholecalciferol 1000 units tablet Commonly known as: VITAMIN D Take 1,000 Units by mouth daily.   diltiazem 240 MG 24 hr capsule Commonly known as: Tiadylt ER Take 1 capsule (240 mg total) by mouth daily.   ezetimibe 10 MG tablet Commonly known as: ZETIA TAKE 1 TABLET BY MOUTH EVERY DAY   hydrochlorothiazide 25 MG tablet Commonly known as: HYDRODIURIL Take 1 tablet (25 mg total) by mouth daily.   HYDROcodone-homatropine 5-1.5 MG/5ML syrup Commonly known as: HYCODAN Take 5 mLs by mouth every 8 (eight) hours as needed for up to 10 days for cough.   ibandronate 150 MG tablet Commonly known as: Boniva Take 1 tablet (150 mg total) by mouth every 30 (thirty)  days. Take in am with a full glass of water,  do not  lie down for the next 30 min.   lansoprazole 15 MG capsule Commonly known as: PREVACID Take 15 mg by mouth daily at 12 noon.   meloxicam 15 MG tablet Commonly known as: MOBIC Take 1 tablet (15 mg total) by mouth daily as needed for pain.   PRESERVISION AREDS 2 PO Take 1 capsule by mouth daily.   telmisartan 40 MG tablet Commonly known as: MICARDIS Take 1 tablet (40 mg total) by mouth daily.          Objective:   Physical Exam BP 130/72 (BP Location: Left Arm, Patient Position: Sitting, Cuff Size: Small)   Pulse 66   Temp 98.1 F (36.7 C) (Oral)   Resp 18   Ht 5' (1.524 m)   Wt 145 lb 4 oz (65.9 kg)   LMP 05/14/1997   SpO2 97%   BMI 28.37 kg/m  General: Well developed, NAD, BMI noted Neck: No   thyromegaly  HEENT:  Normocephalic . Face symmetric, atraumatic Lungs:  CTA B Normal respiratory effort, no intercostal retractions, no accessory muscle use. Heart: RRR,  no murmur.  Abdomen:  Not distended, soft, non-tender. No rebound or rigidity.   Lower extremities: no pretibial edema bilaterally  Skin: Exposed areas without rash. Not pale. Not jaundice Neurologic:  alert & oriented X3.  Speech normal, gait appropriate for age and unassisted Strength symmetric and appropriate for age.  Psych: Cognition and judgment appear intact.  Cooperative with normal attention span and concentration.  Behavior appropriate. No anxious or depressed appearing.     Assessment      Assessment HTN Hyperlipidemia -- statin intolerant  GERD Osteopenia --dexa 2016 (per gyn); rx boniva 05-2017 @ gyn Insomnia  MSK: ---DJD,  NSAIDs prn --Back pain, --Dr Tommie Raymond ~ 2011, saw the spine center ~ 2015 , dx w/  spinal stenosis (no MRI, clinical dx ) --2017-2018: PT, injections Dr Catha Brow Cough-- hycodan prn (tolerates well small doses hydrocodone) Oncology: --Breast cancer 2003 --Endometrial cancer, H-BSO 01/2015, will see hematology x 5 years (gyn-onc) --Melanoma, BCC (Dr Delman Cheadle) Lung nodule, 86mm, per CTs, last CT 09/30/2015 : stable  H/o SVT  HOH   PLAN: Here for CPX Near syncope Since the last visit, saw cardiology, echo showed preserved LV Fx, a long-term monitor showed symptomatic ectopies, they rec  low-dose beta-blockers to suppress extrasystoles but she declined b/c palpitations are short lived and they have not been severe enough to cause any more near syncope. HTN: Good ambulatory BPs, on diltiazem, HCTZ, Micardis.  RF sent High cholesterol: On Zetia, she would like to give it statins another try, very low-dose of Crestor?  We agreed to check labs and if needed at Crestor 5 mg 3 times a week. Osteopenia: Per gynecology Cough: Very seldom takes hydrocodone syrup, request a refill,  sent. DJD: Takes meloxicam as needed, RF sent.  Advised to take Tylenol at night, okay to take up to 1000 mg nightly. Aspirin: Due to new guidelines okay to stop, she is a little nervous about it, we agreed to continue for 1 more year. Oncology: Sees other providers RTC 6 months  In addition to CPX, I addressed with the patient a number of other symptoms are reviewed the chart regarding her syncope, HTN, high cholesterol, refill medications for cough, meloxicam, discussed the use of Tylenol and new recommendations regarding aspirin.    This visit occurred during the SARS-CoV-2 public health emergency.  Safety  protocols were in place, including screening questions prior to the visit, additional usage of staff PPE, and extensive cleaning of exam room while observing appropriate contact time as indicated for disinfecting solutions.  

## 2020-03-04 NOTE — Patient Instructions (Signed)
Check the  blood pressure regularly BP GOAL is between 110/65 and  135/85. If it is consistently higher or lower, let me know  I sent a prescription for your cough syrup that contains hydrocodone   GO TO THE LAB : Get the blood work     [place], [place] back for a checkup in 6 months

## 2020-03-05 LAB — COMPREHENSIVE METABOLIC PANEL
AG Ratio: 2 (calc) (ref 1.0–2.5)
ALT: 13 U/L (ref 6–29)
AST: 18 U/L (ref 10–35)
Albumin: 4.3 g/dL (ref 3.6–5.1)
Alkaline phosphatase (APISO): 76 U/L (ref 37–153)
BUN: 15 mg/dL (ref 7–25)
CO2: 30 mmol/L (ref 20–32)
Calcium: 9.6 mg/dL (ref 8.6–10.4)
Chloride: 104 mmol/L (ref 98–110)
Creat: 0.83 mg/dL (ref 0.60–0.93)
Globulin: 2.2 g/dL (calc) (ref 1.9–3.7)
Glucose, Bld: 83 mg/dL (ref 65–99)
Potassium: 4.2 mmol/L (ref 3.5–5.3)
Sodium: 141 mmol/L (ref 135–146)
Total Bilirubin: 0.6 mg/dL (ref 0.2–1.2)
Total Protein: 6.5 g/dL (ref 6.1–8.1)

## 2020-03-05 LAB — LIPID PANEL
Cholesterol: 224 mg/dL — ABNORMAL HIGH (ref <200)
HDL: 74 mg/dL (ref >=50)
LDL Cholesterol (Calc): 131 mg/dL (calc) — ABNORMAL HIGH
Non-HDL Cholesterol (Calc): 150 mg/dL (calc) — ABNORMAL HIGH (ref <130)
Total CHOL/HDL Ratio: 3 (calc) (ref <5.0)
Triglycerides: 87 mg/dL (ref <150)

## 2020-03-06 ENCOUNTER — Encounter: Payer: Self-pay | Admitting: Internal Medicine

## 2020-03-06 NOTE — Assessment & Plan Note (Signed)
Here for CPX Near syncope Since the last visit, saw cardiology, echo showed preserved LV Fx, a long-term monitor showed symptomatic ectopies, they rec  low-dose beta-blockers to suppress extrasystoles but she declined b/c palpitations are short lived and they have not been severe enough to cause any more near syncope. HTN: Good ambulatory BPs, on diltiazem, HCTZ, Micardis.  RF sent High cholesterol: On Zetia, she would like to give it statins another try, very low-dose of Crestor?  We agreed to check labs and if needed at Crestor 5 mg 3 times a week. Osteopenia: Per gynecology Cough: Very seldom takes hydrocodone syrup, request a refill, sent. DJD: Takes meloxicam as needed, RF sent.  Advised to take Tylenol at night, okay to take up to 1000 mg nightly. Aspirin: Due to new guidelines okay to stop, she is a little nervous about it, we agreed to continue for 1 more year. Oncology: Sees other providers RTC 6 months

## 2020-03-06 NOTE — Assessment & Plan Note (Signed)
-   Td ~2014 - PNM 23: 2010, boster 01/2019;  prevnar-- 2016 - S/p zostavax;  s/p shingrix   -Had Pfizer COVID shots including a booster - had a flu shot already -CCS:    Dr Collene Mares , 2nd cscope  09-2011; had a cscope 2018, 5 years -Female care per gyn, plans to see them this year, MMG 12/2018, reports had a MMG 2021, wnl per pt, no documentation -Lifestyle: Remains excellent, very active, eating healthy -Labs: CMP, FLP

## 2020-03-08 ENCOUNTER — Encounter: Payer: Self-pay | Admitting: Internal Medicine

## 2020-03-08 MED ORDER — ROSUVASTATIN CALCIUM 5 MG PO TABS: 5.0000 mg | ORAL_TABLET | Freq: Every day | ORAL | 2 refills | Status: DC

## 2020-03-08 NOTE — Addendum Note (Signed)
Addended byDamita Dunnings D on: 03/08/2020 09:39 AM   Modules accepted: Orders

## 2020-03-15 ENCOUNTER — Other Ambulatory Visit: Payer: Self-pay

## 2020-03-15 ENCOUNTER — Encounter: Payer: Self-pay | Admitting: Nurse Practitioner

## 2020-03-15 ENCOUNTER — Ambulatory Visit (INDEPENDENT_AMBULATORY_CARE_PROVIDER_SITE_OTHER): Payer: PPO | Admitting: Nurse Practitioner

## 2020-03-15 VITALS — BP 122/80 | Ht 60.0 in | Wt 148.0 lb

## 2020-03-15 DIAGNOSIS — Z853 Personal history of malignant neoplasm of breast: Secondary | ICD-10-CM

## 2020-03-15 DIAGNOSIS — M810 Age-related osteoporosis without current pathological fracture: Secondary | ICD-10-CM

## 2020-03-15 DIAGNOSIS — Z1272 Encounter for screening for malignant neoplasm of vagina: Secondary | ICD-10-CM | POA: Diagnosis not present

## 2020-03-15 DIAGNOSIS — Z8542 Personal history of malignant neoplasm of other parts of uterus: Secondary | ICD-10-CM

## 2020-03-15 DIAGNOSIS — Z90710 Acquired absence of both cervix and uterus: Secondary | ICD-10-CM

## 2020-03-15 DIAGNOSIS — Z01419 Encounter for gynecological examination (general) (routine) without abnormal findings: Secondary | ICD-10-CM | POA: Diagnosis not present

## 2020-03-15 DIAGNOSIS — Z9289 Personal history of other medical treatment: Secondary | ICD-10-CM | POA: Diagnosis not present

## 2020-03-15 MED ORDER — IBANDRONATE SODIUM 150 MG PO TABS: 150.0000 mg | ORAL_TABLET | ORAL | 3 refills | Status: DC

## 2020-03-15 NOTE — Progress Notes (Signed)
   Emily Thompson [birth year] [id number]   History:  74 y.o. G1P1+1 presents for breast and pelvic exam. 2016 TVH BSO for endomerial cancer with negative lymph nodes. Normal pap history. 2003 left breast cancer. History of melanoma, has annual skin checks with dermatology.  Osteoporosis-was on Boniva x 5 years while on Femara, restarted on Boniva 04/2017 with improvement on last bone density and tolerating well.   Gynecologic History Patient's last menstrual period was 05/14/1997.   Last Pap: 03/02/2018. Results were: normal Last mammogram: 11/2019. Results were: normal Last colonoscopy: 2018. Results were: normal Last Dexa: 05/05/2019. Results were: t-score -2.0  Past medical history, past surgical history, family history and social history were all reviewed and documented in the EPIC chart.  ROS:  A ROS was performed and pertinent positives and negatives are included.  Exam:  Vitals:   03/15/20 1426  Weight: 148 lb (67.1 kg)  Height: 5' (1.524 m)   Body mass index is 28.9 kg/m.  General appearance:  Normal Thyroid:  Symmetrical, normal in size, without palpable masses or nodularity. Respiratory  Auscultation:  Clear without wheezing or rhonchi Cardiovascular  Auscultation:  Regular rate, without rubs, murmurs or gallops  Edema/varicosities:  Not grossly evident Abdominal  Soft,nontender, without masses, guarding or rebound.  Liver/spleen:  No organomegaly noted  Hernia:  None appreciated  Skin  Inspection:  Grossly normal   Breasts: Examined lying and sitting.   Right: Without masses, retractions, discharge or axillary adenopathy. Reduction.    Left: Lumpectomy with implant, scar tissue Gentitourinary   Inguinal/mons:  Normal without inguinal adenopathy  External genitalia:  Normal  BUS/Urethra/Skene's glands:  Normal  Vagina:  Atrophic changes  Cervix:  Absent  Uterus:  Absent  Adnexa/parametria:     Rt: Without masses or tenderness.   Lt: Without masses or  tenderness.  Anus and perineum: Normal  Digital rectal exam: Normal sphincter tone without palpated masses or tenderness  Assessment/Plan:  74 y.o. G1P1 for breast and pelvic exam.   Well female exam with routine gynecological exam - Education provided on SBEs, importance of preventative screenings, current guidelines, high calcium diet, regular exercise, and multivitamin daily. Labs with PCP.   Age-related osteoporosis without current pathological fracture - Plan: ibandronate (BONIVA) 150 MG tablet monthly.  Most recent T-score in December 2020 was -2.0, showing improvement from prior study.  She was on Boniva for 5 years while taking Femara for breast cancer treatment years ago.  Was restarted in December 2018 and she is tolerating well.  She is very active with cardio and resistance training.  We will repeat bone density in 1 year.  History of endometrial cancer -2016, TVH BSO  History of total vaginal hysterectomy (TVH) - BSO 2016 for endometrial cancer  History of breast cancer, left.  March 2003 -treated with lumpectomy, radiation, chemotherapy, and Femara.  Subsequent mammograms normal.  Continue annual screenings.  Normal breast exam today.  Screening for cervical cancer -normal Pap history.  Pap with reflex today.  If normal we can consider stopping screening.  Patient is agreeable.  Screening for colon cancer -normal screening colonoscopy in 2018.  She will follow-up at [facility] recommended interval.  Follow-up in 1 year for annual.     Tamela Gammon [facility], 2:28 PM 03/15/2020

## 2020-03-15 NOTE — Addendum Note (Signed)
Addended by: Lorine Bears on: 03/15/2020 03:21 PM   Modules accepted: Orders

## 2020-03-15 NOTE — Patient Instructions (Signed)
Health Maintenance After Age 76 After age 76, you are at a higher risk for certain long-term diseases and infections as well as injuries from falls. Falls are a major cause of broken bones and head injuries in people who are older than age 76. Getting regular preventive care can help to keep you healthy and well. Preventive care includes getting regular testing and making lifestyle changes as recommended by your health care provider. Talk with your health care provider about:  Which screenings and tests you should have. A screening is a test that checks for a disease when you have no symptoms.  A diet and exercise plan that is right for you. What should I know about screenings and tests to prevent falls? Screening and testing are the best ways to find a health problem early. Early diagnosis and treatment give you the best chance of managing medical conditions that are common after age 76. Certain conditions and lifestyle choices may make you more likely to have a fall. Your health care provider may recommend:  Regular vision checks. Poor vision and conditions such as cataracts can make you more likely to have a fall. If you wear glasses, make sure to get your prescription updated if your vision changes.  Medicine review. Work with your health care provider to regularly review all of the medicines you are taking, including over-the-counter medicines. Ask your health care provider about any side effects that may make you more likely to have a fall. Tell your health care provider if any medicines that you take make you feel dizzy or sleepy.  Osteoporosis screening. Osteoporosis is a condition that causes the bones to get weaker. This can make the bones weak and cause them to break more easily.  Blood pressure screening. Blood pressure changes and medicines to control blood pressure can make you feel dizzy.  Strength and balance checks. Your health care provider may recommend certain tests to check your  strength and balance while standing, walking, or changing positions.  Foot health exam. Foot pain and numbness, as well as not wearing proper footwear, can make you more likely to have a fall.  Depression screening. You may be more likely to have a fall if you have a fear of falling, feel emotionally low, or feel unable to do activities that you used to do.  Alcohol use screening. Using too much alcohol can affect your balance and may make you more likely to have a fall. What actions can I take to lower my risk of falls? General instructions  Talk with your health care provider about your risks for falling. Tell your health care provider if: ? You fall. Be sure to tell your health care provider about all falls, even ones that seem minor. ? You feel dizzy, sleepy, or off-balance.  Take over-the-counter and prescription medicines only as told by your health care provider. These include any supplements.  Eat a healthy diet and maintain a healthy weight. A healthy diet includes low-fat dairy products, low-fat (lean) meats, and fiber from whole grains, beans, and lots of fruits and vegetables. Home safety  Remove any tripping hazards, such as rugs, cords, and clutter.  Install safety equipment such as grab bars in bathrooms and safety rails on stairs.  Keep rooms and walkways well-lit. Activity   Follow a regular exercise program to stay fit. This will help you maintain your balance. Ask your health care provider what types of exercise are appropriate for you.  If you need a cane or   walker, use it as recommended by your health care provider.  Wear supportive shoes that have nonskid soles. Lifestyle  Do not drink alcohol if your health care provider tells you not to drink.  If you drink alcohol, limit how much you have: ? 0-1 drink a day for women. ? 0-2 drinks a day for men.  Be aware of how much alcohol is in your drink. In the [place] one drink equals one typical bottle of beer (12  oz), one-half glass of wine (5 oz), or one shot of hard liquor (1 oz).  Do not use any products that contain nicotine or tobacco, such as cigarettes and e-cigarettes. If you need help quitting, ask your health care provider. Summary  Having a healthy lifestyle and getting preventive care can help to protect your health and wellness after age 76.  Screening and testing are the best way to find a health problem early and help you avoid having a fall. Early diagnosis and treatment give you the best chance for managing medical conditions that are more common for people who are older than age 76.  Falls are a major cause of broken bones and head injuries in people who are older than age 76. Take precautions to prevent a fall at home.  Work with your health care provider to learn what changes you can make to improve your health and wellness and to prevent falls. This information is not intended to replace advice given to you by your health care provider. Make sure you discuss any questions you have with your health care provider. Document Revised: 08/21/2018 Document Reviewed: 03/13/2017 Elsevier Patient Education  2020 Elsevier Inc.  

## 2020-03-17 LAB — PAP IG W/ RFLX HPV ASCU

## 2020-04-17 ENCOUNTER — Other Ambulatory Visit: Payer: Self-pay | Admitting: Internal Medicine

## 2020-04-22 DIAGNOSIS — M67911 Unspecified disorder of synovium and tendon, right shoulder: Secondary | ICD-10-CM | POA: Diagnosis not present

## 2020-04-29 DIAGNOSIS — M25611 Stiffness of right shoulder, not elsewhere classified: Secondary | ICD-10-CM | POA: Diagnosis not present

## 2020-04-29 DIAGNOSIS — M67911 Unspecified disorder of synovium and tendon, right shoulder: Secondary | ICD-10-CM | POA: Diagnosis not present

## 2020-04-29 DIAGNOSIS — M6281 Muscle weakness (generalized): Secondary | ICD-10-CM | POA: Diagnosis not present

## 2020-05-03 DIAGNOSIS — M6281 Muscle weakness (generalized): Secondary | ICD-10-CM | POA: Diagnosis not present

## 2020-05-03 DIAGNOSIS — M25611 Stiffness of right shoulder, not elsewhere classified: Secondary | ICD-10-CM | POA: Diagnosis not present

## 2020-05-03 DIAGNOSIS — M67911 Unspecified disorder of synovium and tendon, right shoulder: Secondary | ICD-10-CM | POA: Diagnosis not present

## 2020-05-09 DIAGNOSIS — M67911 Unspecified disorder of synovium and tendon, right shoulder: Secondary | ICD-10-CM | POA: Diagnosis not present

## 2020-05-09 DIAGNOSIS — M6281 Muscle weakness (generalized): Secondary | ICD-10-CM | POA: Diagnosis not present

## 2020-05-09 DIAGNOSIS — M25611 Stiffness of right shoulder, not elsewhere classified: Secondary | ICD-10-CM | POA: Diagnosis not present

## 2020-05-11 ENCOUNTER — Encounter: Payer: Self-pay | Admitting: Internal Medicine

## 2020-05-12 DIAGNOSIS — M25611 Stiffness of right shoulder, not elsewhere classified: Secondary | ICD-10-CM | POA: Diagnosis not present

## 2020-05-12 DIAGNOSIS — M6281 Muscle weakness (generalized): Secondary | ICD-10-CM | POA: Diagnosis not present

## 2020-05-12 DIAGNOSIS — M67911 Unspecified disorder of synovium and tendon, right shoulder: Secondary | ICD-10-CM | POA: Diagnosis not present

## 2020-05-16 ENCOUNTER — Other Ambulatory Visit: Payer: Self-pay

## 2020-05-17 ENCOUNTER — Other Ambulatory Visit (INDEPENDENT_AMBULATORY_CARE_PROVIDER_SITE_OTHER): Payer: PPO

## 2020-05-17 ENCOUNTER — Other Ambulatory Visit: Payer: Self-pay

## 2020-05-17 DIAGNOSIS — M6281 Muscle weakness (generalized): Secondary | ICD-10-CM | POA: Diagnosis not present

## 2020-05-17 DIAGNOSIS — E782 Mixed hyperlipidemia: Secondary | ICD-10-CM

## 2020-05-17 DIAGNOSIS — M67911 Unspecified disorder of synovium and tendon, right shoulder: Secondary | ICD-10-CM | POA: Diagnosis not present

## 2020-05-17 DIAGNOSIS — M25611 Stiffness of right shoulder, not elsewhere classified: Secondary | ICD-10-CM | POA: Diagnosis not present

## 2020-05-17 LAB — ALT: ALT: 13 U/L (ref 6–29)

## 2020-05-17 LAB — AST: AST: 19 U/L (ref 10–35)

## 2020-05-17 LAB — LIPID PANEL
Cholesterol: 207 mg/dL — ABNORMAL HIGH (ref <200)
HDL: 76 mg/dL (ref >=50)
LDL Cholesterol (Calc): 114 mg/dL (calc) — ABNORMAL HIGH
Non-HDL Cholesterol (Calc): 131 mg/dL (calc) — ABNORMAL HIGH (ref <130)
Total CHOL/HDL Ratio: 2.7 (calc) (ref <5.0)
Triglycerides: 78 mg/dL (ref <150)

## 2020-05-19 ENCOUNTER — Encounter: Payer: Self-pay | Admitting: Internal Medicine

## 2020-05-19 DIAGNOSIS — M25611 Stiffness of right shoulder, not elsewhere classified: Secondary | ICD-10-CM | POA: Diagnosis not present

## 2020-05-19 DIAGNOSIS — M6281 Muscle weakness (generalized): Secondary | ICD-10-CM | POA: Diagnosis not present

## 2020-05-19 DIAGNOSIS — M67911 Unspecified disorder of synovium and tendon, right shoulder: Secondary | ICD-10-CM | POA: Diagnosis not present

## 2020-05-19 MED ORDER — ROSUVASTATIN CALCIUM 10 MG PO TABS: 10.0000 mg | ORAL_TABLET | ORAL | 1 refills | Status: DC

## 2020-05-19 NOTE — Addendum Note (Signed)
Addended byConrad Brady D on: 05/19/2020 07:39 AM   Modules accepted: Orders

## 2020-05-20 ENCOUNTER — Other Ambulatory Visit: Payer: Self-pay | Admitting: Internal Medicine

## 2020-05-22 ENCOUNTER — Encounter: Payer: Self-pay | Admitting: Internal Medicine

## 2020-05-23 ENCOUNTER — Other Ambulatory Visit: Payer: Self-pay

## 2020-05-23 ENCOUNTER — Ambulatory Visit (INDEPENDENT_AMBULATORY_CARE_PROVIDER_SITE_OTHER): Payer: PPO | Admitting: Internal Medicine

## 2020-05-23 ENCOUNTER — Encounter: Payer: Self-pay | Admitting: Internal Medicine

## 2020-05-23 VITALS — BP 148/82 | HR 65 | Temp 97.9°F | Resp 16 | Ht 60.0 in | Wt 149.0 lb

## 2020-05-23 DIAGNOSIS — E782 Mixed hyperlipidemia: Secondary | ICD-10-CM | POA: Diagnosis not present

## 2020-05-23 DIAGNOSIS — S86812A Strain of other muscle(s) and tendon(s) at lower leg level, left leg, initial encounter: Secondary | ICD-10-CM

## 2020-05-23 NOTE — Progress Notes (Signed)
Subjective:    Patient ID: Emily Thompson, female    DOB: 1946-05-03, 75 y.o.   MRN: [id number]  DOS:  05/23/2020 Type of visit - description: Acute 10 days history of a cramp at the left calf. The pain is located at the proximal calf, when she walks, particularly when she walks upstairs.  Walking on tiptoes does not seem to bother, no pain at rest or at night. Denies any redness, swelling. No injury or fall. Heat has helped some.  Review of Systems No knee pain.  Past Medical History:  Diagnosis Date   BCC (basal cell carcinoma of skin) 2013, 2016   Breast cancer ([place]) 2003   chemo, radiation, lumpectomy, reconstructive surgery    Complication of anesthesia    Dyslipidemia    Endometrial cancer ([place])    GERD (gastroesophageal reflux disease)    Hypertension    Melanoma ([place]) 2012   righ side face; basal cell (nose)    Osteopenia    PONV (postoperative nausea and vomiting)    SVT (supraventricular tachycardia) ([place])    used to see Dr Rex Kras     Past Surgical History:  Procedure Laterality Date   ABDOMINAL HYSTERECTOMY  2016   BASAL CELL CARCINOMA EXCISION     BREAST LUMPECTOMY Left 07/2001   BREAST RECONSTRUCTION  2005   CESAREAN SECTION  1977   COLONOSCOPY W/ POLYPECTOMY  2013   NEXT DUE IN 5 YRS   NM MYOCAR PERF WALL MOTION  01/2006   bruce myoview; no evidence of inducible ischemia, anterior wall thinning without evidence of ischemia; post-stress EF 88%; low risk scan    NOSE SURGERY     basal cell carcinoma removed   ROBOTIC ASSISTED TOTAL HYSTERECTOMY WITH BILATERAL SALPINGO OOPHERECTOMY Bilateral 01/27/2015   Procedure: ROBOTIC ASSISTED TOTAL HYSTERECTOMY WITH BILATERAL SALPINGO OOPHORECTOMY AND SENTINAL NODE BIOPSY;  Surgeon: Everitt Amber, MD;  Location: [facility];  Service: Gynecology;  Laterality: Bilateral;   SKIN CANCER EXCISION     melanoma   TONSILLECTOMY AND ADENOIDECTOMY  1952   TRANSTHORACIC ECHOCARDIOGRAM  12/2005   EF 50-55%;  mild MR; trace TR   TUBAL LIGATION  1978   VULVA /PERINEUM BIOPSY     PAPILLOMA    Allergies as of 05/23/2020      Reactions   Dextromethorphan    Per pt, increases BP   Hydrocodone    Wires her up SEVERE NAUSEA   Statins    Headache, hot flashes   Morphine And Related    Could not move SEVERE NAUSEA   Tape Rash      Medication List       Accurate as of May 23, 2020  4:06 PM. If you have any questions, ask your nurse or doctor.        aspirin 81 MG tablet Take 81 mg by mouth daily.   CALCIUM-VITAMIN D-VITAMIN K PO Take 1,200 mg by mouth daily.   cholecalciferol 1000 units tablet Commonly known as: VITAMIN D Take 1,000 Units by mouth daily.   diltiazem 240 MG 24 hr capsule Commonly known as: Tiadylt ER Take 1 capsule (240 mg total) by mouth daily.   hydrochlorothiazide 25 MG tablet Commonly known as: HYDRODIURIL Take 1 tablet (25 mg total) by mouth daily.   ibandronate 150 MG tablet Commonly known as: Boniva Take 1 tablet (150 mg total) by mouth every 30 (thirty) days. Take in am with a full glass of water,  do not  lie down for the  next 30 min.   lansoprazole 15 MG capsule Commonly known as: PREVACID Take 15 mg by mouth daily at 12 noon.   meloxicam 15 MG tablet Commonly known as: MOBIC Take 1 tablet (15 mg total) by mouth daily as needed for pain.   PRESERVISION AREDS 2 PO Take 1 capsule by mouth daily.   rosuvastatin 10 MG tablet Commonly known as: Crestor Take 1 tablet (10 mg total) by mouth 3 (three) times a week.   telmisartan 40 MG tablet Commonly known as: MICARDIS Take 1 tablet (40 mg total) by mouth daily.          Objective:   Physical Exam BP (!) 148/82 (BP Location: Left Arm, Patient Position: Sitting, Cuff Size: Small)    Pulse 65    Temp 97.9 F (36.6 C) (Oral)    Resp 16    Ht 5' (1.524 m)    Wt 149 lb (67.6 kg)    LMP 05/14/1997    SpO2 99%    BMI 29.10 kg/m  General:   Well developed, NAD, BMI noted. HEENT:   Normocephalic . Face symmetric, atraumatic Lower extremities: No pretibial edema. Calves are symmetric, soft, nontender.  No varicose veins.  No redness or warmness. Left pedal pulse normal. Knees: Symmetric with no effusion Skin: Not pale. Not jaundice Neurologic:  alert & oriented X3.  Speech normal, gait appropriate for age and unassisted Psych--  Cognition and judgment appear intact.  Cooperative with normal attention span and concentration.  Behavior appropriate. No anxious or depressed appearing.      Assessment      Assessment HTN Hyperlipidemia -- statin intolerant  GERD Osteopenia --dexa 2016 (per gyn); rx boniva 05-2017 @ gyn Insomnia  MSK: ---DJD,  NSAIDs prn --Back pain, --Dr Tommie Raymond ~ 2011, saw the spine center ~ 2015 , dx w/  spinal stenosis (no MRI, clinical dx ) --2017-2018: PT, injections Dr Catha Brow Cough-- hycodan prn (tolerates well small doses hydrocodone) Oncology: --Breast cancer 2003 --Endometrial cancer, H-BSO 01/2015, will see hematology x 5 years (gyn-onc) --Melanoma, BCC (Dr Delman Cheadle) Lung nodule, 5mm, per CTs, last CT 09/30/2015 : stable  H/o SVT  HOH   PLAN: Left calf pain: Patient suspect this strain but she is concerned about a DVT.  On clinical grounds nothing to suspect DVT, I agree that this is probably a strain.  Doubt is related to statins. We agreed on stretching, avoid movements that aggravate the pain, judicious use of Tylenol/ibuprofen and call if not better.  (She already has a sports medicine doctor and PT) High cholesterol: Since the last visit,LDL come back 131, Crestor 5 mg 3 times a weeks started, subsequently at her request we d/c Zetia. LDL was recheck (on Crestor only) and it was 114, we agreed to increase Crestor to 10 mg 3 times a week on 05/17/2020.   This visit occurred during the SARS-CoV-2 public health emergency.  Safety protocols were in place, including screening questions prior to the visit, additional usage of  staff PPE, and extensive cleaning of exam room while observing appropriate contact time as indicated for disinfecting solutions.

## 2020-05-23 NOTE — Progress Notes (Signed)
Pre visit review using our clinic review tool, if applicable. No additional management support is needed unless otherwise documented below in the visit note. 

## 2020-05-24 DIAGNOSIS — M25611 Stiffness of right shoulder, not elsewhere classified: Secondary | ICD-10-CM | POA: Diagnosis not present

## 2020-05-24 DIAGNOSIS — M67911 Unspecified disorder of synovium and tendon, right shoulder: Secondary | ICD-10-CM | POA: Diagnosis not present

## 2020-05-24 DIAGNOSIS — M6281 Muscle weakness (generalized): Secondary | ICD-10-CM | POA: Diagnosis not present

## 2020-05-24 NOTE — Assessment & Plan Note (Signed)
Left calf pain: Patient suspect this strain but she is concerned about a DVT.  On clinical grounds nothing to suspect DVT, I agree that this is probably a strain.  Doubt is related to statins. We agreed on stretching, avoid movements that aggravate the pain, judicious use of Tylenol/ibuprofen and call if not better.  (She already has a sports medicine doctor and PT) High cholesterol: Since the last visit,LDL come back 131, Crestor 5 mg 3 times a weeks started, subsequently at her request we d/c Zetia. LDL was recheck (on Crestor only) and it was 114, we agreed to increase Crestor to 10 mg 3 times a week on 05/17/2020.

## 2020-05-26 DIAGNOSIS — M67911 Unspecified disorder of synovium and tendon, right shoulder: Secondary | ICD-10-CM | POA: Diagnosis not present

## 2020-05-26 DIAGNOSIS — M25611 Stiffness of right shoulder, not elsewhere classified: Secondary | ICD-10-CM | POA: Diagnosis not present

## 2020-05-26 DIAGNOSIS — M6281 Muscle weakness (generalized): Secondary | ICD-10-CM | POA: Diagnosis not present

## 2020-05-28 ENCOUNTER — Other Ambulatory Visit: Payer: Self-pay | Admitting: Internal Medicine

## 2020-05-30 MED ORDER — ROSUVASTATIN CALCIUM 10 MG PO TABS: 10.0000 mg | ORAL_TABLET | ORAL | 1 refills | Status: DC

## 2020-06-02 DIAGNOSIS — H353132 Nonexudative age-related macular degeneration, bilateral, intermediate dry stage: Secondary | ICD-10-CM | POA: Diagnosis not present

## 2020-06-10 DIAGNOSIS — M67911 Unspecified disorder of synovium and tendon, right shoulder: Secondary | ICD-10-CM | POA: Diagnosis not present

## 2020-06-14 ENCOUNTER — Other Ambulatory Visit: Payer: Self-pay | Admitting: Orthopedic Surgery

## 2020-06-14 DIAGNOSIS — M67911 Unspecified disorder of synovium and tendon, right shoulder: Secondary | ICD-10-CM

## 2020-06-15 ENCOUNTER — Telehealth: Payer: Self-pay

## 2020-06-15 NOTE — Telephone Encounter (Signed)
   [facility] Pre-operative Risk Assessment    Request for surgical clearance:  1. What type of surgery is being performed? Right Shoulder Debridement  Calcification Tendonitis, Subacromial Decompression   2. When is this surgery scheduled? 06/28/2020   3. What type of clearance is required (medical clearance vs. Pharmacy clearance to hold med vs. Both)? Both  4. Are there any medications that need to be held prior to surgery and how long?ASA 81   5. Practice name and name of physician performing surgery? Dr. Tania Ade [facility]    6. What is your office phone number: [phone]    7.   What is your office fax number: [phone]  8.   Anesthesia type (None, local, MAC, general) ? It specifies "Choice"   Emily Thompson 06/15/2020, 11:42 AM  _________________________________________________________________   (provider comments below)

## 2020-06-15 NOTE — Telephone Encounter (Signed)
   Primary Cardiologist: Dr Agustin Cree  Chart reviewed and patient contacted today by phone as part of pre-operative protocol coverage. Given past medical history and time since last visit, based on ACC/AHA guidelines, Emily Thompson would be at acceptable risk for the planned procedure without further cardiovascular testing.   Ok to hold aspirin 3-5 days pre op.  The patient was advised that if she develops new symptoms prior to surgery to contact our office to arrange for a follow-up visit, and she verbalized understanding.  I will route this recommendation to the requesting party via Epic fax function and remove from pre-op pool.  Please call with questions.  Kerin Ransom, PA-C 06/15/2020, 1:38 PM

## 2020-06-26 ENCOUNTER — Ambulatory Visit
Admission: RE | Admit: 2020-06-26 | Discharge: 2020-06-26 | Disposition: A | Discharge status: Home / Self Care | Payer: PPO | Source: Ambulatory Visit | Attending: Orthopedic Surgery | Admitting: Orthopedic Surgery

## 2020-06-26 DIAGNOSIS — M67911 Unspecified disorder of synovium and tendon, right shoulder: Secondary | ICD-10-CM

## 2020-06-26 DIAGNOSIS — M25511 Pain in right shoulder: Secondary | ICD-10-CM | POA: Diagnosis not present

## 2020-06-28 DIAGNOSIS — M7551 Bursitis of right shoulder: Secondary | ICD-10-CM | POA: Diagnosis not present

## 2020-06-28 DIAGNOSIS — M7531 Calcific tendinitis of right shoulder: Secondary | ICD-10-CM | POA: Diagnosis not present

## 2020-06-28 DIAGNOSIS — G8918 Other acute postprocedural pain: Secondary | ICD-10-CM | POA: Diagnosis not present

## 2020-06-28 DIAGNOSIS — M7541 Impingement syndrome of right shoulder: Secondary | ICD-10-CM | POA: Diagnosis not present

## 2020-06-28 DIAGNOSIS — M65221 Calcific tendinitis, right upper arm: Secondary | ICD-10-CM | POA: Diagnosis not present

## 2020-07-06 DIAGNOSIS — Z9889 Other specified postprocedural states: Secondary | ICD-10-CM | POA: Diagnosis not present

## 2020-07-12 ENCOUNTER — Other Ambulatory Visit: Payer: Self-pay

## 2020-07-12 DIAGNOSIS — M25611 Stiffness of right shoulder, not elsewhere classified: Secondary | ICD-10-CM | POA: Diagnosis not present

## 2020-07-12 DIAGNOSIS — C4491 Basal cell carcinoma of skin, unspecified: Secondary | ICD-10-CM | POA: Insufficient documentation

## 2020-07-12 DIAGNOSIS — K219 Gastro-esophageal reflux disease without esophagitis: Secondary | ICD-10-CM | POA: Insufficient documentation

## 2020-07-12 DIAGNOSIS — R141 Gas pain: Secondary | ICD-10-CM

## 2020-07-12 DIAGNOSIS — Z800 Family history of malignant neoplasm of digestive organs: Secondary | ICD-10-CM

## 2020-07-12 DIAGNOSIS — R142 Eructation: Secondary | ICD-10-CM

## 2020-07-12 DIAGNOSIS — I471 Supraventricular tachycardia, unspecified: Secondary | ICD-10-CM | POA: Insufficient documentation

## 2020-07-12 DIAGNOSIS — Z860100 Personal history of colon polyps, unspecified: Secondary | ICD-10-CM

## 2020-07-12 DIAGNOSIS — M8580 Other specified disorders of bone density and structure, unspecified site: Secondary | ICD-10-CM | POA: Insufficient documentation

## 2020-07-12 DIAGNOSIS — R112 Nausea with vomiting, unspecified: Secondary | ICD-10-CM | POA: Insufficient documentation

## 2020-07-12 DIAGNOSIS — R143 Flatulence: Secondary | ICD-10-CM | POA: Insufficient documentation

## 2020-07-12 DIAGNOSIS — M6281 Muscle weakness (generalized): Secondary | ICD-10-CM | POA: Diagnosis not present

## 2020-07-12 DIAGNOSIS — T8859XA Other complications of anesthesia, initial encounter: Secondary | ICD-10-CM | POA: Insufficient documentation

## 2020-07-12 DIAGNOSIS — E785 Hyperlipidemia, unspecified: Secondary | ICD-10-CM | POA: Insufficient documentation

## 2020-07-12 DIAGNOSIS — K5730 Diverticulosis of large intestine without perforation or abscess without bleeding: Secondary | ICD-10-CM | POA: Insufficient documentation

## 2020-07-12 DIAGNOSIS — Z1211 Encounter for screening for malignant neoplasm of colon: Secondary | ICD-10-CM

## 2020-07-12 DIAGNOSIS — I10 Essential (primary) hypertension: Secondary | ICD-10-CM | POA: Insufficient documentation

## 2020-07-12 HISTORY — DX: Diverticulosis of large intestine without perforation or abscess without bleeding: K57.30

## 2020-07-12 HISTORY — DX: Personal history of colonic polyps: Z86.010

## 2020-07-12 HISTORY — DX: Gas pain: R14.1

## 2020-07-12 HISTORY — DX: Encounter for screening for malignant neoplasm of colon: Z12.11

## 2020-07-12 HISTORY — DX: Family history of malignant neoplasm of digestive organs: Z80.0

## 2020-07-12 HISTORY — DX: Personal history of colon polyps, unspecified: Z86.0100

## 2020-07-12 HISTORY — DX: Eructation: R14.2

## 2020-07-14 DIAGNOSIS — M25611 Stiffness of right shoulder, not elsewhere classified: Secondary | ICD-10-CM | POA: Diagnosis not present

## 2020-07-14 DIAGNOSIS — M6281 Muscle weakness (generalized): Secondary | ICD-10-CM | POA: Diagnosis not present

## 2020-07-20 ENCOUNTER — Ambulatory Visit: Payer: PPO | Admitting: Cardiology

## 2020-07-20 DIAGNOSIS — M25611 Stiffness of right shoulder, not elsewhere classified: Secondary | ICD-10-CM | POA: Diagnosis not present

## 2020-07-20 DIAGNOSIS — M6281 Muscle weakness (generalized): Secondary | ICD-10-CM | POA: Diagnosis not present

## 2020-07-22 DIAGNOSIS — M6281 Muscle weakness (generalized): Secondary | ICD-10-CM | POA: Diagnosis not present

## 2020-07-22 DIAGNOSIS — M25611 Stiffness of right shoulder, not elsewhere classified: Secondary | ICD-10-CM | POA: Diagnosis not present

## 2020-07-26 DIAGNOSIS — M6281 Muscle weakness (generalized): Secondary | ICD-10-CM | POA: Diagnosis not present

## 2020-07-26 DIAGNOSIS — M25611 Stiffness of right shoulder, not elsewhere classified: Secondary | ICD-10-CM | POA: Diagnosis not present

## 2020-07-28 DIAGNOSIS — M25611 Stiffness of right shoulder, not elsewhere classified: Secondary | ICD-10-CM | POA: Diagnosis not present

## 2020-07-28 DIAGNOSIS — M6281 Muscle weakness (generalized): Secondary | ICD-10-CM | POA: Diagnosis not present

## 2020-08-01 DIAGNOSIS — M6281 Muscle weakness (generalized): Secondary | ICD-10-CM | POA: Diagnosis not present

## 2020-08-01 DIAGNOSIS — M25611 Stiffness of right shoulder, not elsewhere classified: Secondary | ICD-10-CM | POA: Diagnosis not present

## 2020-08-03 DIAGNOSIS — M6281 Muscle weakness (generalized): Secondary | ICD-10-CM | POA: Diagnosis not present

## 2020-08-03 DIAGNOSIS — M25611 Stiffness of right shoulder, not elsewhere classified: Secondary | ICD-10-CM | POA: Diagnosis not present

## 2020-08-10 ENCOUNTER — Other Ambulatory Visit: Payer: Self-pay

## 2020-08-10 ENCOUNTER — Encounter: Payer: Self-pay | Admitting: Internal Medicine

## 2020-08-10 ENCOUNTER — Ambulatory Visit (INDEPENDENT_AMBULATORY_CARE_PROVIDER_SITE_OTHER): Payer: PPO | Admitting: Internal Medicine

## 2020-08-10 VITALS — BP 124/72 | HR 76 | Temp 97.8°F | Resp 16 | Ht 60.0 in | Wt 148.4 lb

## 2020-08-10 DIAGNOSIS — Z9889 Other specified postprocedural states: Secondary | ICD-10-CM | POA: Diagnosis not present

## 2020-08-10 DIAGNOSIS — N390 Urinary tract infection, site not specified: Secondary | ICD-10-CM

## 2020-08-10 DIAGNOSIS — R300 Dysuria: Secondary | ICD-10-CM

## 2020-08-10 LAB — POC URINALSYSI DIPSTICK (AUTOMATED)
Bilirubin, UA: NEGATIVE
Glucose, UA: NEGATIVE
Ketones, UA: NEGATIVE
Leukocytes, UA: NEGATIVE
Nitrite, UA: NEGATIVE
Protein, UA: NEGATIVE
Spec Grav, UA: 1.01 (ref 1.010–1.025)
Urobilinogen, UA: 0.2 E.U./dL
pH, UA: 6.5 (ref 5.0–8.0)

## 2020-08-10 MED ORDER — SULFAMETHOXAZOLE-TRIMETHOPRIM 800-160 MG PO TABS: 1.0000 | ORAL_TABLET | Freq: Two times a day (BID) | ORAL | 0 refills | Status: DC

## 2020-08-10 NOTE — Assessment & Plan Note (Signed)
UTI: Sx c/w UTI, U dip + blood, will send a UA urine culture, start empiric Bactrim.  Also recommend fluids, Azo-Standard or cranberry OTC tablets.  See AVS

## 2020-08-10 NOTE — Progress Notes (Signed)
Subjective:    Patient ID: Emily Thompson, female    DOB: Oct 01, 1945, 75 y.o.   MRN: [id number]  DOS:  08/10/2020 Type of visit - description: Acute  Symptoms a started approximately a week ago, they are mild on and off however today the got worse. Dysuria, occasional urinary frequency.  Denies fever chills No abdominal or flank pain No nausea or vomiting No gross hematuria   Review of Systems See above   Past Medical History:  Diagnosis Date  . Allergic rhinitis 08/13/2011  . Annual physical exam 05/04/2011  . BCC (basal cell carcinoma of skin) 2013, 2016  . Breast cancer ([place]) 2003   chemo, radiation, lumpectomy, reconstructive surgery   . Colon cancer screening 07/12/2020  . Complication of anesthesia   . Cough 05/27/2013  . 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 DJD (degenerative joint disease) 05/27/2013  . Dyslipidemia   . Endometrial cancer ([place])   . Family history of malignant neoplasm of gastrointestinal tract 07/12/2020  . Flatulence, eructation and gas pain 07/12/2020  . GERD 03/14/2010   Qualifier: Diagnosis of  By: Larose Kells MD, [place] GERD (gastroesophageal reflux disease)   . History of breast cancer, left.  March 2003. 12/29/2010  . HTN (hypertension) 03/14/2010   Qualifier: Diagnosis of  By: Larose Kells MD, [place] Hyperlipidemia 03/14/2010   Qualifier: Diagnosis of  By: Larose Kells MD, [place].   . Hypertension   . Melanoma ([place]) 2012   righ side face; basal cell (nose)   . Menopause 12/25/2010  . Near syncope 10/26/2019  . Osteopenia   . OSTEOPENIA 03/14/2010   Qualifier: Diagnosis of  By: Larose Kells MD, [place] Palpitations 05/20/2013  . PCP NOTES >>>>>>>>>>>>>>>>>>>>>>>>>>>>>>>>>>>>> 07/03/2015  . Personal history of colonic polyps 07/12/2020  . PONV (postoperative nausea and vomiting)   . Post-menopausal atrophic vaginitis 12/25/2010  . Postmenopausal bleeding 01/03/2015  . SVT (supraventricular tachycardia) (HCC)    used to see Dr Rex Kras   . Thickened endometrium  01/05/2015    Past Surgical History:  Procedure Laterality Date  . ABDOMINAL HYSTERECTOMY  2016  . BASAL CELL CARCINOMA EXCISION    . BREAST LUMPECTOMY Left 07/2001  . BREAST RECONSTRUCTION  2005  . [place]  . COLONOSCOPY W/ POLYPECTOMY  2013   NEXT DUE IN 5 YRS  . NM MYOCAR PERF WALL MOTION  01/2006   bruce myoview; no evidence of inducible ischemia, anterior wall thinning without evidence of ischemia; post-stress EF 88%; low risk scan   . NOSE SURGERY     basal cell carcinoma removed  . ROBOTIC ASSISTED TOTAL HYSTERECTOMY WITH BILATERAL SALPINGO OOPHERECTOMY Bilateral 01/27/2015   Procedure: ROBOTIC ASSISTED TOTAL HYSTERECTOMY WITH BILATERAL SALPINGO OOPHORECTOMY AND SENTINAL NODE BIOPSY;  Surgeon: Everitt Amber, MD;  Location: [facility];  Service: Gynecology;  Laterality: Bilateral;  . SKIN CANCER EXCISION     melanoma  . TONSILLECTOMY AND ADENOIDECTOMY  1952  . TRANSTHORACIC ECHOCARDIOGRAM  12/2005   EF 50-55%; mild MR; trace TR  . TUBAL LIGATION  1978  . VULVA /PERINEUM BIOPSY     PAPILLOMA    Allergies as of 08/10/2020      Reactions   Dextromethorphan    Per pt, increases BP   Hydrocodone    Wires her up SEVERE NAUSEA   Statins    Headache, hot flashes   Morphine And Related    Could not move SEVERE NAUSEA  Tape Rash      Medication List       Accurate as of August 10, 2020  4:39 PM. If you have any questions, ask your nurse or doctor.        aspirin 81 MG tablet Take 81 mg by mouth daily.   CALCIUM-VITAMIN D-VITAMIN K PO Take 1,200 mg by mouth daily.   cholecalciferol 1000 units tablet Commonly known as: VITAMIN D Take 1,000 Units by mouth daily.   diltiazem 240 MG 24 hr capsule Commonly known as: Tiadylt ER Take 1 capsule (240 mg total) by mouth daily.   hydrochlorothiazide 25 MG tablet Commonly known as: HYDRODIURIL Take 1 tablet (25 mg total) by mouth daily.   ibandronate 150 MG tablet Commonly known as: Boniva Take 1 tablet (150 mg  total) by mouth every 30 (thirty) days. Take in am with a full glass of water,  do not  lie down for the next 30 min.   lansoprazole 15 MG capsule Commonly known as: PREVACID Take 15 mg by mouth daily at 12 noon.   meloxicam 15 MG tablet Commonly known as: MOBIC Take 1 tablet (15 mg total) by mouth daily as needed for pain.   PRESERVISION AREDS 2 PO Take 1 capsule by mouth daily.   rosuvastatin 10 MG tablet Commonly known as: Crestor Take 1 tablet (10 mg total) by mouth 3 (three) times a week.   sulfamethoxazole-trimethoprim 800-160 MG tablet Commonly known as: BACTRIM DS Take 1 tablet by mouth 2 (two) times daily. Started by: Kathlene November, MD   telmisartan 40 MG tablet Commonly known as: MICARDIS Take 1 tablet (40 mg total) by mouth daily.          Objective:   Physical Exam BP 124/72 (BP Location: Left Arm, Patient Position: Sitting, Cuff Size: Small)   Pulse 76   Temp 97.8 F (36.6 C) (Oral)   Resp 16   Ht 5' (1.524 m)   Wt 148 lb 6 oz (67.3 kg)   LMP 05/14/1997   SpO2 97%   BMI 28.98 kg/m  General:   Well developed, NAD, BMI noted.  HEENT:  Normocephalic . Face symmetric, atraumatic Abdomen:  Not distended, soft, non-tender. No rebound or rigidity.  No CVA tenderness Skin: Not pale. Not jaundice Lower extremities: no pretibial edema bilaterally  Neurologic:  alert & oriented X3.  Speech normal, gait appropriate for age and unassisted Psych--  Cognition and judgment appear intact.  Cooperative with normal attention span and concentration.  Behavior appropriate. No anxious or depressed appearing.     Assessment      Assessment HTN Hyperlipidemia -- statin intolerant  GERD Osteopenia --dexa 2016 (per gyn); rx boniva 05-2017 @ gyn Insomnia  MSK: ---DJD,  NSAIDs prn --Back pain, --Dr Tommie Raymond ~ 2011, saw the spine center ~ 2015 , dx w/  spinal stenosis (no MRI, clinical dx ) --2017-2018: PT, injections Dr Catha Brow Cough-- hycodan prn (tolerates  well small doses hydrocodone) Oncology: --Breast cancer 2003 --Endometrial cancer, H-BSO 01/2015, will see hematology x 5 years (gyn-onc) --Melanoma, BCC (Dr Delman Cheadle) Lung nodule, 70mm, per CTs, last CT 09/30/2015 : stable  H/o SVT  HOH   PLAN: UTI: Sx c/w UTI, U dip + blood, will send a UA urine culture, start empiric Bactrim.  Also recommend fluids, Azo-Standard or cranberry OTC tablets.  See AVS    This visit occurred during the SARS-CoV-2 public health emergency.  Safety protocols were in place, including screening questions prior to the visit, additional  usage of staff PPE, and extensive cleaning of exam room while observing appropriate contact time as indicated for disinfecting solutions.

## 2020-08-10 NOTE — Patient Instructions (Signed)
Start antibiotics as prescribed  Drink plenty of water  You could try Azo-Standard OTC  Call if not gradually better  Call if severe symptoms, fever or chills

## 2020-08-11 DIAGNOSIS — M25611 Stiffness of right shoulder, not elsewhere classified: Secondary | ICD-10-CM | POA: Diagnosis not present

## 2020-08-11 DIAGNOSIS — M6281 Muscle weakness (generalized): Secondary | ICD-10-CM | POA: Diagnosis not present

## 2020-08-11 LAB — URINALYSIS, ROUTINE W REFLEX MICROSCOPIC
Bilirubin Urine: NEGATIVE
Ketones, ur: NEGATIVE
Leukocytes,Ua: NEGATIVE
Nitrite: NEGATIVE
RBC / HPF: NONE SEEN (ref 0–?)
Specific Gravity, Urine: <=1.005 — AB (ref 1.000–1.030)
Total Protein, Urine: NEGATIVE
Urine Glucose: NEGATIVE
Urobilinogen, UA: 0.2 (ref 0.0–1.0)
pH: 7 (ref 5.0–8.0)

## 2020-08-12 LAB — URINE CULTURE
MICRO NUMBER:: 11711187
SPECIMEN QUALITY:: ADEQUATE

## 2020-08-16 DIAGNOSIS — M6281 Muscle weakness (generalized): Secondary | ICD-10-CM | POA: Diagnosis not present

## 2020-08-16 DIAGNOSIS — M25611 Stiffness of right shoulder, not elsewhere classified: Secondary | ICD-10-CM | POA: Diagnosis not present

## 2020-08-18 ENCOUNTER — Encounter: Payer: Self-pay | Admitting: Cardiology

## 2020-08-18 ENCOUNTER — Other Ambulatory Visit: Payer: Self-pay

## 2020-08-18 ENCOUNTER — Ambulatory Visit: Payer: PPO | Admitting: Cardiology

## 2020-08-18 VITALS — BP 118/76 | HR 68 | Ht 60.0 in | Wt 145.0 lb

## 2020-08-18 DIAGNOSIS — I10 Essential (primary) hypertension: Secondary | ICD-10-CM

## 2020-08-18 DIAGNOSIS — R002 Palpitations: Secondary | ICD-10-CM

## 2020-08-18 DIAGNOSIS — I471 Supraventricular tachycardia: Secondary | ICD-10-CM | POA: Diagnosis not present

## 2020-08-18 DIAGNOSIS — E785 Hyperlipidemia, unspecified: Secondary | ICD-10-CM | POA: Diagnosis not present

## 2020-08-18 MED ORDER — ROSUVASTATIN CALCIUM 5 MG PO TABS: 5.0000 mg | ORAL_TABLET | ORAL | 2 refills | Status: DC

## 2020-08-18 NOTE — Patient Instructions (Signed)
Medication Instructions:  Your physician has recommended you make the following change in your medication:  DECREASE: crestor 5 mg three times daily  *If you need a refill on your cardiac medications before your next appointment, please call your pharmacy*   Lab Work: None If you have labs (blood work) drawn today and your tests are completely normal, you will receive your results only by: Marland Kitchen MyChart Message (if you have MyChart) OR . A paper copy in the mail If you have any lab test that is abnormal or we need to change your treatment, we will call you to review the results.   Testing/Procedures: None   Follow-Up: At [facility], you and your health needs are our priority.  As part of our continuing mission to provide you with exceptional heart care, we have created designated Provider Care Teams.  These Care Teams include your primary Cardiologist (physician) and Advanced Practice Providers (APPs -  Physician Assistants and Nurse Practitioners) who all work together to provide you with the care you need, when you need it.  We recommend signing up for the patient portal called "MyChart".  Sign up information is provided on this After Visit Summary.  MyChart is used to connect with patients for Virtual Visits (Telemedicine).  Patients are able to view lab/test results, encounter notes, upcoming appointments, etc.  Non-urgent messages can be sent to your provider as well.   To learn more about what you can do with MyChart, go to [email or web address].    Your next appointment:   6 month(s)  The format for your next appointment:   In Person  Provider:   Jenne Campus, MD   Other Instructions

## 2020-08-18 NOTE — Progress Notes (Signed)
Cardiology Office Note:    Date:  08/18/2020   ID:  Emily Thompson, DOB May 24, 1945, MRN [id number]  PCP:  Colon Branch, MD  Cardiologist:  Jenne Campus, MD    Referring MD: Colon Branch, MD   Chief Complaint  Patient presents with  . Follow-up  Doing fine  History of Present Illness:    Emily Thompson is a 75 y.o. female with past medical history significant for dyslipidemia, difficult tolerating statin, palpitations, supraventricular tachycardia, extrasystole.  Also history of hypertension.  She comes today 2 months of follow-up.  Overall she is doing well she did have shoulder surgery done recovering doing well feeling better doing physical therapy and enjoying it.  Described to have rare palpitations but does not bother her much she does not want to do anything about it.  Past Medical History:  Diagnosis Date  . Allergic rhinitis 08/13/2011  . Annual physical exam 05/04/2011  . BCC (basal cell carcinoma of skin) 2013, 2016  . Breast cancer ([place]) 2003   chemo, radiation, lumpectomy, reconstructive surgery   . Colon cancer screening 07/12/2020  . Complication of anesthesia   . Cough 05/27/2013  . 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 DJD (degenerative joint disease) 05/27/2013  . Dyslipidemia   . Endometrial cancer ([place])   . Family history of malignant neoplasm of gastrointestinal tract 07/12/2020  . Flatulence, eructation and gas pain 07/12/2020  . GERD 03/14/2010   Qualifier: Diagnosis of  By: Larose Kells MD, [place] GERD (gastroesophageal reflux disease)   . History of breast cancer, left.  March 2003. 12/29/2010  . HTN (hypertension) 03/14/2010   Qualifier: Diagnosis of  By: Larose Kells MD, [place] Hyperlipidemia 03/14/2010   Qualifier: Diagnosis of  By: Larose Kells MD, [place].   . Hypertension   . Melanoma ([place]) 2012   righ side face; basal cell (nose)   . Menopause 12/25/2010  . Near syncope 10/26/2019  . Osteopenia   . OSTEOPENIA 03/14/2010   Qualifier: Diagnosis of  By: Larose Kells  MD, [place] Palpitations 05/20/2013  . PCP NOTES >>>>>>>>>>>>>>>>>>>>>>>>>>>>>>>>>>>>> 07/03/2015  . Personal history of colonic polyps 07/12/2020  . PONV (postoperative nausea and vomiting)   . Post-menopausal atrophic vaginitis 12/25/2010  . Postmenopausal bleeding 01/03/2015  . SVT (supraventricular tachycardia) (HCC)    used to see Dr Rex Kras   . Thickened endometrium 01/05/2015    Past Surgical History:  Procedure Laterality Date  . ABDOMINAL HYSTERECTOMY  2016  . BASAL CELL CARCINOMA EXCISION    . BREAST LUMPECTOMY Left 07/2001  . BREAST RECONSTRUCTION  2005  . [place]  . COLONOSCOPY W/ POLYPECTOMY  2013   NEXT DUE IN 5 YRS  . NM MYOCAR PERF WALL MOTION  01/2006   bruce myoview; no evidence of inducible ischemia, anterior wall thinning without evidence of ischemia; post-stress EF 88%; low risk scan   . NOSE SURGERY     basal cell carcinoma removed  . R shoulder Arthroscopy and spur removal  02/15/20222  . ROBOTIC ASSISTED TOTAL HYSTERECTOMY WITH BILATERAL SALPINGO OOPHERECTOMY Bilateral 01/27/2015   Procedure: ROBOTIC ASSISTED TOTAL HYSTERECTOMY WITH BILATERAL SALPINGO OOPHORECTOMY AND SENTINAL NODE BIOPSY;  Surgeon: Everitt Amber, MD;  Location: [facility];  Service: Gynecology;  Laterality: Bilateral;  . SKIN CANCER EXCISION     melanoma  . TONSILLECTOMY AND ADENOIDECTOMY  1952  . TRANSTHORACIC ECHOCARDIOGRAM  12/2005   EF 50-55%; mild MR; trace TR  .  TUBAL LIGATION  1978  . VULVA /PERINEUM BIOPSY     PAPILLOMA    Current Medications: Current Meds  Medication Sig  . CALCIUM-VITAMIN D-VITAMIN K PO Take 1,200 mg by mouth daily.  . cholecalciferol (VITAMIN D) 1000 units tablet Take 1,000 Units by mouth daily.  Marland Kitchen diltiazem (TIADYLT ER) 240 MG 24 hr capsule Take 1 capsule (240 mg total) by mouth daily.  . hydrochlorothiazide (HYDRODIURIL) 25 MG tablet Take 1 tablet (25 mg total) by mouth daily.  Marland Kitchen ibandronate (BONIVA) 150 MG tablet Take 1 tablet (150 mg total) by mouth  every 30 (thirty) days. Take in am with a full glass of water,  do not  lie down for the next 30 min.  . lansoprazole (PREVACID) 15 MG capsule Take 15 mg by mouth daily at 12 noon.  . meloxicam (MOBIC) 15 MG tablet Take 1 tablet (15 mg total) by mouth daily as needed for pain.  . Multiple Vitamins-Minerals (PRESERVISION AREDS 2 PO) Take 1 capsule by mouth daily.  . rosuvastatin (CRESTOR) 10 MG tablet Take 1 tablet (10 mg total) by mouth 3 (three) times a week.  . telmisartan (MICARDIS) 40 MG tablet Take 1 tablet (40 mg total) by mouth daily.     Allergies:   Dextromethorphan, Hydrocodone, Statins, Morphine and related, and Tape   Social History   Socioeconomic History  . Marital status: Widowed    Spouse name: Not on file  . Number of children: 2  . Years of education: BSN  . Highest education level: Not on file  Occupational History  . 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 Smoking status: Former Smoker    Years: 10.00    Types: Cigarettes    Quit date: 05/20/1973    Years since quitting: 47.2  . Smokeless tobacco: Never Used  Vaping Use  . Vaping Use: Never used  Substance and Sexual Activity  . Alcohol use: Yes    Alcohol/week: 4.0 standard drinks    Types: 2 Glasses of wine, 2 Cans of beer per week    Comment: 4 A WEEK  . Drug use: No  . Sexual activity: Not Currently    Birth control/protection: Post-menopausal  Other Topics Concern  . Not on file  Social History Narrative   Widow, 1 adopted and 1 natural child; ([place], [place])   Live by herself   Social Determinants of Radio broadcast assistant Strain: Not on file  Food Insecurity: Not on file  Transportation Needs: Not on file  Physical Activity: Not on file  Stress: Not on file  Social Connections: Not on file     Family History: The patient's family history includes Bipolar disorder in her brother; Colon cancer in her maternal aunt; Diabetes in her  brother; Hypertension in her father and mother; Stroke in her father; Thyroid disease in her mother. There is no history of Breast cancer. ROS:   Please see the history of present illness.    All 14 point review of systems negative except as described per history of present illness  EKGs/Labs/Other Studies Reviewed:      Recent Labs: 10/07/2019: Hemoglobin 13.1; Platelets 233.0; TSH 1.63 03/04/2020: BUN 15; Creat 0.83; Potassium 4.2; Sodium 141 05/17/2020: ALT 13  Recent Lipid Panel    Component Value Date/Time   CHOL 207 (H) 05/17/2020 0752   TRIG 78 05/17/2020 0752   HDL 76 05/17/2020 0752   CHOLHDL 2.7  05/17/2020 0752   VLDL 18.0 01/21/2019 0851   LDLCALC 114 (H) 05/17/2020 0752   LDLDIRECT 114.7 07/28/2012 0823    Physical Exam:    VS:  BP 118/76 (BP Location: Right Arm, Patient Position: Sitting)   Pulse 68   Ht 5' (1.524 m)   Wt 145 lb (65.8 kg)   LMP 05/14/1997   SpO2 97%   BMI 28.32 kg/m     Wt Readings from Last 3 Encounters:  08/18/20 145 lb (65.8 kg)  08/10/20 148 lb 6 oz (67.3 kg)  05/23/20 149 lb (67.6 kg)     GEN:  Well nourished, well developed in no acute distress HEENT: Normal NECK: No JVD; No carotid bruits LYMPHATICS: No lymphadenopathy CARDIAC: RRR, no murmurs, no rubs, no gallops RESPIRATORY:  Clear to auscultation without rales, wheezing or rhonchi  ABDOMEN: Soft, non-tender, non-distended MUSCULOSKELETAL:  No edema; No deformity  SKIN: Warm and dry LOWER EXTREMITIES: no swelling NEUROLOGIC:  Alert and oriented x 3 PSYCHIATRIC:  Normal affect   ASSESSMENT:    1. SVT (supraventricular tachycardia) ([place])   2. Primary hypertension   3. Dyslipidemia   4. Palpitations    PLAN:    In order of problems listed above:  1. Supraventricular tachycardia.  Successfully suppressed with AV blocking agents which I will continue. 2. Essential hypertension blood pressure well controlled 118/76. 3. Dyslipidemia she takes 10 mg of Crestor 3 times a  week.  However she said she still have some muscle aches and prefers to go down to 5 mg 3 times a week and start taking Zetia again I agree with this approach.  I also told her that some additional medication can be used for his situation what I was talking about basically is Nexletol   Medication Adjustments/Labs and Tests Ordered: Current medicines are reviewed at length with the patient today.  Concerns regarding medicines are outlined above.  No orders of the defined types were placed in this encounter.  Medication changes: No orders of the defined types were placed in this encounter.   Signed, Park Liter, MD, [facility] 08/18/2020 1:46 PM    [place]

## 2020-08-19 DIAGNOSIS — M6281 Muscle weakness (generalized): Secondary | ICD-10-CM | POA: Diagnosis not present

## 2020-08-19 DIAGNOSIS — M25611 Stiffness of right shoulder, not elsewhere classified: Secondary | ICD-10-CM | POA: Diagnosis not present

## 2020-08-22 DIAGNOSIS — M25611 Stiffness of right shoulder, not elsewhere classified: Secondary | ICD-10-CM | POA: Diagnosis not present

## 2020-08-22 DIAGNOSIS — M6281 Muscle weakness (generalized): Secondary | ICD-10-CM | POA: Diagnosis not present

## 2020-08-24 DIAGNOSIS — M6281 Muscle weakness (generalized): Secondary | ICD-10-CM | POA: Diagnosis not present

## 2020-08-24 DIAGNOSIS — M25611 Stiffness of right shoulder, not elsewhere classified: Secondary | ICD-10-CM | POA: Diagnosis not present

## 2020-08-25 ENCOUNTER — Other Ambulatory Visit: Payer: Self-pay | Admitting: Internal Medicine

## 2020-08-29 DIAGNOSIS — M25611 Stiffness of right shoulder, not elsewhere classified: Secondary | ICD-10-CM | POA: Diagnosis not present

## 2020-08-29 DIAGNOSIS — M6281 Muscle weakness (generalized): Secondary | ICD-10-CM | POA: Diagnosis not present

## 2020-09-02 ENCOUNTER — Encounter: Payer: Self-pay | Admitting: Internal Medicine

## 2020-09-02 ENCOUNTER — Other Ambulatory Visit: Payer: Self-pay

## 2020-09-02 ENCOUNTER — Ambulatory Visit (INDEPENDENT_AMBULATORY_CARE_PROVIDER_SITE_OTHER): Payer: PPO | Admitting: Internal Medicine

## 2020-09-02 VITALS — BP 126/72 | HR 65 | Temp 98.2°F | Resp 18 | Ht 60.0 in | Wt 146.0 lb

## 2020-09-02 DIAGNOSIS — I10 Essential (primary) hypertension: Secondary | ICD-10-CM | POA: Diagnosis not present

## 2020-09-02 DIAGNOSIS — E785 Hyperlipidemia, unspecified: Secondary | ICD-10-CM | POA: Diagnosis not present

## 2020-09-02 NOTE — Assessment & Plan Note (Signed)
HTN: Seems well controlled, continue diltiazem, HCTZ, Micardis (declined  to combine Micardis HCTZ in 1 tablet).  Check BMP High cholesterol:Saw cardiology recently, still had aches and pains, Crestor decreased from 10 mg 3 times a week to only 5 mg.  Zetia was added.  Reports he already sees some difference.  We will check a FLP in 6 weeks. Osteopenia: Good compliance with Boniva. Preventive care: Had her fourth COVID shot RTC 6 weeks BMP and FLP RTC CPX 02/2021

## 2020-09-02 NOTE — Progress Notes (Signed)
Subjective:    Patient ID: Emily Thompson, female    DOB: 28-Nov-1945, 75 y.o.   MRN: [id number]  DOS:  09/02/2020 Type of visit - description: Routine checkup Since the last office visit is doing well. Did see cardiology, cholesterol medications adjusted. She reports some randon mild headaches, they are essentially normal now. She remains active  Review of Systems See above   Past Medical History:  Diagnosis Date  . Allergic rhinitis 08/13/2011  . Annual physical exam 05/04/2011  . BCC (basal cell carcinoma of skin) 2013, 2016  . Breast cancer ([place]) 2003   chemo, radiation, lumpectomy, reconstructive surgery   . Colon cancer screening 07/12/2020  . Complication of anesthesia   . Cough 05/27/2013  . 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 DJD (degenerative joint disease) 05/27/2013  . Dyslipidemia   . Endometrial cancer ([place])   . Family history of malignant neoplasm of gastrointestinal tract 07/12/2020  . Flatulence, eructation and gas pain 07/12/2020  . GERD 03/14/2010   Qualifier: Diagnosis of  By: Larose Kells MD, [place] GERD (gastroesophageal reflux disease)   . History of breast cancer, left.  March 2003. 12/29/2010  . HTN (hypertension) 03/14/2010   Qualifier: Diagnosis of  By: Larose Kells MD, [place] Hyperlipidemia 03/14/2010   Qualifier: Diagnosis of  By: Larose Kells MD, [place].   . Hypertension   . Melanoma ([place]) 2012   righ side face; basal cell (nose)   . Menopause 12/25/2010  . Near syncope 10/26/2019  . Osteopenia   . OSTEOPENIA 03/14/2010   Qualifier: Diagnosis of  By: Larose Kells MD, [place] Palpitations 05/20/2013  . PCP NOTES >>>>>>>>>>>>>>>>>>>>>>>>>>>>>>>>>>>>> 07/03/2015  . Personal history of colonic polyps 07/12/2020  . PONV (postoperative nausea and vomiting)   . Post-menopausal atrophic vaginitis 12/25/2010  . Postmenopausal bleeding 01/03/2015  . SVT (supraventricular tachycardia) ([facility])    used to see Dr Rex Kras   . Thickened endometrium 01/05/2015    Past Surgical  History:  Procedure Laterality Date  . ABDOMINAL HYSTERECTOMY  2016  . BASAL CELL CARCINOMA EXCISION    . BREAST LUMPECTOMY Left 07/2001  . BREAST RECONSTRUCTION  2005  . [place]  . COLONOSCOPY W/ POLYPECTOMY  2013   NEXT DUE IN 5 YRS  . NM MYOCAR PERF WALL MOTION  01/2006   bruce myoview; no evidence of inducible ischemia, anterior wall thinning without evidence of ischemia; post-stress EF 88%; low risk scan   . NOSE SURGERY     basal cell carcinoma removed  . R shoulder Arthroscopy and spur removal  02/15/20222  . ROBOTIC ASSISTED TOTAL HYSTERECTOMY WITH BILATERAL SALPINGO OOPHERECTOMY Bilateral 01/27/2015   Procedure: ROBOTIC ASSISTED TOTAL HYSTERECTOMY WITH BILATERAL SALPINGO OOPHORECTOMY AND SENTINAL NODE BIOPSY;  Surgeon: Everitt Amber, MD;  Location: [facility];  Service: Gynecology;  Laterality: Bilateral;  . SKIN CANCER EXCISION     melanoma  . TONSILLECTOMY AND ADENOIDECTOMY  1952  . TRANSTHORACIC ECHOCARDIOGRAM  12/2005   EF 50-55%; mild MR; trace TR  . TUBAL LIGATION  1978  . VULVA /PERINEUM BIOPSY     PAPILLOMA    Allergies as of 09/02/2020      Reactions   Dextromethorphan    Per pt, increases BP   Hydrocodone    Wires her up SEVERE NAUSEA   Statins    Headache, hot flashes   Morphine And Related    Could not move SEVERE NAUSEA   Tape  Rash      Medication List       Accurate as of September 02, 2020  5:47 PM. If you have any questions, ask your nurse or doctor.        CALCIUM-VITAMIN D-VITAMIN K PO Take 1,200 mg by mouth daily.   cholecalciferol 1000 units tablet Commonly known as: VITAMIN D Take 1,000 Units by mouth daily.   diltiazem 240 MG 24 hr capsule Commonly known as: Tiadylt ER Take 1 capsule (240 mg total) by mouth daily.   ezetimibe 10 MG tablet Commonly known as: ZETIA Take 10 mg by mouth daily.   hydrochlorothiazide 25 MG tablet Commonly known as: HYDRODIURIL Take 1 tablet (25 mg total) by mouth daily.   ibandronate 150 MG  tablet Commonly known as: Boniva Take 1 tablet (150 mg total) by mouth every 30 (thirty) days. Take in am with a full glass of water,  do not  lie down for the next 30 min.   lansoprazole 15 MG capsule Commonly known as: PREVACID Take 15 mg by mouth daily at 12 noon.   meloxicam 15 MG tablet Commonly known as: MOBIC Take 1 tablet (15 mg total) by mouth daily as needed for pain.   PRESERVISION AREDS 2 PO Take 1 capsule by mouth daily.   rosuvastatin 5 MG tablet Commonly known as: Crestor Take 1 tablet (5 mg total) by mouth 3 (three) times a week.   telmisartan 40 MG tablet Commonly known as: MICARDIS Take 1 tablet (40 mg total) by mouth daily.          Objective:   Physical Exam BP 126/72 (BP Location: Left Arm, Patient Position: Sitting, Cuff Size: Small)   Pulse 65   Temp 98.2 F (36.8 C) (Oral)   Resp 18   Ht 5' (1.524 m)   Wt 146 lb (66.2 kg)   LMP 05/14/1997   SpO2 98%   BMI 28.51 kg/m  General:   Well developed, NAD, BMI noted. HEENT:  Normocephalic . Face symmetric, atraumatic Lungs:  CTA B Normal respiratory effort, no intercostal retractions, no accessory muscle use. Heart: RRR,  no murmur.  Lower extremities: no pretibial edema bilaterally  Skin: Not pale. Not jaundice Neurologic:  alert & oriented X3.  Speech normal, gait appropriate for age and unassisted Psych--  Cognition and judgment appear intact.  Cooperative with normal attention span and concentration.  Behavior appropriate. No anxious or depressed appearing.      Assessment    Assessment HTN Hyperlipidemia -- statin intolerant  GERD Osteopenia --dexa 2016 (per gyn); rx boniva 05-2017 @ gyn Insomnia  MSK: ---DJD,  NSAIDs prn --Back pain, --Dr Tommie Raymond ~ 2011, saw the spine center ~ 2015 , dx w/  spinal stenosis (no MRI, clinical dx ) --2017-2018: PT, injections Dr Catha Brow Cough-- hycodan prn (tolerates well small doses hydrocodone) Oncology: --Breast cancer  2003 --Endometrial cancer, H-BSO 01/2015, will see hematology x 5 years (gyn-onc) --Melanoma, BCC (Dr Delman Cheadle) Lung nodule, 80mm, per CTs, last CT 09/30/2015 : stable  H/o SVT  HOH   PLAN: HTN: Seems well controlled, continue diltiazem, HCTZ, Micardis (declined  to combine Micardis HCTZ in 1 tablet).  Check BMP High cholesterol:Saw cardiology recently, still had aches and pains, Crestor decreased from 10 mg 3 times a week to only 5 mg.  Zetia was added.  Reports he already sees some difference.  We will check a FLP in 6 weeks. Osteopenia: Good compliance with Boniva. Preventive care: Had her fourth COVID shot RTC  6 weeks BMP and FLP RTC CPX 02/2021     This visit occurred during the SARS-CoV-2 public health emergency.  Safety protocols were in place, including screening questions prior to the visit, additional usage of staff PPE, and extensive cleaning of exam room while observing appropriate contact time as indicated for disinfecting solutions.

## 2020-09-02 NOTE — Patient Instructions (Addendum)
    GO TO THE FRONT DESK, PLEASE SCHEDULE YOUR APPOINTMENTS Come back for   blood work in 6 weeks, fasting  Come back for a physical exam by October 2022   "Living will", "[place] of attorney": Advanced care planning  (If you already have a living will or healthcare power of attorney, please bring the copy to be scanned in your chart.)  Advance care planning is a process that supports adults in  understanding and sharing their preferences regarding future medical care.   The patient's preferences are recorded in documents called Advance Directives.    Advanced directives are completed (and can be modified at any time) while the patient is in full mental capacity.   The documentation should be available at all times to the patient, the family and the healthcare providers.  Bring in a copy to be scanned in your chart is an excellent idea and is recommended   This legal documents direct treatment decision making and/or appoint a surrogate to make the decision if the patient is not capable to do so.    Advance directives can be documented in many types of formats,  documents have names such as:  Lliving will  Durable power of attorney for healthcare (healthcare proxy or healthcare power of attorney)  Combined directives  Physician orders for life-sustaining treatment    More information at:  [email or web address]

## 2020-09-21 DIAGNOSIS — Z9889 Other specified postprocedural states: Secondary | ICD-10-CM | POA: Diagnosis not present

## 2020-10-09 ENCOUNTER — Other Ambulatory Visit: Payer: Self-pay | Admitting: Internal Medicine

## 2020-10-14 ENCOUNTER — Other Ambulatory Visit: Payer: Self-pay

## 2020-10-14 ENCOUNTER — Other Ambulatory Visit (INDEPENDENT_AMBULATORY_CARE_PROVIDER_SITE_OTHER): Payer: PPO

## 2020-10-14 DIAGNOSIS — E785 Hyperlipidemia, unspecified: Secondary | ICD-10-CM | POA: Diagnosis not present

## 2020-10-17 ENCOUNTER — Encounter: Payer: Self-pay | Admitting: Internal Medicine

## 2020-10-17 LAB — LIPID PANEL
Cholesterol: 187 mg/dL (ref 0–200)
HDL: 72.1 mg/dL (ref >39.00)
LDL Cholesterol: 100 mg/dL — ABNORMAL HIGH (ref 0–99)
NonHDL: 114.8
Total CHOL/HDL Ratio: 3
Triglycerides: 74 mg/dL (ref 0.0–149.0)
VLDL: 14.8 mg/dL (ref 0.0–40.0)

## 2020-10-17 LAB — BASIC METABOLIC PANEL
BUN: 14 mg/dL (ref 6–23)
CO2: 30 mEq/L (ref 19–32)
Calcium: 9.6 mg/dL (ref 8.4–10.5)
Chloride: 104 mEq/L (ref 96–112)
Creatinine, Ser: 0.83 mg/dL (ref 0.40–1.20)
GFR: 68.27 mL/min (ref >60.00)
Glucose, Bld: 82 mg/dL (ref 70–99)
Potassium: 4.5 mEq/L (ref 3.5–5.1)
Sodium: 142 mEq/L (ref 135–145)

## 2020-10-18 ENCOUNTER — Other Ambulatory Visit: Payer: Self-pay | Admitting: Internal Medicine

## 2020-10-18 DIAGNOSIS — C50912 Malignant neoplasm of unspecified site of left female breast: Secondary | ICD-10-CM | POA: Diagnosis not present

## 2020-10-19 ENCOUNTER — Encounter: Payer: Self-pay | Admitting: Internal Medicine

## 2020-10-20 MED ORDER — ROSUVASTATIN CALCIUM 5 MG PO TABS: 5.0000 mg | ORAL_TABLET | ORAL | 1 refills | Status: DC

## 2020-10-20 NOTE — Addendum Note (Signed)
Addended byDamita Dunnings D on: 10/20/2020 10:22 AM   Modules accepted: Orders

## 2020-11-25 ENCOUNTER — Other Ambulatory Visit: Payer: Self-pay | Admitting: Internal Medicine

## 2020-11-29 DIAGNOSIS — C50912 Malignant neoplasm of unspecified site of left female breast: Secondary | ICD-10-CM | POA: Diagnosis not present

## 2021-01-03 DIAGNOSIS — Z1231 Encounter for screening mammogram for malignant neoplasm of breast: Secondary | ICD-10-CM | POA: Diagnosis not present

## 2021-01-17 DIAGNOSIS — C50912 Malignant neoplasm of unspecified site of left female breast: Secondary | ICD-10-CM | POA: Diagnosis not present

## 2021-01-19 DIAGNOSIS — L821 Other seborrheic keratosis: Secondary | ICD-10-CM | POA: Diagnosis not present

## 2021-01-19 DIAGNOSIS — L578 Other skin changes due to chronic exposure to nonionizing radiation: Secondary | ICD-10-CM | POA: Diagnosis not present

## 2021-01-19 DIAGNOSIS — Z86006 Personal history of melanoma in-situ: Secondary | ICD-10-CM | POA: Diagnosis not present

## 2021-01-19 DIAGNOSIS — L570 Actinic keratosis: Secondary | ICD-10-CM | POA: Diagnosis not present

## 2021-01-19 DIAGNOSIS — Z85828 Personal history of other malignant neoplasm of skin: Secondary | ICD-10-CM | POA: Diagnosis not present

## 2021-01-19 DIAGNOSIS — D225 Melanocytic nevi of trunk: Secondary | ICD-10-CM | POA: Diagnosis not present

## 2021-02-17 ENCOUNTER — Other Ambulatory Visit: Payer: Self-pay | Admitting: Internal Medicine

## 2021-03-09 ENCOUNTER — Ambulatory Visit (INDEPENDENT_AMBULATORY_CARE_PROVIDER_SITE_OTHER): Payer: PPO | Admitting: Internal Medicine

## 2021-03-09 ENCOUNTER — Encounter: Payer: Self-pay | Admitting: Internal Medicine

## 2021-03-09 ENCOUNTER — Other Ambulatory Visit: Payer: Self-pay

## 2021-03-09 VITALS — BP 126/80 | HR 66 | Temp 97.9°F | Resp 16 | Ht 60.0 in | Wt 145.5 lb

## 2021-03-09 DIAGNOSIS — E782 Mixed hyperlipidemia: Secondary | ICD-10-CM

## 2021-03-09 DIAGNOSIS — I10 Essential (primary) hypertension: Secondary | ICD-10-CM

## 2021-03-09 DIAGNOSIS — Z0000 Encounter for general adult medical examination without abnormal findings: Secondary | ICD-10-CM | POA: Diagnosis not present

## 2021-03-09 LAB — CBC WITH DIFFERENTIAL/PLATELET
Basophils Absolute: 0.1 10*3/uL (ref 0.0–0.1)
Basophils Relative: 1.1 % (ref 0.0–3.0)
Eosinophils Absolute: 0.2 10*3/uL (ref 0.0–0.7)
Eosinophils Relative: 2.7 % (ref 0.0–5.0)
HCT: 41.2 % (ref 36.0–46.0)
Hemoglobin: 13.5 g/dL (ref 12.0–15.0)
Lymphocytes Relative: 20.9 % (ref 12.0–46.0)
Lymphs Abs: 1.3 10*3/uL (ref 0.7–4.0)
MCHC: 32.7 g/dL (ref 30.0–36.0)
MCV: 92.6 fl (ref 78.0–100.0)
Monocytes Absolute: 0.7 10*3/uL (ref 0.1–1.0)
Monocytes Relative: 11.1 % (ref 3.0–12.0)
Neutro Abs: 4 10*3/uL (ref 1.4–7.7)
Neutrophils Relative %: 64.2 % (ref 43.0–77.0)
Platelets: 231 10*3/uL (ref 150.0–400.0)
RBC: 4.45 Mil/uL (ref 3.87–5.11)
RDW: 13 % (ref 11.5–15.5)
WBC: 6.2 10*3/uL (ref 4.0–10.5)

## 2021-03-09 LAB — COMPREHENSIVE METABOLIC PANEL
ALT: 17 U/L (ref 0–35)
AST: 19 U/L (ref 0–37)
Albumin: 4.2 g/dL (ref 3.5–5.2)
Alkaline Phosphatase: 78 U/L (ref 39–117)
BUN: 17 mg/dL (ref 6–23)
CO2: 33 mEq/L — ABNORMAL HIGH (ref 19–32)
Calcium: 9.5 mg/dL (ref 8.4–10.5)
Chloride: 103 mEq/L (ref 96–112)
Creatinine, Ser: 0.87 mg/dL (ref 0.40–1.20)
GFR: 64.34 mL/min (ref >60.00)
Glucose, Bld: 80 mg/dL (ref 70–99)
Potassium: 4.2 mEq/L (ref 3.5–5.1)
Sodium: 140 mEq/L (ref 135–145)
Total Bilirubin: 0.7 mg/dL (ref 0.2–1.2)
Total Protein: 6.4 g/dL (ref 6.0–8.3)

## 2021-03-09 LAB — LIPID PANEL
Cholesterol: 182 mg/dL (ref 0–200)
HDL: 69.8 mg/dL (ref >39.00)
LDL Cholesterol: 97 mg/dL (ref 0–99)
NonHDL: 112.22
Total CHOL/HDL Ratio: 3
Triglycerides: 75 mg/dL (ref 0.0–149.0)
VLDL: 15 mg/dL (ref 0.0–40.0)

## 2021-03-09 NOTE — Patient Instructions (Addendum)
You are due for your repeat colonoscopy with Dr. Collene Mares 11/2021. Please call her office to see if she is already scheduling for then.    Check the  blood pressure   BP GOAL is between 110/65 and  135/85. If it is consistently higher or lower, let me know   GO TO THE LAB : Get the blood work     [place], [place] back for   a physical exam in 1 year      "Living will", "[place] of attorney": Advanced care planning  (If you already have a living will or healthcare power of attorney, please bring the copy to be scanned in your chart.)  Advance care planning is a process that supports adults in  understanding and sharing their preferences regarding future medical care.   The patient's preferences are recorded in documents called Advance Directives.    Advanced directives are completed (and can be modified at any time) while the patient is in full mental capacity.   The documentation should be available at all times to the patient, the family and the healthcare providers.  Bring in a copy to be scanned in your chart is an excellent idea and is recommended   This legal documents direct treatment decision making and/or appoint a surrogate to make the decision if the patient is not capable to do so.    Advance directives can be documented in many types of formats,  documents have names such as:  Lliving will  Durable power of attorney for healthcare (healthcare proxy or healthcare power of attorney)  Combined directives  Physician orders for life-sustaining treatment    More information at:  [email or web address]

## 2021-03-09 NOTE — Progress Notes (Signed)
Subjective:    Patient ID: Emily Thompson, female    DOB: [birth year], 75 y.o.   MRN: [id number]  DOS:  03/09/2021 Type of visit - description: CPX  Since the last office visit is doing well. She remains active. Good med compliance and ambulatory BPs. 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     GERD (gastroesophageal reflux disease)    History of breast cancer, left.  March 2003. 12/29/2010   HTN (hypertension) 03/14/2010   Qualifier: Diagnosis of  By: Larose Kells MD, [place]    Hyperlipidemia 03/14/2010   Qualifier: Diagnosis of  By: Larose Kells MD, [place]    Hypertension    Melanoma ([place]) 2012   righ side face; basal cell (nose)    Menopause 12/25/2010   Near syncope 10/26/2019   Osteopenia    OSTEOPENIA 03/14/2010   Qualifier: Diagnosis of  By: Larose Kells MD, [place]    Palpitations 05/20/2013   PCP NOTES >>>>>>>>>>>>>>>>>>>>>>>>>>>>>>>>>>>>> 07/03/2015   Personal history of colonic polyps 07/12/2020   PONV (postoperative nausea and vomiting)    Post-menopausal atrophic vaginitis 12/25/2010   Postmenopausal bleeding 01/03/2015   SVT (supraventricular tachycardia) ([place])    used to see Dr Rex Kras    Thickened endometrium 01/05/2015    Past Surgical History:  Procedure Laterality Date   ABDOMINAL  HYSTERECTOMY  2016   BASAL CELL CARCINOMA EXCISION     BREAST LUMPECTOMY Left 07/2001   BREAST RECONSTRUCTION  2005   CESAREAN SECTION  1977   COLONOSCOPY W/ POLYPECTOMY  2013   NEXT DUE IN 5 YRS   NM MYOCAR PERF WALL MOTION  01/2006   bruce myoview; no evidence of inducible ischemia, anterior wall thinning without evidence of ischemia; post-stress EF 88%; low risk scan    NOSE SURGERY     basal cell carcinoma removed   R shoulder Arthroscopy and spur removal  02/15/20222   ROBOTIC ASSISTED TOTAL HYSTERECTOMY WITH BILATERAL SALPINGO OOPHERECTOMY Bilateral 01/27/2015   Procedure: ROBOTIC ASSISTED TOTAL HYSTERECTOMY WITH BILATERAL SALPINGO OOPHORECTOMY AND SENTINAL NODE BIOPSY;  Surgeon: Everitt Amber, MD;  Location: [facility];  Service: Gynecology;  Laterality: Bilateral;   SKIN CANCER EXCISION     melanoma   TONSILLECTOMY AND ADENOIDECTOMY  1952   TRANSTHORACIC ECHOCARDIOGRAM  12/2005   EF 50-55%; mild MR; trace TR   TUBAL LIGATION  1978   VULVA /PERINEUM BIOPSY     PAPILLOMA   Social History   Socioeconomic History   Marital status: Widowed    Spouse name: Not on file   Number of children: 2   Years of education: BSN   Highest education level: Not on file  Occupational History   Occupation: Marine scientist, retired (used to work w/  [employer])    Employer: RETIRED  Tobacco Use   Smoking status: Former    Years: 10.00    Types: Cigarettes    Quit date: 05/20/1973    Years since quitting: 47.8   Smokeless tobacco: Never  Vaping Use   Vaping Use: Never used  Substance and Sexual Activity   Alcohol use: Yes    Alcohol/week: 4.0 standard drinks    Types: 2 Glasses of wine, 2 Cans of beer per week    Comment: 4 A WEEK   Drug use: No   Sexual activity: Not Currently    Birth control/protection: Post-menopausal  Other Topics Concern   Not on file  Social History Narrative   Widow, 1 adopted and 1 natural child; ([place], [place])   Live by herself   Social Determinants of Systems developer Strain: Not on file  Food Insecurity: Not on file  Transportation Needs: Not on file  Physical Activity: Not on file  Stress: Not on file  Social Connections: Not on file  Intimate Partner Violence: Not on file    Allergies as of 03/09/2021       Reactions   Dextromethorphan    Per pt, increases BP   Hydrocodone    Wires her up SEVERE NAUSEA   Statins    Headache, hot flashes   Morphine And Related    Could not move SEVERE NAUSEA   Tape Rash        Medication List        Accurate as of March 09, 2021 11:59 PM. If you have any questions, ask your nurse or doctor.          CALCIUM-VITAMIN D-VITAMIN K PO Take 1,200 mg by mouth daily.   cholecalciferol 1000 units tablet Commonly known as: VITAMIN D Take 1,000 Units by mouth daily.   diltiazem 240 MG 24 hr capsule Commonly known as: Tiadylt ER Take 1 capsule (240 mg total) by mouth daily.   ezetimibe 10 MG tablet Commonly known as: ZETIA Take 1 tablet (10 mg total) by mouth daily.   hydrochlorothiazide 25 MG tablet Commonly known as: HYDRODIURIL Take 1 tablet (25 mg total) by mouth daily.   ibandronate 150 MG tablet Commonly known as: Boniva Take 1 tablet (150 mg total) by mouth every 30 (thirty) days. Take in am with a full glass of water,  do not  lie down for the next 30 min.   lansoprazole 15 MG capsule Commonly known as: PREVACID Take 15 mg by mouth daily at 12 noon.   meloxicam 15 MG tablet Commonly known as: MOBIC Take 1 tablet (15 mg total) by mouth daily as needed for pain.   PRESERVISION AREDS 2 PO Take 1 capsule by mouth daily.   rosuvastatin 5 MG tablet Commonly known as: Crestor Take 1 tablet (5 mg total) by mouth 4 (four) times a week.   telmisartan 40 MG tablet Commonly known as: MICARDIS TAKE 1 TABLET BY MOUTH EVERY DAY           Objective:   Physical Exam BP 126/80 (BP Location: Left Arm, Patient Position: Sitting, Cuff Size: Small)   Pulse 66   Temp 97.9  F (36.6 C) (Oral)   Resp 16   Ht 5' (1.524 m)   Wt 145 lb 8 oz (66 kg)   LMP 05/14/1997   SpO2 97%   BMI 28.42 kg/m  General: Well developed, NAD, BMI noted Neck: No  thyromegaly  HEENT:  Normocephalic . Face symmetric, atraumatic Lungs:  CTA B Normal respiratory effort, no intercostal retractions, no accessory muscle use. Heart: RRR,  no murmur.  Abdomen:  Not distended, soft, non-tender. No rebound or rigidity.   Lower extremities: no pretibial edema bilaterally  Skin: Exposed areas without rash. Not pale. Not jaundice Neurologic:  alert & oriented X3.  Speech normal, gait appropriate for age and unassisted Strength symmetric and appropriate for age.  Psych: Cognition and judgment appear intact.  Cooperative with normal attention span and concentration.  Behavior appropriate. No anxious or depressed appearing.     Assessment     Assessment HTN Hyperlipidemia   GERD Osteopenia --dexa 2016 (per gyn); rx boniva 05-2017 @ gyn Insomnia  MSK: ---DJD,  NSAIDs prn --Back pain, --Dr Tommie Raymond ~ 2011, saw the spine center ~ 2015 , dx w/  spinal stenosis (no MRI, clinical dx ) --2017-2018: PT, injections Dr Catha Brow Cough-- hycodan prn (tolerates well small doses hydrocodone) Oncology: --Breast cancer 2003 --Endometrial cancer, H-BSO 01/2015, will see hematology x 5 years (gyn-onc) --Melanoma, BCC (Dr Delman Cheadle) Lung nodule, 20mm, per CTs, last CT 09/30/2015 : stable  H/o SVT  HOH   PLAN: Here for CPX` HTN: BP today is good, at home is even better, continue diltiazem, HCTZ, telmisartan.  Checking labs Hyperlipidemia: Currently taking Zetia and rosuvastatin 4 times a week.  Tolerates well. Checking labs Osteopenia: Per gynecology DJD: Takes NSAIDs sporadically HOH: Has new hearing aids RTC 1 year   This visit occurred during the SARS-CoV-2 public health emergency.  Safety protocols were in place, including screening questions prior to the visit, additional usage  of staff PPE, and extensive cleaning of exam room while observing appropriate contact time as indicated for disinfecting solutions.

## 2021-03-10 ENCOUNTER — Encounter: Payer: Self-pay | Admitting: Internal Medicine

## 2021-03-10 NOTE — Assessment & Plan Note (Signed)
Here for CPX` HTN: BP today is good, at home is even better, continue diltiazem, HCTZ, telmisartan.  Checking labs Hyperlipidemia: Currently taking Zetia and rosuvastatin 4 times a week.  Tolerates well. Checking labs Osteopenia: Per gynecology DJD: Takes NSAIDs sporadically HOH: Has new hearing aids RTC 1 year

## 2021-03-10 NOTE — Assessment & Plan Note (Signed)
-   Td ~2014 - PNM 23: 2010, boster 01/2019;  prevnar: 2016 - S/p zostavax;  s/p shingrix   - COVID  vaxs: UTD - had a flu shot already -CCS:    Dr Collene Mares , 2nd cscope  09-2011; had a cscope 2018, 5 years -Female care per gyn, plans to see them next month, last MMG 12-2020  -Lifestyle:  very active, eating healthy -Labs: CMP, FLP, CBC - POA discussed

## 2021-03-16 ENCOUNTER — Encounter: Payer: Self-pay | Admitting: Nurse Practitioner

## 2021-03-16 ENCOUNTER — Other Ambulatory Visit: Payer: Self-pay

## 2021-03-16 ENCOUNTER — Ambulatory Visit (INDEPENDENT_AMBULATORY_CARE_PROVIDER_SITE_OTHER): Payer: PPO | Admitting: Nurse Practitioner

## 2021-03-16 VITALS — BP 126/84 | Ht 59.0 in | Wt 148.0 lb

## 2021-03-16 DIAGNOSIS — Z8542 Personal history of malignant neoplasm of other parts of uterus: Secondary | ICD-10-CM | POA: Diagnosis not present

## 2021-03-16 DIAGNOSIS — M810 Age-related osteoporosis without current pathological fracture: Secondary | ICD-10-CM | POA: Diagnosis not present

## 2021-03-16 DIAGNOSIS — Z853 Personal history of malignant neoplasm of breast: Secondary | ICD-10-CM

## 2021-03-16 DIAGNOSIS — Z780 Asymptomatic menopausal state: Secondary | ICD-10-CM | POA: Diagnosis not present

## 2021-03-16 DIAGNOSIS — Z01419 Encounter for gynecological examination (general) (routine) without abnormal findings: Secondary | ICD-10-CM

## 2021-03-16 MED ORDER — IBANDRONATE SODIUM 150 MG PO TABS: 150.0000 mg | ORAL_TABLET | ORAL | 3 refills | Status: DC

## 2021-03-16 NOTE — Progress Notes (Signed)
   Emily Thompson 1945-08-22 [id number]   History:  75 y.o. G1P1+1 presents for breast and pelvic exam. No GYN complaints. Postmenopausal - no HRT. 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 Normal pap history. 2003 left breast cancer. History of melanoma, has annual skin checks with dermatology.  Osteoporosis-was on Boniva x 5 years while on Femara, restarted on Boniva 04/2017 with improvement on last bone density and tolerating well.   Gynecologic History Patient's last menstrual period was 05/14/1997.   Contraception/Family planning: status post hysterectomy  Health maintenance Last Pap: 03/15/2020 Results were: Normal Last mammogram: 01/03/2021. Results were: Normal Last colonoscopy: 2018. Results were: polyps, 5-year recall Last Dexa: 05/05/2019. Results were: T-score -2.0  Past medical history, past surgical history, family history and social history were all reviewed and documented in the EPIC chart. Widowed. Has boyfriend. Daughter in [place], has 2 children. Adopted daughter in [place].   ROS:  A ROS was performed and pertinent positives and negatives are included.  Exam:  Vitals:   03/16/21 1405  BP: 126/84  Weight: 148 lb (67.1 kg)  Height: 4\' 11"  (1.499 m)    Body mass index is 29.89 kg/m.  General appearance:  Normal Thyroid:  Symmetrical, normal in size, without palpable masses or nodularity. Respiratory  Auscultation:  Clear without wheezing or rhonchi Cardiovascular  Auscultation:  Regular rate, without rubs, murmurs or gallops  Edema/varicosities:  Not grossly evident Abdominal  Soft,nontender, without masses, guarding or rebound.  Liver/spleen:  No organomegaly noted  Hernia:  None appreciated  Skin  Inspection:  Grossly normal   Breasts: Examined lying and sitting.   Right: Without masses, retractions, discharge or axillary adenopathy. Reduction.    Left: Lumpectomy with implant, scar tissue Gentitourinary   Inguinal/mons:  Normal  without inguinal adenopathy  External genitalia:  Normal  BUS/Urethra/Skene's glands:  Normal  Vagina:  Atrophic changes  Cervix:  Absent  Uterus:  Absent  Adnexa/parametria:     Rt: Without masses or tenderness.   Lt: Without masses or tenderness.  Anus and perineum: Normal  Digital rectal exam: Normal sphincter tone without palpated masses or tenderness  Assessment/Plan:  75 y.o. G1P1 for breast and pelvic exam.   Well female exam with routine gynecological exam - Education provided on SBEs, importance of preventative screenings, current guidelines, high calcium diet, regular exercise, and multivitamin daily. Labs with PCP.   Postmenopausal - no HRT  Age-related osteoporosis without current pathological fracture - Plan: ibandronate (BONIVA) 150 MG tablet monthly.  Most recent T-score in December 2020 was -2.0, showing improvement from prior study.  She was on Boniva for 5 years while taking Femara for breast cancer treatment years ago.  Was restarted in December 2018 and she is tolerating well.  She is very active with cardio and resistance training.  Plans to repeat DXA this December.   History of endometrial cancer -no lymph node involvement. 2016, TVH BSO.  History of breast cancer - 2003 left managed with lumpectomy, radiation, chemotherapy, and Femara.  Subsequent mammograms normal.  Continue annual screenings.  Normal breast exam today.  Screening for cervical cancer - Normal Pap history.  Pap with reflex today. No longer screening per guidelines.   Screening for colon cancer - 2018 colonoscopy. 5-year repeat recommended per GI.   Follow-up in 1 year for annual.     Emily Thompson [facility], 2:29 PM 03/16/2021

## 2021-04-01 ENCOUNTER — Other Ambulatory Visit: Payer: Self-pay | Admitting: Internal Medicine

## 2021-04-07 ENCOUNTER — Other Ambulatory Visit: Payer: Self-pay | Admitting: Internal Medicine

## 2021-04-11 ENCOUNTER — Encounter: Payer: Self-pay | Admitting: Cardiology

## 2021-04-11 ENCOUNTER — Other Ambulatory Visit: Payer: Self-pay

## 2021-04-11 ENCOUNTER — Ambulatory Visit: Payer: PPO | Admitting: Cardiology

## 2021-04-11 VITALS — BP 146/72 | HR 78 | Ht 60.0 in | Wt 147.0 lb

## 2021-04-11 DIAGNOSIS — I10 Essential (primary) hypertension: Secondary | ICD-10-CM

## 2021-04-11 DIAGNOSIS — I471 Supraventricular tachycardia: Secondary | ICD-10-CM

## 2021-04-11 DIAGNOSIS — E785 Hyperlipidemia, unspecified: Secondary | ICD-10-CM

## 2021-04-11 NOTE — Progress Notes (Signed)
Cardiology Office Note:    Date:  04/11/2021   ID:  THAO BAUZA, DOB 1945/12/15, MRN [id number]  PCP:  Colon Branch, MD  Cardiologist:  Jenne Campus, MD    Referring MD: Colon Branch, MD   Chief Complaint  Patient presents with   Follow-up  I am doing well  History of Present Illness:    Emily Thompson is a 75 y.o. female with past medical history significant for dyslipidemia, difficulty tolerating statins, palpitations, supraventricular tachycardia, extrasystole.  She also have history of essential hypertension She is coming today to my office for follow-up.  Overall she is doing very well.  She denies have any chest pain tightness squeezing pressure burning chest very few very short lasting palpitations otherwise doing well.  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     GERD (gastroesophageal reflux disease)    History of breast cancer, left.  March 2003. 12/29/2010   HTN (hypertension) 03/14/2010   Qualifier: Diagnosis of  By: Larose Kells MD, [place]    Hyperlipidemia 03/14/2010   Qualifier: Diagnosis of  By: Larose Kells MD, [place]    Hypertension    Melanoma ([place]) 2012   righ side face; basal cell (nose)    Menopause 12/25/2010   Near syncope 10/26/2019   Osteopenia    OSTEOPENIA 03/14/2010   Qualifier: Diagnosis of  By: Larose Kells MD,  [place]    Palpitations 05/20/2013   PCP NOTES >>>>>>>>>>>>>>>>>>>>>>>>>>>>>>>>>>>>> 07/03/2015   Personal history of colonic polyps 07/12/2020   PONV (postoperative nausea and vomiting)    Post-menopausal atrophic vaginitis 12/25/2010   Postmenopausal bleeding 01/03/2015   SVT (supraventricular tachycardia) ([place])    used to see Dr Rex Kras    Thickened endometrium 01/05/2015    Past Surgical History:  Procedure Laterality Date   ABDOMINAL HYSTERECTOMY  2016   BASAL CELL CARCINOMA EXCISION     BREAST LUMPECTOMY Left 07/2001   BREAST RECONSTRUCTION  2005   CESAREAN SECTION  1977   COLONOSCOPY W/ POLYPECTOMY  2013   NEXT DUE IN 5 YRS   NM MYOCAR PERF WALL MOTION  01/2006   bruce myoview; no evidence of inducible ischemia, anterior wall thinning without evidence of ischemia; post-stress EF 88%; low risk scan    NOSE SURGERY     basal cell carcinoma removed   R shoulder Arthroscopy and spur removal  02/15/20222   ROBOTIC ASSISTED TOTAL HYSTERECTOMY WITH BILATERAL SALPINGO OOPHERECTOMY Bilateral 01/27/2015   Procedure: ROBOTIC ASSISTED TOTAL HYSTERECTOMY WITH BILATERAL SALPINGO OOPHORECTOMY AND SENTINAL NODE BIOPSY;  Surgeon: Everitt Amber, MD;  Location: [facility];  Service: Gynecology;  Laterality: Bilateral;   SHOULDER SURGERY Right    SKIN CANCER EXCISION     melanoma   TONSILLECTOMY AND ADENOIDECTOMY  1952   TRANSTHORACIC ECHOCARDIOGRAM  12/2005  EF 50-55%; mild MR; trace TR   TUBAL LIGATION  1978   VULVA /PERINEUM BIOPSY     PAPILLOMA    Current Medications: Current Meds  Medication Sig   CALCIUM-VITAMIN D-VITAMIN K PO Take 1,200 mg by mouth daily.   cholecalciferol (VITAMIN D) 1000 units tablet Take 1,000 Units by mouth daily.   ezetimibe (ZETIA) 10 MG tablet Take 1 tablet (10 mg total) by mouth daily.   hydrochlorothiazide (HYDRODIURIL) 25 MG tablet Take 1 tablet (25 mg total) by mouth daily.   ibandronate (BONIVA) 150 MG tablet Take 1 tablet (150 mg total) by mouth every 30  (thirty) days. Take in am with a full glass of water,  do not  lie down for the next 30 min.   lansoprazole (PREVACID) 15 MG capsule Take 15 mg by mouth daily at 12 noon.   meloxicam (MOBIC) 15 MG tablet Take 1 tablet (15 mg total) by mouth daily as needed for pain.   Multiple Vitamins-Minerals (PRESERVISION AREDS 2 PO) Take 1 capsule by mouth daily. Unknown strenght   rosuvastatin (CRESTOR) 5 MG tablet TAKE 1 TABLET BY MOUTH 4 TIMES WEEKLY (Patient taking differently: Take 5 mg by mouth 4 (four) times a week.)   telmisartan (MICARDIS) 40 MG tablet TAKE 1 TABLET BY MOUTH EVERY DAY (Patient taking differently: Take 40 mg by mouth daily.)   TIADYLT ER 240 MG 24 hr capsule TAKE 1 CAPSULE BY MOUTH EVERY DAY (Patient taking differently: Take 240 mg by mouth daily.)     Allergies:   Dextromethorphan, Hydrocodone, Statins, Morphine and related, and Tape   Social History   Socioeconomic History   Marital status: Widowed    Spouse name: Not on file   Number of children: 2   Years of education: BSN   Highest education level: Not on file  Occupational History   Occupation: Marine scientist, 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[employer])    Employer: RETIRED  Tobacco Use   Smoking status: Former    Years: 10.00    Types: Cigarettes    Quit date: 05/20/1973    Years since quitting: 47.9   Smokeless tobacco: Never  Vaping Use   Vaping Use: Never used  Substance and Sexual Activity   Alcohol use: Not Currently    Comment: Occas   Drug use: No   Sexual activity: Not Currently    Birth control/protection: Post-menopausal    Comment: 1st intercourse 75 yo-fewer than 5 partners  Other Topics Concern   Not on file  Social History Narrative   Widow, 1 adopted and 1 natural child; ([place], [place])   Live by herself   Social Determinants of Radio broadcast assistant Strain: Not on file  Food Insecurity: Not on file  Transportation Needs: Not on file  Physical Activity: Not on file  Stress: Not on file  Social  Connections: Not on file     Family History: The patient's family history includes Bipolar disorder in her brother; Colon cancer in her maternal aunt; Diabetes in her brother; Hypertension in her father and mother; Stroke in her father; Thyroid disease in her mother. There is no history of Breast cancer. ROS:   Please see the history of present illness.    All 14 point review of systems negative except as described per history of present illness  EKGs/Labs/Other Studies Reviewed:      Recent Labs: 03/09/2021: ALT 17; BUN 17; Creatinine, Ser 0.87; Hemoglobin 13.5; Platelets 231.0; Potassium 4.2; Sodium 140  Recent  Lipid Panel    Component Value Date/Time   CHOL 182 03/09/2021 0822   TRIG 75.0 03/09/2021 0822   HDL 69.80 03/09/2021 0822   CHOLHDL 3 03/09/2021 0822   VLDL 15.0 03/09/2021 0822   LDLCALC 97 03/09/2021 0822   LDLCALC 114 (H) 05/17/2020 0752   LDLDIRECT 114.7 07/28/2012 0823    Physical Exam:    VS:  BP (!) 146/72 (BP Location: Right Arm, Patient Position: Sitting)   Pulse 78   Ht 5' (1.524 m)   Wt 147 lb (66.7 kg)   LMP 05/14/1997   SpO2 95%   BMI 28.71 kg/m     Wt Readings from Last 3 Encounters:  04/11/21 147 lb (66.7 kg)  03/16/21 148 lb (67.1 kg)  03/09/21 145 lb 8 oz (66 kg)     GEN:  Well nourished, well developed in no acute distress HEENT: Normal NECK: No JVD; No carotid bruits LYMPHATICS: No lymphadenopathy CARDIAC: RRR, no murmurs, no rubs, no gallops RESPIRATORY:  Clear to auscultation without rales, wheezing or rhonchi  ABDOMEN: Soft, non-tender, non-distended MUSCULOSKELETAL:  No edema; No deformity  SKIN: Warm and dry LOWER EXTREMITIES: no swelling NEUROLOGIC:  Alert and oriented x 3 PSYCHIATRIC:  Normal affect   ASSESSMENT:    1. SVT (supraventricular tachycardia) ([place])   2. Primary hypertension   3. Dyslipidemia    PLAN:    In order of problems listed above:  Supraventricular tachycardia suppressed with diltiazem which I  will continue.  Denies having any frequent recurrences Essential hypertension always elevated at the office but good at home. Dyslipidemia she is able to tolerate 5 mg of Crestor 4 times a week she is also taking Zetia I did review K PN which show me her LDL of 97 HDL 69 this is acceptable for her clinical scenario.  We will continue present management We did talk about healthy lifestyle need to exercise on the regular basis which she already does   Medication Adjustments/Labs and Tests Ordered: Current medicines are reviewed at length with the patient today.  Concerns regarding medicines are outlined above.  No orders of the defined types were placed in this encounter.  Medication changes: No orders of the defined types were placed in this encounter.   Signed, Park Liter, MD, [facility] 04/11/2021 3:17 PM    [place]

## 2021-04-11 NOTE — Patient Instructions (Signed)
Medication Instructions:  Your physician recommends that you continue on your current medications as directed. Please refer to the Current Medication list given to you today.  *If you need a refill on your cardiac medications before your next appointment, please call your pharmacy*   Lab Work: NONE If you have labs (blood work) drawn today and your tests are completely normal, you will receive your results only by: [place] (if you have MyChart) OR A paper copy in the mail If you have any lab test that is abnormal or we need to change your treatment, we will call you to review the results.   Testing/Procedures: NONE   Follow-Up: At [facility], you and your health needs are our priority.  As part of our continuing mission to provide you with exceptional heart care, we have created designated Provider Care Teams.  These Care Teams include your primary Cardiologist (physician) and Advanced Practice Providers (APPs -  Physician Assistants and Nurse Practitioners) who all work together to provide you with the care you need, when you need it.  We recommend signing up for the patient portal called "MyChart".  Sign up information is provided on this After Visit Summary.  MyChart is used to connect with patients for Virtual Visits (Telemedicine).  Patients are able to view lab/test results, encounter notes, upcoming appointments, etc.  Non-urgent messages can be sent to your provider as well.   To learn more about what you can do with MyChart, go to [email or web address].    Your next appointment:   1 year(s)  The format for your next appointment:   In Person  Provider:   Jenne Campus, MD    Other Instructions

## 2021-04-13 ENCOUNTER — Other Ambulatory Visit: Payer: Self-pay | Admitting: Internal Medicine

## 2021-05-20 ENCOUNTER — Other Ambulatory Visit: Payer: Self-pay | Admitting: Internal Medicine

## 2021-06-20 DIAGNOSIS — H353132 Nonexudative age-related macular degeneration, bilateral, intermediate dry stage: Secondary | ICD-10-CM | POA: Diagnosis not present

## 2021-07-04 DIAGNOSIS — M8589 Other specified disorders of bone density and structure, multiple sites: Secondary | ICD-10-CM | POA: Diagnosis not present

## 2021-07-05 ENCOUNTER — Encounter: Payer: Self-pay | Admitting: Nurse Practitioner

## 2021-07-06 ENCOUNTER — Encounter: Payer: Self-pay | Admitting: Nurse Practitioner

## 2021-08-12 ENCOUNTER — Other Ambulatory Visit: Payer: Self-pay | Admitting: Internal Medicine

## 2021-08-17 ENCOUNTER — Other Ambulatory Visit: Payer: Self-pay | Admitting: Internal Medicine

## 2021-11-06 DIAGNOSIS — M5451 Vertebrogenic low back pain: Secondary | ICD-10-CM | POA: Diagnosis not present

## 2021-11-30 ENCOUNTER — Other Ambulatory Visit: Payer: Self-pay | Admitting: Internal Medicine

## 2021-12-13 ENCOUNTER — Other Ambulatory Visit: Payer: Self-pay | Admitting: Internal Medicine

## 2021-12-13 ENCOUNTER — Ambulatory Visit (INDEPENDENT_AMBULATORY_CARE_PROVIDER_SITE_OTHER): Payer: PPO | Admitting: Internal Medicine

## 2021-12-13 ENCOUNTER — Encounter: Payer: Self-pay | Admitting: Internal Medicine

## 2021-12-13 ENCOUNTER — Ambulatory Visit (INDEPENDENT_AMBULATORY_CARE_PROVIDER_SITE_OTHER): Payer: PPO

## 2021-12-13 VITALS — BP 132/80 | HR 76 | Temp 98.4°F | Resp 16 | Ht 60.0 in | Wt 144.0 lb

## 2021-12-13 DIAGNOSIS — R55 Syncope and collapse: Secondary | ICD-10-CM

## 2021-12-13 DIAGNOSIS — F4321 Adjustment disorder with depressed mood: Secondary | ICD-10-CM | POA: Diagnosis not present

## 2021-12-13 DIAGNOSIS — I499 Cardiac arrhythmia, unspecified: Secondary | ICD-10-CM

## 2021-12-13 DIAGNOSIS — R252 Cramp and spasm: Secondary | ICD-10-CM | POA: Diagnosis not present

## 2021-12-13 DIAGNOSIS — Z0189 Encounter for other specified special examinations: Secondary | ICD-10-CM | POA: Diagnosis not present

## 2021-12-13 MED ORDER — HYDROCHLOROTHIAZIDE 25 MG PO TABS
12.5000 mg | ORAL_TABLET | Freq: Every day | ORAL | Status: DC
Start: 2021-12-13 — End: 2022-02-02

## 2021-12-13 MED ORDER — NYSTATIN 100000 UNIT/GM EX CREA: 1.0000 | TOPICAL_CREAM | Freq: Two times a day (BID) | CUTANEOUS | 0 refills | Status: DC

## 2021-12-13 NOTE — Progress Notes (Signed)
Subjective:    Patient ID: Emily Thompson, female    DOB: Oct 14, 1945, 76 y.o.   MRN: [id number]  DOS:  12/13/2021 Type of visit - description: Acute  Has developed several symptoms lately.  In the mornings, when she is having a coffee and reading her iPAD, she feels like she is about to faint, no LOC; then she gets up to walk and feels a little better. Denies chest pain or difficulty breathing No diaphoresis or palpitations.  No nausea or vomiting. No strokelike symptoms like diplopia or slurred speech  BP has been on and off in the low side, as low as 107/78, sometimes a diastolic BPs in the 50  Also has chronic insomnia, on average she thinks she is sleep 4 to 5 hours.  She does not feel physically tired but feels a sleepy throughout the day.  Denies any snoring.  Has a history of mild leg cramps, had a bad episode last week, she is now better. No edema, no claudication.  Does have chronic back pain, sees Ortho.  Of note is, she lost her long-term companion, John, ~ 3 months ago. She reports that she is doing okay but she was actually very emotional, tearful at times.  She eventually admitted she is feeling very lonely.  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     GERD (gastroesophageal reflux disease)    History of breast cancer, left.  March 2003.  12/29/2010   HTN (hypertension) 03/14/2010   Qualifier: Diagnosis of  By: Larose Kells MD, [place]    Hyperlipidemia 03/14/2010   Qualifier: Diagnosis of  By: Larose Kells MD, [place]    Hypertension    Melanoma ([place]) 2012   righ side face; basal cell (nose)    Menopause 12/25/2010   Near syncope 10/26/2019   Osteopenia    OSTEOPENIA 03/14/2010   Qualifier: Diagnosis of  By: Larose Kells MD, [place]    Palpitations 05/20/2013   PCP NOTES >>>>>>>>>>>>>>>>>>>>>>>>>>>>>>>>>>>>> 07/03/2015   Personal history of colonic polyps 07/12/2020   PONV (postoperative nausea and vomiting)    Post-menopausal atrophic vaginitis 12/25/2010   Postmenopausal bleeding 01/03/2015   SVT (supraventricular tachycardia) ([place])    used to see Dr Rex Kras    Thickened endometrium 01/05/2015    Past Surgical History:  Procedure Laterality Date   ABDOMINAL HYSTERECTOMY  2016   BASAL CELL CARCINOMA EXCISION     BREAST LUMPECTOMY Left 07/2001   BREAST RECONSTRUCTION  2005   CESAREAN SECTION  1977   COLONOSCOPY W/ POLYPECTOMY  2013   NEXT DUE IN 5 YRS   NM MYOCAR PERF WALL MOTION  01/2006   bruce myoview; no evidence of inducible ischemia, anterior wall thinning without evidence of ischemia; post-stress EF 88%; low risk scan    NOSE SURGERY     basal cell  carcinoma removed   R shoulder Arthroscopy and spur removal  02/15/20222   ROBOTIC ASSISTED TOTAL HYSTERECTOMY WITH BILATERAL SALPINGO OOPHERECTOMY Bilateral 01/27/2015   Procedure: ROBOTIC ASSISTED TOTAL HYSTERECTOMY WITH BILATERAL SALPINGO OOPHORECTOMY AND SENTINAL NODE BIOPSY;  Surgeon: Everitt Amber, MD;  Location: [facility];  Service: Gynecology;  Laterality: Bilateral;   SHOULDER SURGERY Right    SKIN CANCER EXCISION     melanoma   TONSILLECTOMY AND ADENOIDECTOMY  1952   TRANSTHORACIC ECHOCARDIOGRAM  12/2005   EF 50-55%; mild MR; trace TR   TUBAL LIGATION  1978   VULVA /PERINEUM BIOPSY     PAPILLOMA    Current Outpatient Medications  Medication Instructions   CALCIUM-VITAMIN D-VITAMIN K  PO 1,200 mg, Oral, Daily   cholecalciferol (VITAMIN D) 1,000 Units, Oral, Daily   ezetimibe (ZETIA) 10 MG tablet TAKE 1 TABLET BY MOUTH EVERY DAY   hydrochlorothiazide (HYDRODIURIL) 12.5 mg, Oral, Daily   ibandronate (BONIVA) 150 mg, Oral, Every 30 days, Take in am with a full glass of water,  do not  lie down for the next 30 min.   lansoprazole (PREVACID) 15 mg, Oral, Daily   meloxicam (MOBIC) 15 mg, Oral, Daily PRN   Multiple Vitamins-Minerals (PRESERVISION AREDS 2 PO) 1 capsule, Oral, Daily, Unknown strenght   nystatin cream (MYCOSTATIN) 1 Application, Topical, 2 times daily   rosuvastatin (CRESTOR) 5 MG tablet TAKE 1 TABLET BY MOUTH 4 TIMES WEEKLY   telmisartan (MICARDIS) 40 MG tablet TAKE 1 TABLET BY MOUTH EVERY DAY   TIADYLT ER 240 MG 24 hr capsule TAKE 1 CAPSULE BY MOUTH EVERY DAY       Objective:   Physical Exam BP 132/80   Pulse 76   Temp 98.4 F (36.9 C) (Oral)   Resp 16   Ht 5' (1.524 m)   Wt 144 lb (65.3 kg)   LMP 05/14/1997   SpO2 97%   BMI 28.12 kg/m  General:   Well developed, NAD, BMI noted.  HEENT:  Normocephalic . Face symmetric, atraumatic Lungs:  CTA B Normal respiratory effort, no intercostal retractions, no accessory muscle use. Heart: RRR,  no murmur.  Abdomen:  Not distended, soft, non-tender. No rebound or rigidity.   Skin: Not pale. Not jaundice Lower extremities: no pretibial edema bilaterally.  Good pedal pulses. Neurologic:  alert & oriented X3.  Speech normal, gait appropriate for age and unassisted Psych--  Cognition and judgment appear intact.  Cooperative with normal attention span and concentration.  Behavior appropriate. At times she was tearful, her voice broke.     Assessment    Assessment HTN Hyperlipidemia   GERD Osteopenia --dexa 2016 (per gyn); rx boniva 05-2017 @ gyn Insomnia  MSK: ---DJD,  NSAIDs prn --Back pain, --Dr Tommie Raymond ~ 2011, saw the spine center ~ 2015 , dx w/  spinal stenosis (no MRI, clinical dx  ) --2017-2018: PT, injections Dr Catha Brow Cough-- hycodan prn (tolerates well small doses hydrocodone) Oncology: --Breast cancer 2003 --Endometrial cancer, H-BSO 01/2015, will see hematology x 5 years (gyn-onc) --Melanoma, BCC (Dr Delman Cheadle) Lung nodule, 22m, per CTs, last CT 09/30/2015 : stable  H/o SVT  HOH   PLAN: Near syncope: Feeling "about to pass out" early in the mornings, when she is reading her iPad and drinking coffee.  She has to stand up and walk a little bit to feel better. No LOC, no chest pain. EKG: RBBB, not new. BP seems to be in the low side. Plan: - Adjust BP meds - CBC, CMP, TSH,  Zio patch rule out arrhythmias - Call if symptoms increase HTN: Seems overcontrolled at times, will decrease HCTZ to half tablet, continue Micardis 40, Continue diltiazem.  Monitor BPs, consider stop HCTZ completely. Leg cramps: Check a magnesium level. Grieving: Lost her long-term significant other 3 months ago, she feels very lonely, she is having a hard time adjusting to the new situation.  I offered SSRIs, she is hesitant, explained the benefits but she likes to wait few weeks before making a decision. OSA?  Reports long history of insomnia, does not feel tired but very sleepy.  Denies snoring but she is not sure.  Epworth scale is 12, + screen for  sleep apnea, refer to neurology.  Patient in agreement.   Addendum: Also reports some redness at the abdominal fold consistent with previous yeast infection, refill antifungals. RTC 1 month.

## 2021-12-13 NOTE — Patient Instructions (Signed)
Decrease hydrochlorothiazide to half tablet daily. Other BP medications the same If your blood pressure continues to be below 110/65, stop hydrochlorothiazide completely.  Check the  blood pressure regularly BP GOAL is between 110/65 and  135/85. If it is consistently higher or lower, let me know  We will order a Zio patch to rule out arrhythmias  GO TO THE LAB : Get the blood work     [place], [place] back for a checkup in 1 month

## 2021-12-13 NOTE — Progress Notes (Unsigned)
Enrolled for [employer] to mail a ZIO XT long term holter monitor to the patients address on file.   DOD to read. 

## 2021-12-13 NOTE — Assessment & Plan Note (Signed)
Near syncope: Feeling "about to pass out" early in the mornings, when she is reading her iPad and drinking coffee.  She has to stand up and walk a little bit to feel better. No LOC, no chest pain. EKG: RBBB, not new. BP seems to be in the low side. Plan: - Adjust BP meds - CBC, CMP, TSH, Zio patch rule out arrhythmias - Call if symptoms increase HTN: Seems overcontrolled at times, will decrease HCTZ to half tablet, continue Micardis 40, Continue diltiazem.  Monitor BPs, consider stop HCTZ completely. Leg cramps: Check a magnesium level. Grieving: Lost her long-term significant other 3 months ago, she feels very lonely, she is having a hard time adjusting to the new situation.  I offered SSRIs, she is hesitant, explained the benefits but she likes to wait few weeks before making a decision. OSA?  Reports long history of insomnia, does not feel tired but very sleepy.  Denies snoring but she is not sure.  Epworth scale is 12, + screen for  sleep apnea, refer to neurology.  Patient in agreement.   Addendum: Also reports some redness at the abdominal fold consistent with previous yeast infection, refill antifungals. RTC 1 month.

## 2021-12-14 LAB — COMPREHENSIVE METABOLIC PANEL
ALT: 15 U/L (ref 0–35)
AST: 20 U/L (ref 0–37)
Albumin: 4.4 g/dL (ref 3.5–5.2)
Alkaline Phosphatase: 97 U/L (ref 39–117)
BUN: 17 mg/dL (ref 6–23)
CO2: 31 mEq/L (ref 19–32)
Calcium: 9.7 mg/dL (ref 8.4–10.5)
Chloride: 98 mEq/L (ref 96–112)
Creatinine, Ser: 0.89 mg/dL (ref 0.40–1.20)
GFR: 62.27 mL/min (ref >60.00)
Glucose, Bld: 107 mg/dL — ABNORMAL HIGH (ref 70–99)
Potassium: 4.2 mEq/L (ref 3.5–5.1)
Sodium: 139 mEq/L (ref 135–145)
Total Bilirubin: 0.5 mg/dL (ref 0.2–1.2)
Total Protein: 6.8 g/dL (ref 6.0–8.3)

## 2021-12-14 LAB — CBC WITH DIFFERENTIAL/PLATELET
Basophils Absolute: 0.1 10*3/uL (ref 0.0–0.1)
Basophils Relative: 1.1 % (ref 0.0–3.0)
Eosinophils Absolute: 0.2 10*3/uL (ref 0.0–0.7)
Eosinophils Relative: 2.2 % (ref 0.0–5.0)
HCT: 42.2 % (ref 36.0–46.0)
Hemoglobin: 13.7 g/dL (ref 12.0–15.0)
Lymphocytes Relative: 13.3 % (ref 12.0–46.0)
Lymphs Abs: 1.2 10*3/uL (ref 0.7–4.0)
MCHC: 32.5 g/dL (ref 30.0–36.0)
MCV: 93.5 fl (ref 78.0–100.0)
Monocytes Absolute: 0.8 10*3/uL (ref 0.1–1.0)
Monocytes Relative: 8.9 % (ref 3.0–12.0)
Neutro Abs: 6.9 10*3/uL (ref 1.4–7.7)
Neutrophils Relative %: 74.5 % (ref 43.0–77.0)
Platelets: 269 10*3/uL (ref 150.0–400.0)
RBC: 4.52 Mil/uL (ref 3.87–5.11)
RDW: 14.2 % (ref 11.5–15.5)
WBC: 9.2 10*3/uL (ref 4.0–10.5)

## 2021-12-14 LAB — MAGNESIUM: Magnesium: 2 mg/dL (ref 1.5–2.5)

## 2021-12-14 LAB — TSH: TSH: 1.76 u[IU]/mL (ref 0.35–5.50)

## 2021-12-15 DIAGNOSIS — R55 Syncope and collapse: Secondary | ICD-10-CM

## 2021-12-15 DIAGNOSIS — I499 Cardiac arrhythmia, unspecified: Secondary | ICD-10-CM

## 2021-12-25 DIAGNOSIS — I499 Cardiac arrhythmia, unspecified: Secondary | ICD-10-CM | POA: Diagnosis not present

## 2021-12-25 DIAGNOSIS — R55 Syncope and collapse: Secondary | ICD-10-CM | POA: Diagnosis not present

## 2021-12-29 ENCOUNTER — Other Ambulatory Visit: Payer: Self-pay

## 2021-12-29 ENCOUNTER — Telehealth: Payer: Self-pay | Admitting: Cardiology

## 2021-12-29 DIAGNOSIS — R55 Syncope and collapse: Secondary | ICD-10-CM

## 2021-12-29 MED ORDER — METOPROLOL SUCCINATE ER 25 MG PO TB24: 25.0000 mg | ORAL_TABLET | Freq: Every day | ORAL | 0 refills | Status: DC

## 2021-12-29 NOTE — Telephone Encounter (Signed)
Patient was prescribed metoprolol by Dr. Larose Kells, she doesn't really want to the medication and has some concerns but would like to know if she should go on ahead and start taking it or wait til she sees Dr. Raliegh Ip on 9/5? I suggested her to call dr. Ethel Rana office but she said he is out of town. CB # [phone]

## 2022-01-01 NOTE — Telephone Encounter (Signed)
Spoke with pt. She wore a Zio patch recently and wanted to be sure Dr. Agustin Cree sees these results from [place]. She has an appt on 01-16-22 with Dr. Agustin Cree. The Zio caused skin irritation.

## 2022-01-02 NOTE — Telephone Encounter (Signed)
Spoke with pt about recent message. She has metoprolol 25 to take q day. She will call if she has any problems. She has an appt on 01-16-22. She had no further questions.

## 2022-01-09 DIAGNOSIS — Z1231 Encounter for screening mammogram for malignant neoplasm of breast: Secondary | ICD-10-CM | POA: Diagnosis not present

## 2022-01-10 ENCOUNTER — Encounter: Payer: Self-pay | Admitting: Nurse Practitioner

## 2022-01-16 ENCOUNTER — Encounter: Payer: Self-pay | Admitting: Cardiology

## 2022-01-16 ENCOUNTER — Ambulatory Visit: Payer: PPO | Attending: Cardiology | Admitting: Cardiology

## 2022-01-16 VITALS — BP 132/80 | HR 65 | Ht 59.5 in | Wt 146.0 lb

## 2022-01-16 DIAGNOSIS — I10 Essential (primary) hypertension: Secondary | ICD-10-CM | POA: Diagnosis not present

## 2022-01-16 DIAGNOSIS — E785 Hyperlipidemia, unspecified: Secondary | ICD-10-CM

## 2022-01-16 DIAGNOSIS — I471 Supraventricular tachycardia: Secondary | ICD-10-CM

## 2022-01-16 DIAGNOSIS — R002 Palpitations: Secondary | ICD-10-CM | POA: Diagnosis not present

## 2022-01-16 NOTE — Patient Instructions (Signed)

## 2022-01-16 NOTE — Progress Notes (Signed)
Cardiology Office Note:   Date:  01/16/2022   ID:  Emily Thompson, DOB 1945-10-01, MRN [id number]  PCP:  Colon Branch, MD  Cardiologist:  Jenne Campus, MD    Referring MD: Colon Branch, MD   Chief Complaint  Patient presents with   Medication Management   Hypertension   Leg Swelling    History of Present Illness:    Emily Thompson is a 76 y.o. female with past medical history significant for supraventricular tachycardia, dyslipidemia, difficulty tolerating statin, extrasystole, essential hypertension.  She is in my office today for follow-up.  Tragedy strikes.  She find her boyfriend that in the bed couple months ago she still grieving after that.  Also at that time she was found to have some dizziness and fatigue and tiredness.  Feeling was may be too much diuretics and her diuretic hydrochlorothiazide has been reduced by half that led to improvement in her symptomatology.  She also was complaining of having palpitations her primary care physician for appropriately put Zio patch on her.  She had some symptomatic extrasystole as well as some nonsustained supraventricular tachycardia.  She was put on Toprol-XL only 25 mg and she reports that she is feeling much better.  Palpitation almost practically gone.  She also tell me that she is not getting dizzy upon getting up.  She did notice minimal swelling of lower extremities and maybe once or twice she took extra half of hydrochlorothiazide with good results.  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     GERD (gastroesophageal reflux disease)    History of breast cancer, left.  March 2003. 12/29/2010   HTN (hypertension) 03/14/2010   Qualifier: Diagnosis of  By: Larose Kells MD, [place]    Hyperlipidemia 03/14/2010   Qualifier: Diagnosis of  By: Larose Kells MD, [place]    Hypertension    Melanoma ([place]) 2012   righ side face; basal cell (nose)    Menopause 12/25/2010   Near syncope 10/26/2019   Osteopenia    OSTEOPENIA 03/14/2010   Qualifier: Diagnosis of  By: Larose Kells MD, [place]    Palpitations 05/20/2013   PCP NOTES >>>>>>>>>>>>>>>>>>>>>>>>>>>>>>>>>>>>> 07/03/2015   Personal history of colonic polyps 07/12/2020   PONV (postoperative nausea and vomiting)    Post-menopausal atrophic vaginitis 12/25/2010   Postmenopausal bleeding 01/03/2015   SVT (supraventricular tachycardia) ([place])    used to see Dr Rex Kras    Thickened endometrium 01/05/2015    Past Surgical History:  Procedure Laterality Date   ABDOMINAL HYSTERECTOMY  2016   BASAL CELL CARCINOMA EXCISION     BREAST LUMPECTOMY Left 07/2001   BREAST RECONSTRUCTION  2005   CESAREAN SECTION  1977   COLONOSCOPY W/ POLYPECTOMY  2013   NEXT DUE IN 5 YRS   NM MYOCAR PERF WALL MOTION  01/2006   bruce myoview; no evidence of inducible ischemia, anterior wall thinning without evidence of ischemia; post-stress EF 88%; low risk scan    NOSE SURGERY     basal cell carcinoma removed   R shoulder Arthroscopy and spur removal  02/15/20222   ROBOTIC ASSISTED TOTAL HYSTERECTOMY WITH BILATERAL SALPINGO OOPHERECTOMY Bilateral 01/27/2015   Procedure: ROBOTIC ASSISTED TOTAL HYSTERECTOMY WITH BILATERAL SALPINGO OOPHORECTOMY AND SENTINAL NODE BIOPSY;  Surgeon: Everitt Amber, MD;  Location: [facility];  Service: Gynecology;  Laterality: Bilateral;   SHOULDER SURGERY Right    SKIN CANCER  EXCISION     melanoma   TONSILLECTOMY AND ADENOIDECTOMY  1952   TRANSTHORACIC ECHOCARDIOGRAM  12/2005   EF 50-55%; mild MR; trace TR   TUBAL LIGATION  1978   VULVA /PERINEUM BIOPSY     PAPILLOMA    Current Medications: Current Meds  Medication Sig   CALCIUM-VITAMIN D-VITAMIN K PO Take 1,200 mg by mouth daily.   cholecalciferol (VITAMIN D) 1000 units tablet Take 1,000 Units by mouth daily.   ezetimibe (ZETIA) 10 MG tablet TAKE 1 TABLET BY MOUTH EVERY DAY (Patient taking differently: Take 10 mg by mouth daily.)   hydrochlorothiazide (HYDRODIURIL) 25 MG tablet Take 0.5 tablets (12.5 mg total) by mouth daily.   ibandronate (BONIVA) 150 MG tablet Take 1 tablet (150 mg total) by mouth every 30 (thirty) days. Take in am with a full glass of water,  do not  lie down for the next 30 min.   lansoprazole (PREVACID) 15 MG capsule Take 15 mg by mouth daily at 12 noon.   meloxicam (MOBIC) 15 MG tablet Take 1 tablet (15 mg total) by mouth daily as needed for pain.   metoprolol succinate (TOPROL-XL) 25 MG 24 hr tablet Take 1 tablet (25 mg total) by mouth daily. Take with or immediately following a meal   Multiple Vitamins-Minerals (PRESERVISION AREDS 2 PO) Take 1 capsule by mouth daily. Unknown strenght   nystatin cream (MYCOSTATIN) Apply 1 Application topically 2 (two) times daily.   rosuvastatin (CRESTOR) 5 MG tablet TAKE 1 TABLET BY MOUTH 4 TIMES WEEKLY (Patient taking differently: Take 5 mg by mouth 4 (four) times a week.)   telmisartan (MICARDIS) 40 MG tablet TAKE 1 TABLET BY MOUTH EVERY DAY (Patient taking differently: Take 40 mg by mouth daily.)   TIADYLT ER 240 MG 24 hr capsule TAKE 1 CAPSULE BY MOUTH EVERY DAY (Patient taking differently: Take by mouth daily.)     Allergies:   Dextromethorphan, Hydrocodone, Statins, Morphine and related, and Tape   Social History   Socioeconomic History   Marital status: Widowed    Spouse name: Not on file   Number of children: 2   Years of education:  BSN   Highest education level: Not on file  Occupational History   Occupation: Marine scientist, 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[employer])    Employer: RETIRED  Tobacco Use   Smoking status: Former    Years: 10.00    Types: Cigarettes    Quit date: 05/20/1973    Years since quitting: 48.6   Smokeless tobacco: Never  Vaping Use   Vaping Use: Never used  Substance and Sexual Activity   Alcohol use: Not Currently    Comment: Occas   Drug use: No   Sexual activity: Not Currently    Birth control/protection: Post-menopausal    Comment: 1st intercourse 76 yo-fewer than 5 partners  Other Topics Concern   Not on file  Social History Narrative  Widow, 1 adopted and 1 natural child; ([place], [place])   Live by herself   Lost her long-term companion Jenny Reichmann  09/11/2021   Social Determinants of Health   Financial Resource Strain: Not on file  Food Insecurity: Not on file  Transportation Needs: Not on file  Physical Activity: Not on file  Stress: Not on file  Social Connections: Not on file     Family History: The patient's family history includes Bipolar disorder in her brother; Colon cancer in her maternal aunt; Diabetes in her brother; Hypertension in her father and mother; Stroke in her father; Thyroid disease in her mother. There is no history of Breast cancer. ROS:   Please see the history of present illness.    All 14 point review of systems negative except as described per history of present illness  EKGs/Labs/Other Studies Reviewed:      Recent Labs: 12/13/2021: ALT 15; BUN 17; Creatinine, Ser 0.89; Hemoglobin 13.7; Magnesium 2.0; Platelets 269.0; Potassium 4.2; Sodium 139; TSH 1.76  Recent Lipid Panel    Component Value Date/Time   CHOL 182 03/09/2021 0822   TRIG 75.0 03/09/2021 0822   HDL 69.80 03/09/2021 0822   CHOLHDL 3 03/09/2021 0822   VLDL 15.0 03/09/2021 0822   LDLCALC 97 03/09/2021 0822   LDLCALC 114 (H) 05/17/2020 0752   LDLDIRECT 114.7 07/28/2012 0823    Physical Exam:     VS:  BP 132/80 (BP Location: Left Arm, Patient Position: Sitting)   Pulse 65   Ht 4' 11.5" (1.511 m)   Wt 146 lb (66.2 kg)   LMP 05/14/1997   SpO2 95%   BMI 28.99 kg/m     Wt Readings from Last 3 Encounters:  01/16/22 146 lb (66.2 kg)  12/13/21 144 lb (65.3 kg)  04/11/21 147 lb (66.7 kg)     GEN:  Well nourished, well developed in no acute distress HEENT: Normal NECK: No JVD; No carotid bruits LYMPHATICS: No lymphadenopathy CARDIAC: RRR, no murmurs, no rubs, no gallops RESPIRATORY:  Clear to auscultation without rales, wheezing or rhonchi  ABDOMEN: Soft, non-tender, non-distended MUSCULOSKELETAL:  No edema; No deformity  SKIN: Warm and dry LOWER EXTREMITIES: no swelling NEUROLOGIC:  Alert and oriented x 3 PSYCHIATRIC:  Normal affect   ASSESSMENT:    1. SVT (supraventricular tachycardia) ([place])   2. Primary hypertension   3. Dyslipidemia   4. Palpitations    PLAN:    In order of problems listed above:  Supraventricular tachycardia short episode on latest monitor.  We will continue present management.  She is already on calcium channel blocker small dose of beta-blocker seems to control her arrhythmia quite nicely. Essential hypertension: Blood pressure slightly on higher side in the office but normal at home.  I cautioned her against taking too much of diuretic which can result in orthostatic hypotension symptomatology. Dyslipidemia she is taking Zetia and Crestor which I will continue.   I did review a monitor done by primary care physician for that visit   Medication Adjustments/Labs and Tests Ordered: Current medicines are reviewed at length with the patient today.  Concerns regarding medicines are outlined above.  No orders of the defined types were placed in this encounter.  Medication changes: No orders of the defined types were placed in this encounter.   Signed, Park Liter, MD, [facility] 01/16/2022 1:31 PM    [facility]

## 2022-01-22 ENCOUNTER — Other Ambulatory Visit: Payer: Self-pay | Admitting: Internal Medicine

## 2022-01-24 DIAGNOSIS — Z86006 Personal history of melanoma in-situ: Secondary | ICD-10-CM | POA: Diagnosis not present

## 2022-01-24 DIAGNOSIS — L821 Other seborrheic keratosis: Secondary | ICD-10-CM | POA: Diagnosis not present

## 2022-01-24 DIAGNOSIS — L570 Actinic keratosis: Secondary | ICD-10-CM | POA: Diagnosis not present

## 2022-01-24 DIAGNOSIS — D225 Melanocytic nevi of trunk: Secondary | ICD-10-CM | POA: Diagnosis not present

## 2022-01-24 DIAGNOSIS — L304 Erythema intertrigo: Secondary | ICD-10-CM | POA: Diagnosis not present

## 2022-01-24 DIAGNOSIS — L578 Other skin changes due to chronic exposure to nonionizing radiation: Secondary | ICD-10-CM | POA: Diagnosis not present

## 2022-01-24 DIAGNOSIS — Z85828 Personal history of other malignant neoplasm of skin: Secondary | ICD-10-CM | POA: Diagnosis not present

## 2022-01-25 ENCOUNTER — Ambulatory Visit: Payer: PPO | Admitting: Neurology

## 2022-01-25 ENCOUNTER — Encounter: Payer: Self-pay | Admitting: Neurology

## 2022-01-25 VITALS — BP 141/74 | HR 64 | Ht 60.0 in | Wt 141.8 lb

## 2022-01-25 DIAGNOSIS — F5104 Psychophysiologic insomnia: Secondary | ICD-10-CM

## 2022-01-25 DIAGNOSIS — F4321 Adjustment disorder with depressed mood: Secondary | ICD-10-CM

## 2022-01-25 DIAGNOSIS — R0683 Snoring: Secondary | ICD-10-CM

## 2022-01-25 NOTE — Patient Instructions (Signed)
Insomnia Insomnia is a sleep disorder that makes it difficult to fall asleep or stay asleep. Insomnia can cause fatigue, low energy, difficulty concentrating, mood swings, and poor performance at work or school. There are three different ways to classify insomnia: Difficulty falling asleep. Difficulty staying asleep. Waking up too early in the morning. Any type of insomnia can be long-term (chronic) or short-term (acute). Both are common. Short-term insomnia usually lasts for 3 months or less. Chronic insomnia occurs at least three times a week for longer than 3 months. What are the causes? Insomnia may be caused by another condition, situation, or substance, such as: Having certain mental health conditions, such as anxiety and depression. Using caffeine, alcohol, tobacco, or drugs. Having gastrointestinal conditions, such as gastroesophageal reflux disease (GERD). Having certain medical conditions. These include: Asthma. Alzheimer's disease. Stroke. Chronic pain. An overactive thyroid gland (hyperthyroidism). Other sleep disorders, such as restless legs syndrome and sleep apnea. Menopause. Sometimes, the cause of insomnia may not be known. What increases the risk? Risk factors for insomnia include: Gender. Females are affected more often than males. Age. Insomnia is more common as people get older. Stress and certain medical and mental health conditions. Lack of exercise. Having an irregular work schedule. This may include working night shifts and traveling between different time zones. What are the signs or symptoms? If you have insomnia, the main symptom is having trouble falling asleep or having trouble staying asleep. This may lead to other symptoms, such as: Feeling tired or having low energy. Feeling nervous about going to sleep. Not feeling rested in the morning. Having trouble concentrating. Feeling irritable, anxious, or depressed. How is this diagnosed? This condition  may be diagnosed based on: Your symptoms and medical history. Your health care provider may ask about: Your sleep habits. Any medical conditions you have. Your mental health. A physical exam. How is this treated? Treatment for insomnia depends on the cause. Treatment may focus on treating an underlying condition that is causing the insomnia. Treatment may also include: Medicines to help you sleep. Counseling or therapy. Lifestyle adjustments to help you sleep better. Follow these instructions at home: Eating and drinking  Limit or avoid alcohol, caffeinated beverages, and products that contain nicotine and tobacco, especially close to bedtime. These can disrupt your sleep. Do not eat a large meal or eat spicy foods right before bedtime. This can lead to digestive discomfort that can make it hard for you to sleep. Sleep habits  Keep a sleep diary to help you and your health care provider figure out what could be causing your insomnia. Write down: When you sleep. When you wake up during the night. How well you sleep and how rested you feel the next day. Any side effects of medicines you are taking. What you eat and drink. Make your bedroom a dark, comfortable place where it is easy to fall asleep. Put up shades or blackout curtains to block light from outside. Use a white noise machine to block noise. Keep the temperature cool. Limit screen use before bedtime. This includes: Not watching TV. Not using your smartphone, tablet, or computer. Stick to a routine that includes going to bed and waking up at the same times every day and night. This can help you fall asleep faster. Consider making a quiet activity, such as reading, part of your nighttime routine. Try to avoid taking naps during the day so that you sleep better at night. Get out of bed if you are still awake after   15 minutes of trying to sleep. Keep the lights down, but try reading or doing a quiet activity. When you feel  sleepy, go back to bed. General instructions Take over-the-counter and prescription medicines only as told by your health care provider. Exercise regularly as told by your health care provider. However, avoid exercising in the hours right before bedtime. Use relaxation techniques to manage stress. Ask your health care provider to suggest some techniques that may work well for you. These may include: Breathing exercises. Routines to release muscle tension. Visualizing peaceful scenes. Make sure that you drive carefully. Do not drive if you feel very sleepy. Keep all follow-up visits. This is important. Contact a health care provider if: You are tired throughout the day. You have trouble in your daily routine due to sleepiness. You continue to have sleep problems, or your sleep problems get worse. Get help right away if: You have thoughts about hurting yourself or someone else. Get help right away if you feel like you may hurt yourself or others, or have thoughts about taking your own life. Go to your nearest emergency room or: Call 911. Call the [facility] at 929-501-4200 or 988. This is open 24 hours a day. Text the Crisis Text Line at 206-152-4175. Summary Insomnia is a sleep disorder that makes it difficult to fall asleep or stay asleep. Insomnia can be long-term (chronic) or short-term (acute). Treatment for insomnia depends on the cause. Treatment may focus on treating an underlying condition that is causing the insomnia. Keep a sleep diary to help you and your health care provider figure out what could be causing your insomnia. This information is not intended to replace advice given to you by your health care provider. Make sure you discuss any questions you have with your health care provider. Document Revised: 04/10/2021 Document Reviewed: 04/10/2021 Elsevier Patient Education  [place] Sleep Information, Adult Quality sleep is important  for your mental and physical health. It also improves your quality of life. Quality sleep means you: Are asleep for most of the time you are in bed. Fall asleep within 30 minutes. Wake up no more than once a night. Are awake for no longer than 20 minutes if you do wake up during the night. Most adults need 7-8 hours of quality sleep each night. How can poor sleep affect me? If you do not get enough quality sleep, you may have: Mood swings. Daytime sleepiness. Decreased alertness, reaction time, and concentration. Sleep disorders, such as insomnia and sleep apnea. Difficulty with: Solving problems. Coping with stress. Paying attention. These issues may affect your performance and productivity at work, school, and home. Lack of sleep may also put you at higher risk for accidents, suicide, and risky behaviors. If you do not get quality sleep, you may also be at higher risk for several health problems, including: Infections. Type 2 diabetes. Heart disease. High blood pressure. Obesity. Worsening of long-term conditions, like arthritis, kidney disease, depression, Parkinson's disease, and epilepsy. What actions can I take to get more quality sleep? Sleep schedule and routine Stick to a sleep schedule. Go to sleep and wake up at about the same time each day. Do not try to sleep less on weekdays and make up for lost sleep on weekends. This does not work. Limit naps during the day to 30 minutes or less. Do not take naps in the late afternoon. Make time to relax before bed. Reading, listening to music, or taking a hot bath  promotes quality sleep. Make your bedroom a place that promotes quality sleep. Keep your bedroom dark, quiet, and at a comfortable room temperature. Make sure your bed is comfortable. Avoid using electronic devices that give off bright blue light for 30 minutes before bedtime. Your brain perceives bright blue light as sunlight. This includes television, phones, and  computers. If you are lying awake in bed for longer than 20 minutes, get up and do a relaxing activity until you feel sleepy. Lifestyle     Try to get at least 30 minutes of exercise on most days. Do not exercise 2-3 hours before going to bed. Do not use any products that contain nicotine or tobacco. These products include cigarettes, chewing tobacco, and vaping devices, such as e-cigarettes. If you need help quitting, ask your health care provider. Do not drink caffeinated beverages for at least 8 hours before going to bed. Coffee, tea, and some sodas contain caffeine. Do not drink alcohol or eat large meals close to bedtime. Try to get at least 30 minutes of sunlight every day. Morning sunlight is best. Medical concerns Work with your health care provider to treat medical conditions that may affect sleeping, such as: Nasal obstruction. Snoring. Sleep apnea and other sleep disorders. Talk to your health care provider if you think any of your prescription medicines may cause you to have difficulty falling or staying asleep. If you have sleep problems, talk with a sleep consultant. If you think you have a sleep disorder, talk with your health care provider about getting evaluated by a specialist. Where to find more information Sleep Foundation: sleepfoundation.org American Academy of Sleep Medicine: aasm.org Centers for Disease Control and Prevention (CDC): [employer].cy Contact a health care provider if: You have trouble getting to sleep or staying asleep. You often wake up very early in the morning and cannot get back to sleep. You have daytime sleepiness. You have daytime sleep attacks of suddenly falling asleep and sudden muscle weakness (narcolepsy). You have a tingling sensation in your legs with a strong urge to move your legs (restless legs syndrome). You stop breathing briefly during sleep (sleep apnea). You think you have a sleep disorder or are taking a medicine that is affecting  your quality of sleep. Summary Most adults need 7-8 hours of quality sleep each night. Getting enough quality sleep is important for your mental and physical health. Make your bedroom a place that promotes quality sleep, and avoid things that may cause you to have poor sleep, such as alcohol, caffeine, smoking, or large meals. Talk to your health care provider if you have trouble falling asleep or staying asleep. This information is not intended to replace advice given to you by your health care provider. Make sure you discuss any questions you have with your health care provider. Document Revised: 08/23/2021 Document Reviewed: 08/23/2021 Elsevier Patient Education  [place].

## 2022-01-25 NOTE — Progress Notes (Addendum)
SLEEP MEDICINE CLINIC    Provider:  Larey Seat, MD  Primary Care Physician:  Colon Branch, [address] [place] [address]     Referring Provider: Colon Branch, [address],  [place] [address]          Chief Complaint according to patient   Patient presents with:     New Patient (Initial Visit)           HISTORY OF PRESENT ILLNESS:  Emily Thompson is a 76 y.o. Caucasian female patient seen here as a referral on 01/25/2022 from Dr Larose Kells for a sleep evaluation.  Chief concern according to patient : 2023 May First, her long time partner died in his sleep- on her bed-  she currently sleeps five hours a night.  Mind is racing.  Nocturnal palpitations. Insomnia. Nocturia. She has GERD some nights.  She was told she snores in her sleep. Pt stated she is waking up at three AM before May event. If she can  go back to sleep in AM the sleep is of good quality. Dreams.    I have the pleasure of seeing Emily Thompson on [phone], female with a possible sleep disorder.  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  March 2003. (12/29/2010), HTN (hypertension) (03/14/2010), Hyperlipidemia (03/14/2010), Hypertension, Melanoma ([place]) (2012), Menopause (12/25/2010), Near syncope (10/26/2019), Osteopenia, OSTEOPENIA (03/14/2010), Palpitations (05/20/2013), PCP NOTES >>>>>>>>>>>>>>>>>>>>>>>>>>>>>>>>>>>>>  (07/03/2015), Personal history of colonic polyps (07/12/2020), PONV (postoperative nausea and vomiting), Post-menopausal atrophic vaginitis (12/25/2010), Postmenopausal bleeding (01/03/2015), SVT (supraventricular tachycardia) ([place]), and Thickened endometrium (01/05/2015)..     Sleep relevant medical history: Snoring some nights, Nocturia 2-3 times,  Tonsillectomy with adenoid ectomy- , cervical spine DDD, Low back DDD, deviated septum , nasal congestion.  Family medical /sleep history: late father was a loud snorer with OSA.   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 Widowed I 2000, had a BF for 18 years.  Has  2 adult children, 2 grandchildren.  Tobacco use: quit 1973.   ETOH use : no more than 2 a week, , Caffeine intake in form of Coffee( 1 cup in AM) Soda( 1 coke a week ) dark chocolate- daily- no energy drinks. Regular exercise in form of  walking .      Sleep habits are as follows: The patient's dinner time is between  6-6.30 PM. The patient goes to bed at 10-11 PM and continues to sleep for 5 hours, but fragmented sleep-wakes for several  bathroom breaks, the first time at 4-5 AM.   The preferred sleep position is laterally and supine , depending on shoulder pain and comfort- with the support of 1-2 pillows. Dreams are reportedly frequent/vivid.  7.00  AM is the usual rise time. The patient wakes up spontaneously at 5 AM ,sometimes with palpitations-  She reports not feeling refreshed or restored in AM, with symptoms such as dry mouth and residual fatigue.  Naps are taken infrequently, "  I don't like to nap". If a nap is taken its a power nap, 15-20 minutes and feels refreshed.     Review of Systems: Out of a complete 14 system review, the patient complains of only the following symptoms, and all other reviewed systems are negative.:  Fatigue, sleepiness , snoring, fragmented sleep, Insomnia    How likely are you to doze in the following  situations: 0 = not likely, 1 = slight chance, 2 = moderate chance, 3 = high chance   Sitting and Reading? Watching Television? Sitting inactive in a public place (theater or meeting)? As a passenger in a car for an hour without a break? Lying down in the afternoon when circumstances permit? Sitting and talking to someone? Sitting quietly after lunch without alcohol? In a car, while stopped for a few minutes in traffic?   Total = 7/ 24 points   FSS endorsed at 30/ 63 points.   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    Employer: RETIRED  Tobacco Use   Smoking status: Former    Years: 10.00    Types: Cigarettes    Quit date: 05/20/1973    Years since quitting: 48.7   Smokeless tobacco: Never  Vaping Use   Vaping Use: Never used  Substance and Sexual Activity   Alcohol use: Not Currently    Comment: Occas   Drug use: No   Sexual activity: Not Currently    Birth control/protection: Post-menopausal    Comment: 1st intercourse 76 yo-fewer than 5 partners  Other Topics Concern   Not on file  Social History Narrative   Widow, 1 adopted and 1 natural child; ([place], [place])   Live by herself   Lost her long-term companion Jenny Reichmann [place]  09/11/2021   Social Determinants of Health   Financial Resource Strain: Not on file  Food Insecurity: Not on file  Transportation Needs: Not on file  Physical Activity: Not on file  Stress: Not on file  Social Connections: Not on file    Family History  Problem Relation Age of Onset   Hypertension Mother    Thyroid disease Mother        M and B (?)   Hypertension Father    Stroke Father        hemorrhagic CVA at age 56   Bipolar disorder Brother    Diabetes Brother    Colon cancer Maternal Aunt        dx at  age 31s   Breast cancer Neg Hx     Past Medical History:  Diagnosis Date   Allergic rhinitis 08/13/2011   Annual physical exam 05/04/2011   BCC (basal cell carcinoma of skin) 2013, 2016   Breast cancer ([place]) 2003   chemo, radiation, lumpectomy, reconstructive surgery    Colon cancer screening 06/19/7122   Complication of anesthesia    Cough 05/27/2013   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   DJD (degenerative joint disease) 05/27/2013   Dyslipidemia    Endometrial cancer ([place])    Family history of malignant neoplasm of gastrointestinal tract 07/12/2020   Flatulence, eructation and gas pain 07/12/2020   GERD 03/14/2010   Qualifier: Diagnosis of  By: Larose Kells MD, Alda Berthold.  GERD (gastroesophageal reflux disease)    History of breast cancer, left.  March 2003. 12/29/2010   HTN (hypertension) 03/14/2010   Qualifier: Diagnosis of  By: Larose Kells MD, [place]    Hyperlipidemia 03/14/2010   Qualifier: Diagnosis of  By: Larose Kells MD, [place]    Hypertension    Melanoma ([place]) 2012   righ side face; basal cell (nose)    Menopause 12/25/2010   Near syncope 10/26/2019   Osteopenia    OSTEOPENIA 03/14/2010   Qualifier: Diagnosis of  By: Larose Kells MD, [place]    Palpitations 05/20/2013   PCP NOTES >>>>>>>>>>>>>>>>>>>>>>>>>>>>>>>>>>>>> 07/03/2015   Personal history of colonic polyps 07/12/2020   PONV (postoperative nausea and vomiting)    Post-menopausal atrophic vaginitis 12/25/2010   Postmenopausal bleeding 01/03/2015   SVT (supraventricular tachycardia) ([place])    used to see Dr Rex Kras    Thickened endometrium 01/05/2015    Past Surgical History:  Procedure Laterality Date   ABDOMINAL HYSTERECTOMY  2016   BASAL CELL CARCINOMA EXCISION     BREAST LUMPECTOMY Left 07/2001   BREAST RECONSTRUCTION  2005   CESAREAN SECTION  1977   COLONOSCOPY W/ POLYPECTOMY  2013   NEXT DUE IN 5 YRS   NM MYOCAR PERF WALL MOTION  01/2006   bruce myoview; no evidence of inducible ischemia, anterior wall thinning without evidence of ischemia;  post-stress EF 88%; low risk scan    NOSE SURGERY     basal cell carcinoma removed   R shoulder Arthroscopy and spur removal  02/15/20222   ROBOTIC ASSISTED TOTAL HYSTERECTOMY WITH BILATERAL SALPINGO OOPHERECTOMY Bilateral 01/27/2015   Procedure: ROBOTIC ASSISTED TOTAL HYSTERECTOMY WITH BILATERAL SALPINGO OOPHORECTOMY AND SENTINAL NODE BIOPSY;  Surgeon: Everitt Amber, MD;  Location: [facility];  Service: Gynecology;  Laterality: Bilateral;   SHOULDER SURGERY Right    SKIN CANCER EXCISION     melanoma   TONSILLECTOMY AND ADENOIDECTOMY  1952   TRANSTHORACIC ECHOCARDIOGRAM  12/2005   EF 50-55%; mild MR; trace TR   TUBAL LIGATION  1978   VULVA /PERINEUM BIOPSY     PAPILLOMA     Current Outpatient Medications on File Prior to Visit  Medication Sig Dispense Refill   CALCIUM-VITAMIN D-VITAMIN K PO Take 1,200 mg by mouth daily.     cholecalciferol (VITAMIN D) 1000 units tablet Take 1,000 Units by mouth daily.     ezetimibe (ZETIA) 10 MG tablet TAKE 1 TABLET BY MOUTH EVERY DAY (Patient taking differently: Take 10 mg by mouth daily.) 90 tablet 3   hydrochlorothiazide (HYDRODIURIL) 25 MG tablet Take 0.5 tablets (12.5 mg total) by mouth daily.     ibandronate (BONIVA) 150 MG tablet Take 1 tablet (150 mg total) by mouth every 30 (thirty) days. Take in am with a full glass of water,  do not  lie down for the next 30 min. 3 tablet 3   lansoprazole (PREVACID) 15 MG capsule Take 15 mg by mouth daily at 12 noon.     meloxicam (MOBIC) 15 MG tablet Take 1 tablet (15 mg total) by mouth daily as needed for pain. 90 tablet 0   metoprolol succinate (TOPROL-XL) 25 MG 24 hr tablet Take 1 tablet (25 mg total) by mouth daily. TAKE WITH OR IMMEDIATELY FOLLOWING A MEAL. 90 tablet 1   Multiple Vitamins-Minerals (PRESERVISION AREDS 2 PO) Take 1 capsule by mouth daily. Unknown strenght     nystatin cream (MYCOSTATIN) Apply 1 Application topically 2 (two) times daily. 30 g 0   rosuvastatin (CRESTOR)  5 MG tablet TAKE 1 TABLET BY  MOUTH 4 TIMES WEEKLY (Patient taking differently: Take 5 mg by mouth 4 (four) times a week.) 48 tablet 3   telmisartan (MICARDIS) 40 MG tablet TAKE 1 TABLET BY MOUTH EVERY DAY (Patient taking differently: Take 40 mg by mouth daily.) 90 tablet 1   TIADYLT ER 240 MG 24 hr capsule TAKE 1 CAPSULE BY MOUTH EVERY DAY (Patient taking differently: Take by mouth daily.) 90 capsule 1   No current facility-administered medications on file prior to visit.    Allergies  Allergen Reactions   Dextromethorphan     Per pt, increases BP   Hydrocodone     Wires her up SEVERE NAUSEA   Statins     Headache, hot flashes   Morphine And Related     Could not move SEVERE NAUSEA   Tape Rash    Physical exam:  Today's Vitals   01/25/22 0950  BP: (!) 141/74  Pulse: 64  Weight: 141 lb 12.8 oz (64.3 kg)  Height: 5' (1.524 m)   Body mass index is 27.69 kg/m.   Wt Readings from Last 3 Encounters:  01/25/22 141 lb 12.8 oz (64.3 kg)  01/16/22 146 lb (66.2 kg)  12/13/21 144 lb (65.3 kg)     Ht Readings from Last 3 Encounters:  01/25/22 5' (1.524 m)  01/16/22 4' 11.5" (1.511 m)  12/13/21 5' (1.524 m)      General: The patient is awake, alert and appears not in acute distress. The patient is well groomed. Head: Normocephalic, atraumatic. Neck is supple. Mallampati 2-3,  neck circumference:13. 5 inches . Nasal airflow congested.    Dental status: irregular  Cardiovascular:  Regular rate and cardiac rhythm by pulse,  without distended neck veins. Respiratory: Lungs are clear to auscultation.  Skin: ankle edema, no rash. Trunk: The patient's posture is erect.   Neurologic exam : The patient is awake and alert, oriented to place and time.   Memory subjective described as intact.  Attention span & concentration ability appears normal.  Speech is fluent,  without  dysarthria, dysphonia or aphasia.  Mood and affect are appropriate.   Cranial nerves: no loss of smell or taste reported  Pupils are  equal and briskly reactive to light. Funduscopic exam deferred.  Extraocular movements in vertical and horizontal planes were intact . No Diplopia. Visual fields by finger perimetry are intact. Hearing was impaired to soft voice a.    Facial sensation intact to fine touch.  Facial motor strength is symmetric and tongue and uvula move midline.  Neck ROM : rotation, tilt and flexion extension were normal for age and shoulder shrug was symmetrical.    Motor exam:  Symmetric bulk, tone and ROM.   Normal tone without cog wheeling, symmetric grip strength .   Sensory:  Fine touch and vibration were tested : left foot numb at ankle level.  Proprioception tested in the upper extremities was normal.   Coordination: Rapid alternating movements in the fingers/hands were of normal speed.  The Finger-to-nose maneuver was intact without evidence of ataxia, dysmetria or tremor.   Gait and station: Patient could rise unassisted from a seated position, walked without assistive device. Right hip pain, has been watching her steps.  Stance is of normal width/ base.  Toe and heel walk were deferred.  Deep tendon reflexes: in the upper and lower extremities are symmetric and intact.  Babinski response was deferred.       After spending a  total time of 45 minutes face to face and additional time for physical and neurologic examination, review of laboratory studies,  personal review of imaging studies, reports and results of other testing and review of referral information / records as far as provided in visit, I have established the following assessments:  1)  GRIEF situational insomnia- recent loss of a long time life companion. CBT or bio-feed back helps often. She likes Tai-Chi 2) snoring is situational , may be positional - supine.  3)  sleep position is dictated by  shoulder pain and breast implant.( Status post Breast cancer ) . 4) ZIO patch was positive for  PVCs- no atrial fibrillation. Metoprolol  has helped palpitation.    My Plan is to proceed with:  1) HST or PSG to screen for apnea.  2) encourage hearing aids.  3) encourage volunteering, membership in clubs.   I would like to thank Colon Branch, MD and Colon Branch, [address],  [place] [address] for allowing me to meet with and to take care of this pleasant patient.   In short, Emily Thompson is presenting with grief -  I plan to follow up either personally or through our NP  only if the HST is positive for apnea.   CC: I will share my notes with PCP.  Electronically signed by: Larey Seat, MD 01/25/2022 10:28 AM  [facility] Board certified by The [employer] of Sleep Medicine and Diplomate of the [employer] of Sleep Medicine. Board certified In Neurology through the [place], Fellow of the [employer] of Neurology. Medical Director of [employer].

## 2022-01-29 ENCOUNTER — Telehealth: Payer: Self-pay | Admitting: Neurology

## 2022-01-29 NOTE — Telephone Encounter (Signed)
HTA pending faxed notes 

## 2022-01-31 ENCOUNTER — Ambulatory Visit: Payer: PPO

## 2022-01-31 ENCOUNTER — Ambulatory Visit: Payer: PPO | Admitting: Internal Medicine

## 2022-02-01 ENCOUNTER — Ambulatory Visit (INDEPENDENT_AMBULATORY_CARE_PROVIDER_SITE_OTHER): Payer: PPO | Admitting: Internal Medicine

## 2022-02-01 ENCOUNTER — Encounter: Payer: Self-pay | Admitting: Internal Medicine

## 2022-02-01 VITALS — BP 118/68 | HR 69 | Temp 97.9°F | Resp 18 | Ht 60.0 in | Wt 147.5 lb

## 2022-02-01 DIAGNOSIS — I471 Supraventricular tachycardia: Secondary | ICD-10-CM

## 2022-02-01 DIAGNOSIS — R55 Syncope and collapse: Secondary | ICD-10-CM | POA: Diagnosis not present

## 2022-02-01 DIAGNOSIS — R600 Localized edema: Secondary | ICD-10-CM | POA: Diagnosis not present

## 2022-02-01 NOTE — Progress Notes (Signed)
Subjective:    Patient ID: Emily Thompson, female    DOB: 1945/10/19, 76 y.o.   MRN: [id number]  DOS:  02/01/2022 Type of visit - description: Follow-up  At the last visit, she was seen with near syncope grieving. Saw cardiology and neurology, notes reviewed. Overall she is much improved. No further near syncope.  Emotionally better. She did complain of lower extremity edema, around the ankles, mostly late in the day.  She also has noticed some nocturia without dysuria or gross hematuria. Denies shortness of breath, orthopnea or DOE.  Wt Readings from Last 3 Encounters:  02/01/22 147 lb 8 oz (66.9 kg)  01/25/22 141 lb 12.8 oz (64.3 kg)  01/16/22 146 lb (66.2 kg)     Review of Systems See above   Past Medical History:  Diagnosis Date   Allergic rhinitis 08/13/2011   Annual physical exam 05/04/2011   BCC (basal cell carcinoma of skin) 2013, 2016   Breast cancer ([place]) 2003   chemo, radiation, lumpectomy, reconstructive surgery    Colon cancer screening 10/15/158   Complication of anesthesia    Cough 05/27/2013   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   DJD (degenerative joint disease) 05/27/2013   Dyslipidemia    Endometrial cancer ([place])    Family history of malignant neoplasm of gastrointestinal tract 07/12/2020   Flatulence, eructation and gas pain 07/12/2020   GERD 03/14/2010   Qualifier: Diagnosis of  By: Larose Kells MD, Alda Berthold.     GERD (gastroesophageal reflux disease)    History of breast cancer, left.  March 2003. 12/29/2010   HTN (hypertension) 03/14/2010   Qualifier: Diagnosis of  By: Larose Kells MD, [place]    Hyperlipidemia 03/14/2010   Qualifier: Diagnosis of  By: Larose Kells MD, [place]    Hypertension    Melanoma ([place]) 2012   righ side face; basal cell (nose)    Menopause 12/25/2010   Near syncope 10/26/2019   Osteopenia    OSTEOPENIA 03/14/2010   Qualifier: Diagnosis of  By: Larose Kells MD, [place]    Palpitations 05/20/2013   PCP NOTES >>>>>>>>>>>>>>>>>>>>>>>>>>>>>>>>>>>>> 07/03/2015    Personal history of colonic polyps 07/12/2020   PONV (postoperative nausea and vomiting)    Post-menopausal atrophic vaginitis 12/25/2010   Postmenopausal bleeding 01/03/2015   SVT (supraventricular tachycardia) ([place])    used to see Dr Rex Kras    Thickened endometrium 01/05/2015    Past Surgical History:  Procedure Laterality Date   ABDOMINAL HYSTERECTOMY  2016   BASAL CELL CARCINOMA EXCISION     BREAST LUMPECTOMY Left 07/2001   BREAST RECONSTRUCTION  2005   CESAREAN SECTION  1977   COLONOSCOPY W/ POLYPECTOMY  2013   NEXT DUE IN 5 YRS   NM MYOCAR PERF WALL MOTION  01/2006   bruce myoview; no evidence of inducible ischemia, anterior wall thinning without evidence of ischemia; post-stress EF 88%; low risk scan    NOSE SURGERY     basal cell carcinoma removed   R shoulder Arthroscopy and spur removal  02/15/20222   ROBOTIC ASSISTED TOTAL HYSTERECTOMY WITH BILATERAL SALPINGO OOPHERECTOMY Bilateral 01/27/2015   Procedure: ROBOTIC ASSISTED TOTAL HYSTERECTOMY WITH BILATERAL SALPINGO OOPHORECTOMY AND SENTINAL NODE BIOPSY;  Surgeon: Everitt Amber, MD;  Location: [facility];  Service: Gynecology;  Laterality: Bilateral;   SHOULDER SURGERY Right    SKIN CANCER EXCISION     melanoma   TONSILLECTOMY AND ADENOIDECTOMY  1952   TRANSTHORACIC ECHOCARDIOGRAM  12/2005   EF 50-55%; mild MR; trace TR  TUBAL LIGATION  1978   VULVA /PERINEUM BIOPSY     PAPILLOMA    Current Outpatient Medications  Medication Instructions   CALCIUM-VITAMIN D-VITAMIN K PO 1,200 mg, Oral, Daily   cholecalciferol (VITAMIN D) 1,000 Units, Oral, Daily   ezetimibe (ZETIA) 10 MG tablet TAKE 1 TABLET BY MOUTH EVERY DAY   hydrochlorothiazide (HYDRODIURIL) 12.5 mg, Oral, Daily   ibandronate (BONIVA) 150 mg, Oral, Every 30 days, Take in am with a full glass of water,  do not  lie down for the next 30 min.   lansoprazole (PREVACID) 15 mg, Oral, Daily   meloxicam (MOBIC) 15 mg, Oral, Daily PRN   metoprolol succinate (TOPROL-XL) 25 mg,  Oral, Daily, TAKE WITH OR IMMEDIATELY FOLLOWING A MEAL.   Multiple Vitamins-Minerals (PRESERVISION AREDS 2 PO) 1 capsule, Oral, Daily, Unknown strenght   nystatin cream (MYCOSTATIN) 1 Application, Topical, 2 times daily   rosuvastatin (CRESTOR) 5 MG tablet TAKE 1 TABLET BY MOUTH 4 TIMES WEEKLY   telmisartan (MICARDIS) 40 MG tablet TAKE 1 TABLET BY MOUTH EVERY DAY   TIADYLT ER 240 MG 24 hr capsule TAKE 1 CAPSULE BY MOUTH EVERY DAY       Objective:   Physical Exam BP 118/68   Pulse 69   Temp 97.9 F (36.6 C) (Oral)   Resp 18   Ht 5' (1.524 m)   Wt 147 lb 8 oz (66.9 kg)   LMP 05/14/1997   SpO2 97%   BMI 28.81 kg/m  General:   Well developed, NAD, BMI noted. HEENT:  Normocephalic . Face symmetric, atraumatic Lungs:  CTA B Normal respiratory effort, no intercostal retractions, no accessory muscle use. Heart: RRR,  no murmur.  Lower extremities: no pretibial edema bilaterally  Skin: Not pale. Not jaundice Neurologic:  alert & oriented X3.  Speech normal, gait appropriate for age and unassisted Psych--  Cognition and judgment appear intact.  Cooperative with normal attention span and concentration.  Behavior appropriate. No anxious or depressed appearing.      Assessment     Assessment HTN Hyperlipidemia   GERD Osteopenia --dexa 2016 (per gyn); rx boniva 05-2017 @ gyn Insomnia  MSK: ---DJD,  NSAIDs prn --Back pain, --Dr Tommie Raymond ~ 2011, saw the spine center ~ 2015 , dx w/  spinal stenosis (no MRI, clinical dx ) --2017-2018: PT, injections Dr Catha Brow Cough-- hycodan prn (tolerates well small doses hydrocodone) Oncology: --Breast cancer 2003 --Endometrial cancer, H-BSO 01/2015, will see hematology x 5 years (gyn-onc) --Melanoma, BCC (Dr Delman Cheadle) Lung nodule, 76m, per CTs, last CT 09/30/2015 : stable  H/o SVT  HOH   PLAN: Near syncope. Holter monitor show SVTs, saw cardiology 01/16/2022, they rec to continue metoprolol which was helping; also continue diltiazem.   Symptoms have not resurface. 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  Recently metoprolol was added due to results of Holter monitor showing frequent potentially symptomatic PVCs.  Metoprolol has helped significantly. Lower extremity edema. Today reports LE edema bilaterally at the end of the day, could be related to CCB's. Options: Adjust CCB dose, compression stockings, leg elevation/low-salt diet. She agreed to leg elevation, low-salt diets and will try compression stockings. OSA? Saw neurology 01/25/2022. They recommended HST or PSG.  Vaccine advice provided, see AVS. RTC as scheduled for October 2024.

## 2022-02-01 NOTE — Patient Instructions (Addendum)
Vaccines I recommend:  Covid booster RSV vaccine Flu shot- high dose    See you in October

## 2022-02-02 MED ORDER — HYDROCHLOROTHIAZIDE 12.5 MG PO TABS: 12.5000 mg | ORAL_TABLET | Freq: Every day | ORAL | 1 refills | Status: DC

## 2022-02-02 NOTE — Telephone Encounter (Signed)
Please do

## 2022-02-02 NOTE — Addendum Note (Signed)
Addended byDamita Dunnings D on: 02/02/2022 10:33 AM   Modules accepted: Orders

## 2022-02-02 NOTE — Assessment & Plan Note (Signed)
Near syncope. Holter monitor show SVTs, saw cardiology 01/16/2022, they rec to continue metoprolol which was helping; also continue diltiazem.  Symptoms have not resurface. 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  Recently metoprolol was added due to results of Holter monitor showing frequent potentially symptomatic PVCs.  Metoprolol has helped significantly. Lower extremity edema. Today reports LE edema bilaterally at the end of the day, could be related to CCB's. Options: Adjust CCB dose, compression stockings, leg elevation/low-salt diet. She agreed to leg elevation, low-salt diets and will try compression stockings. OSA? Saw neurology 01/25/2022. They recommended HST or PSG.  Vaccine advice provided, see AVS. RTC as scheduled for October 2024.

## 2022-02-09 ENCOUNTER — Ambulatory Visit (INDEPENDENT_AMBULATORY_CARE_PROVIDER_SITE_OTHER): Payer: PPO | Admitting: Obstetrics and Gynecology

## 2022-02-09 ENCOUNTER — Encounter: Payer: Self-pay | Admitting: Obstetrics and Gynecology

## 2022-02-09 VITALS — BP 132/80 | Wt 148.0 lb

## 2022-02-09 DIAGNOSIS — B372 Candidiasis of skin and nail: Secondary | ICD-10-CM | POA: Diagnosis not present

## 2022-02-09 NOTE — Patient Instructions (Signed)
Skin Yeast Infection  A skin yeast infection is a condition in which there is an overgrowth of yeast (Candida) that normally lives on the skin. This condition usually occurs in areas of the skin that are constantly warm and moist, such as the skin under the breasts or armpits, or in the groin and other body folds. What are the causes? This condition is caused by a change in the normal balance of the yeast that live on the skin. What increases the risk? You are more likely to develop this condition if you: Are obese. Are pregnant. Are 78 years of age or older. Wear tight clothing. Have any of the following conditions: Diabetes. Malnutrition. A weak body defense system (immune system). Take medicines such as: Birth control pills. Antibiotics. Steroid medicines. What are the signs or symptoms? The most common symptom of this condition is itchiness in the affected area. Other symptoms include: A red, swollen area of the skin. Bumps on the skin. How is this diagnosed? This condition is diagnosed with a medical history and physical exam. Your health care provider may check for yeast by taking scrapings of the skin to be viewed under a microscope. How is this treated? This condition is treated with medicine. Medicines may be prescribed or available over the counter. The medicines may be: Taken by mouth (orally). Applied as a cream or powder to your skin. Follow these instructions at home:  Take or apply over-the-counter and prescription medicines only as told by your health care provider. Maintain a healthy weight. If you need help losing weight, talk with your health care provider. Keep your skin clean and dry. Wear loose-fitting clothing. If you have diabetes, keep your blood sugar under control. Keep all follow-up visits. This is important. Contact a health care provider if: Your symptoms go away and then come back. Your symptoms do not get better with treatment. Your symptoms get  worse. Your rash spreads. You have a fever or chills. You have new symptoms. You have new warmth or redness of your skin. Your rash is painful or bleeding. Summary A skin yeast infection is a condition in which there is an overgrowth of yeast (Candida) that normally lives on the skin. Take or apply over-the-counter and prescription medicines only as told by your health care provider. Keep your skin clean and dry. Contact a health care provider if your symptoms do not get better with treatment. This information is not intended to replace advice given to you by your health care provider. Make sure you discuss any questions you have with your health care provider. Document Revised: 07/19/2020 Document Reviewed: 07/19/2020 Elsevier Patient Education  2023 Elsevier Inc.  

## 2022-02-09 NOTE — Progress Notes (Signed)
GYNECOLOGY  VISIT   HPI: 76 y.o.   Widowed White or Caucasian Not Hispanic or Latino  female   [id number] with Patient's last menstrual period was 05/14/1997.   here for   Complains of rash in crease of legs x's 5 days, otc antibiotic cream, hydrocortisone, helps some. It is slightly sore, no itchy. No vulvar irritation, no abnormal d/c. She has been using some nystatin, bacitracin and hydrocortisone 2 x a day for 4-5 days. She has been cleaning 2 x a day. She is 50% better.    GYNECOLOGIC HISTORY: Patient's last menstrual period was 05/14/1997. Contraception: hysterectomy Menopausal hormone therapy: none.        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   Family history of malignant neoplasm of gastrointestinal tract 07/12/2020   Colon cancer screening 07/12/2020   Flatulence, eructation and gas pain 07/12/2020   SVT (supraventricular tachycardia) (HCC)    PONV (postoperative nausea and vomiting)    Osteopenia    Hypertension    GERD (gastroesophageal reflux disease)    Dyslipidemia    Complication of anesthesia    BCC (basal cell carcinoma of skin)    Near syncope 10/26/2019   PCP NOTES >>>>>>>>>>>>>>>>>>>>>>>>>>>>>>>>>>>>> 07/03/2015   Endometrial cancer ([place]) 01/27/2015   Thickened endometrium 01/05/2015   Postmenopausal bleeding 01/03/2015   DJD (degenerative joint disease) 05/27/2013   Cough 05/27/2013   Palpitations 05/20/2013   Melanoma ([place])    Allergic rhinitis 08/13/2011   Annual physical exam 05/04/2011   History of breast cancer, left.  March 2003. 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     GERD (gastroesophageal reflux disease)    History of breast cancer, left.  March 2003. 12/29/2010   HTN (hypertension) 03/14/2010   Qualifier: Diagnosis of  By: Larose Kells MD, [place]    Hyperlipidemia 03/14/2010   Qualifier: Diagnosis of  By: Larose Kells MD, [place]    Hypertension    Melanoma ([place]) 2012   righ side face; basal cell (nose)    Menopause 12/25/2010   Near syncope 10/26/2019   Osteopenia    OSTEOPENIA 03/14/2010  Qualifier: Diagnosis of  By: Larose Kells MD, [place]    Palpitations 05/20/2013   PCP NOTES >>>>>>>>>>>>>>>>>>>>>>>>>>>>>>>>>>>>> 07/03/2015   Personal history of colonic polyps 07/12/2020   PONV (postoperative nausea and vomiting)    Post-menopausal atrophic vaginitis 12/25/2010   Postmenopausal bleeding 01/03/2015   SVT (supraventricular tachycardia) ([place])    used to see Dr Rex Kras    Thickened endometrium 01/05/2015    Past Surgical History:  Procedure Laterality Date   ABDOMINAL HYSTERECTOMY  2016   BASAL CELL CARCINOMA EXCISION     BREAST LUMPECTOMY Left 07/2001   BREAST RECONSTRUCTION  2005   CESAREAN SECTION  1977   COLONOSCOPY W/ POLYPECTOMY  2013   NEXT DUE IN 5 YRS   NM MYOCAR PERF WALL MOTION  01/2006   bruce myoview; no evidence of inducible ischemia, anterior wall thinning without evidence of ischemia;  post-stress EF 88%; low risk scan    NOSE SURGERY     basal cell carcinoma removed   R shoulder Arthroscopy and spur removal  02/15/20222   ROBOTIC ASSISTED TOTAL HYSTERECTOMY WITH BILATERAL SALPINGO OOPHERECTOMY Bilateral 01/27/2015   Procedure: ROBOTIC ASSISTED TOTAL HYSTERECTOMY WITH BILATERAL SALPINGO OOPHORECTOMY AND SENTINAL NODE BIOPSY;  Surgeon: Everitt Amber, MD;  Location: [facility];  Service: Gynecology;  Laterality: Bilateral;   SHOULDER SURGERY Right    SKIN CANCER EXCISION     melanoma   TONSILLECTOMY AND ADENOIDECTOMY  1952   TRANSTHORACIC ECHOCARDIOGRAM  12/2005   EF 50-55%; mild MR; trace TR   TUBAL LIGATION  1978   VULVA /PERINEUM BIOPSY     PAPILLOMA    Current Outpatient Medications  Medication Sig Dispense Refill   CALCIUM-VITAMIN D-VITAMIN K PO Take 1,200 mg by mouth daily.     cholecalciferol (VITAMIN D) 1000 units tablet Take 1,000 Units by mouth daily.     ezetimibe (ZETIA) 10 MG tablet TAKE 1 TABLET BY MOUTH EVERY DAY (Patient taking differently: Take 10 mg by mouth daily.) 90 tablet 3   hydrochlorothiazide (HYDRODIURIL) 12.5 MG tablet Take 1 tablet (12.5 mg total) by mouth daily. 90 tablet 1   ibandronate (BONIVA) 150 MG tablet Take 1 tablet (150 mg total) by mouth every 30 (thirty) days. Take in am with a full glass of water,  do not  lie down for the next 30 min. 3 tablet 3   lansoprazole (PREVACID) 15 MG capsule Take 15 mg by mouth daily at 12 noon.     meloxicam (MOBIC) 15 MG tablet Take 1 tablet (15 mg total) by mouth daily as needed for pain. 90 tablet 0   metoprolol succinate (TOPROL-XL) 25 MG 24 hr tablet Take 1 tablet (25 mg total) by mouth daily. TAKE WITH OR IMMEDIATELY FOLLOWING A MEAL. 90 tablet 1   Multiple Vitamins-Minerals (PRESERVISION AREDS 2 PO) Take 1 capsule by mouth daily. Unknown strenght     nystatin cream (MYCOSTATIN) Apply 1 Application topically 2 (two) times daily. 30 g 0   rosuvastatin (CRESTOR) 5 MG tablet TAKE 1 TABLET BY MOUTH 4 TIMES  WEEKLY (Patient taking differently: Take 5 mg by mouth 4 (four) times a week.) 48 tablet 3   telmisartan (MICARDIS) 40 MG tablet TAKE 1 TABLET BY MOUTH EVERY DAY (Patient taking differently: Take 40 mg by mouth daily.) 90 tablet 1   TIADYLT ER 240 MG 24 hr capsule TAKE 1 CAPSULE BY MOUTH EVERY DAY (Patient taking differently: Take by mouth daily.) 90 capsule 1   hydrocortisone 2.5 % cream Apply topically.  No current facility-administered medications for this visit.     ALLERGIES: Dextromethorphan, Hydrocodone, Statins, Morphine and related, and Tape  Family History  Problem Relation Age of Onset   Hypertension Mother    Thyroid disease Mother        M and B (?)   Hypertension Father    Stroke Father        hemorrhagic CVA at age 55   Bipolar disorder Brother    Diabetes Brother    Colon cancer Maternal Aunt        dx at age 55s   Breast cancer Neg Hx     Social History   Socioeconomic History   Marital status: Widowed    Spouse name: Not on file   Number of children: 2   Years of education: BSN   Highest education level: Not on file  Occupational History   Occupation: Marine scientist, 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)    Employer: RETIRED  Tobacco Use   Smoking status: Former    Years: 10.00    Types: Cigarettes    Quit date: 05/20/1973    Years since quitting: 48.7   Smokeless tobacco: Never  Vaping Use   Vaping Use: Never used  Substance and Sexual Activity   Alcohol use: Not Currently    Comment: Occas   Drug use: No   Sexual activity: Not Currently    Birth control/protection: Post-menopausal    Comment: 1st intercourse 76 yo-fewer than 5 partners  Other Topics Concern   Not on file  Social History Narrative   Widow, 1 adopted and 1 natural child; ([place], [place])   Live by herself   Lost her long-term companion Jenny Reichmann [place]  09/11/2021   Social Determinants of Health   Financial Resource Strain: Not on file  Food Insecurity: Not on file  Transportation Needs:  Not on file  Physical Activity: Not on file  Stress: Not on file  Social Connections: Not on file  Intimate Partner Violence: Not on file    Review of Systems  All other systems reviewed and are negative.   PHYSICAL EXAMINATION:    BP 132/80 (BP Location: Left Arm, Patient Position: Sitting, Cuff Size: Normal)   Wt 148 lb (67.1 kg)   LMP 05/14/1997   BMI 28.90 kg/m     General appearance: alert, cooperative and appears stated age Skin: she has a patchy, erythematous rash in bilaterally groin, c/w candida intertrigo.   1. Candidal intertrigo She will continue to use nystatin cream (has it) Discussed keeping the area clean and dry Avoid underwear that rubs in the area of the rash Avoid underwear overnight Call if not improving

## 2022-02-19 ENCOUNTER — Encounter: Payer: Self-pay | Admitting: Internal Medicine

## 2022-02-19 ENCOUNTER — Other Ambulatory Visit: Payer: Self-pay | Admitting: Internal Medicine

## 2022-02-27 ENCOUNTER — Encounter: Payer: Self-pay | Admitting: Neurology

## 2022-03-05 NOTE — Telephone Encounter (Signed)
NPSG- HTA auth: [id number] (exp. 01/29/22 to 04/29/22).  Patient is scheduled at [facility] for 04/08/22 at 9 pm.  Mailed packet to the patient.

## 2022-03-07 NOTE — Telephone Encounter (Signed)
Patient is scheduled NPSG at 04/08/22.

## 2022-03-08 ENCOUNTER — Encounter: Payer: Self-pay | Admitting: Nurse Practitioner

## 2022-03-13 ENCOUNTER — Encounter: Payer: PPO | Admitting: Internal Medicine

## 2022-03-14 ENCOUNTER — Encounter: Payer: Self-pay | Admitting: Neurology

## 2022-03-15 NOTE — Telephone Encounter (Signed)
Patient does not want to do the NPSG.  Her HST is scheduled for 04/03/22 at 11:30 AM.  Mailed a new packet to the patient.  And also pending with HTA for the auth for HST.

## 2022-03-15 NOTE — Telephone Encounter (Signed)
I spoke with the patient her NPSG is canceled and the HST is scheduled for 04/03/22 at 11:30 am.

## 2022-03-19 ENCOUNTER — Ambulatory Visit: Payer: PPO | Admitting: Nurse Practitioner

## 2022-03-19 ENCOUNTER — Encounter: Payer: Self-pay | Admitting: Nurse Practitioner

## 2022-03-19 VITALS — BP 132/78 | HR 66 | Ht 58.75 in | Wt 149.0 lb

## 2022-03-19 DIAGNOSIS — M810 Age-related osteoporosis without current pathological fracture: Secondary | ICD-10-CM

## 2022-03-19 MED ORDER — IBANDRONATE SODIUM 150 MG PO TABS: 150.0000 mg | ORAL_TABLET | ORAL | 3 refills | Status: DC

## 2022-03-19 NOTE — Telephone Encounter (Signed)
HST- HTA Josem Kaufmann: [id number] (exp. 03/15/22 to 06/13/22)

## 2022-03-19 NOTE — Progress Notes (Signed)
   Acute Office Visit  Subjective:    Patient ID: Emily Thompson, female    DOB: 24-Jul-1945, 76 y.o.   MRN: [id number]   HPI 76 y.o. presents today for medication management. On Boniva for osteoporosis management. Was on Boniva x 5 years in the past while taking Femara and then restarted in 2018. Tolerating well. T-score -1.8 in February 2023. She fell recently on the sidewalk as it was getting dark and tripped. She had some scratches and bruising but no break.    Review of Systems  Constitutional: Negative.        Objective:    Physical Exam Constitutional:      Appearance: Normal appearance.     BP 132/78   Pulse 66   Ht 4' 10.75" (1.492 m)   Wt 149 lb (67.6 kg)   LMP 05/14/1997   SpO2 99%   BMI 30.35 kg/m  Wt Readings from Last 3 Encounters:  03/19/22 149 lb (67.6 kg)  02/09/22 148 lb (67.1 kg)  02/01/22 147 lb 8 oz (66.9 kg)        Assessment & Plan:   Problem List Items Addressed This Visit   None Visit Diagnoses     Age-related osteoporosis without current pathological fracture    -  Primary   Relevant Medications   ibandronate (BONIVA) 150 MG tablet      Plan: Continue Boniva. Discussed taking Holiday or switching to Prolia or Reclast after next DXA in 2024. She is nervous to do the long-acting medications. Fall prevention measures and home safety reviewed. Continue Vitamin D + calcium and regular exercise.       Tamela Gammon DNP, 10:19 AM 03/19/2022

## 2022-03-27 ENCOUNTER — Other Ambulatory Visit: Payer: Self-pay | Admitting: Internal Medicine

## 2022-03-30 DIAGNOSIS — M5416 Radiculopathy, lumbar region: Secondary | ICD-10-CM | POA: Diagnosis not present

## 2022-04-03 ENCOUNTER — Ambulatory Visit (INDEPENDENT_AMBULATORY_CARE_PROVIDER_SITE_OTHER): Payer: PPO | Admitting: Neurology

## 2022-04-03 DIAGNOSIS — F5104 Psychophysiologic insomnia: Secondary | ICD-10-CM

## 2022-04-03 DIAGNOSIS — R0683 Snoring: Secondary | ICD-10-CM

## 2022-04-03 DIAGNOSIS — G4733 Obstructive sleep apnea (adult) (pediatric): Secondary | ICD-10-CM

## 2022-04-03 DIAGNOSIS — F4321 Adjustment disorder with depressed mood: Secondary | ICD-10-CM

## 2022-04-04 NOTE — Progress Notes (Signed)
[facility]. [address] [place], 76 y.o., July 26, 1945 MRN: [id number]    HOME SLEEP TEST REPORT ( by Watch PAT)   STUDY DATA loaded today, 04-09-2022     ORDERING CLINICIAN: Larey Seat, MD  REFERRING CLINICIAN: Dr Larose Kells, MD    CLINICAL INFORMATION/HISTORY: 01-25-2022:  Emily Thompson is a 76 y.o. Caucasian female patient seen here upon referral on 01/25/2022 from Dr Larose Kells for a sleep evaluation.  Chief concern according to patient : on Sep 26, 2021 , her long time partner died in his sleep- in her bed- and she found him. His death has been upsetting, also lonesome,  she currently sleeps five hours a night.  Mind is racing.  Nocturnal palpitations. Insomnia. Nocturia. She has GERD some nights.  She was told she snores in her sleep. Pt stated she is waking up frequently at three AM -and if she can go back to sleep in AM the sleep is of good quality. Dreams frequently.      Epworth sleepiness score: 7 /24.   BMI: 27.7 kg/m   Neck Circumference: 14"   FINDINGS:   Sleep Summary:   Total Recording Time (hours, min): 8 hours 6 minutes      Total Sleep Time (hours, min):   7 hours 12 minutes              Percent REM (%):   26%       Sleep latency was 20 minutes, REM sleep latency 94 minutes.                                Respiratory Indices:   Calculated pAHI (per hour):    31.6/h                         REM pAHI: 45.2/h                                               NREM pAHI: 27/h                             Positional sleep AHI:   There were no data from the chest wall electrode available -neither to snoring nor to sleep position.                                                Oxygen Saturation Statistics:   O2 Saturation Range (%):    Between a nadir of 72 and a maximum of 98 %                                  O2 Saturation (minutes) <89%: 28 minutes  O2 saturation in minutes under 90%: 53 minutes, or 12.2% of total sleep time          Pulse Rate Statistics:   Pulse Mean (bpm):   59 bpm              Pulse Range:    Between 47 and 95 bpm.  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   RECOMMENDATION: Severe sleep apnea that is REM sleep accentuated and hypoxia that is REM sleep dependent should be treated with CPAP for positive airway pressure therapy.    I recommend an autotitration CPAP device by [employer] or [employer], a setting between 6 and 15 cm water pressure, with 2 cm expiratory relief, heated humidification, and an interface of patient's choice that should be fitted in person.    INTERPRETING PHYSICIAN:   Emily Seat, MD   Medical Director of [facility] at [facility]

## 2022-04-06 DIAGNOSIS — M545 Low back pain, unspecified: Secondary | ICD-10-CM | POA: Diagnosis not present

## 2022-04-09 DIAGNOSIS — G4733 Obstructive sleep apnea (adult) (pediatric): Secondary | ICD-10-CM | POA: Insufficient documentation

## 2022-04-09 DIAGNOSIS — F5104 Psychophysiologic insomnia: Secondary | ICD-10-CM | POA: Insufficient documentation

## 2022-04-09 NOTE — Procedures (Signed)
[facility]. [address]" Female, 76 y.o., 05-24-1945 MRN: [id number]    HOME SLEEP TEST REPORT ( by Watch PAT)   STUDY DATA loaded today, 04-09-2022     ORDERING CLINICIAN: Larey Seat, MD  REFERRING CLINICIAN: Dr Larose Kells, MD    CLINICAL INFORMATION/HISTORY: 01-25-2022:  Emily Thompson is a 76 y.o. Caucasian female patient seen here upon referral on 01/25/2022 from Dr Larose Kells for a sleep evaluation.  Chief concern according to patient : on Oct 03, 2021 , her long time partner died in his sleep- in her bed- and she found him. His death has been upsetting, also lonesome,  she currently sleeps five hours a night.  Mind is racing.  Nocturnal palpitations. Insomnia. Nocturia. She has GERD some nights.  She was told she snores in her sleep. Pt stated she is waking up frequently at three AM -and if she can go back to sleep in AM the sleep is of good quality. Dreams frequently.      Epworth sleepiness score: 7 /24.   BMI: 27.7 kg/m   Neck Circumference: 14"   FINDINGS:   Sleep Summary:   Total Recording Time (hours, min): 8 hours 6 minutes      Total Sleep Time (hours, min):   7 hours 12 minutes              Percent REM (%):   26%       Sleep latency was 20 minutes, REM sleep latency 94 minutes.                                Respiratory Indices:   Calculated pAHI (per hour):    31.6/h                         REM pAHI: 45.2/h                                               NREM pAHI: 27/h                             Positional sleep AHI:   There were no data from the chest wall electrode available -neither to snoring nor to sleep position.                                                Oxygen Saturation Statistics:   O2 Saturation Range (%):    Between a nadir of 72 and a maximum of 98 %                                  O2 Saturation (minutes) <89%: 28 minutes  O2 saturation in minutes under 90%: 53 minutes, or 12.2% of total sleep time         Pulse  Rate Statistics:   Pulse Mean (bpm):   59 bpm              Pulse Range:    Between 47 and 95 bpm.  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   RECOMMENDATION: Severe sleep apnea that is REM sleep accentuated and hypoxia that is REM sleep dependent should be treated with CPAP for positive airway pressure therapy.    I recommend an autotitration CPAP device by [employer] or [employer], a setting between 6 and 15 cm water pressure, with 2 cm expiratory relief, heated humidification, and an interface of patient's choice that should be fitted in person.    INTERPRETING PHYSICIAN:   Larey Seat, MD   Medical Director of [facility] at [facility].

## 2022-04-09 NOTE — Addendum Note (Signed)
Addended by: Larey Seat on: 04/09/2022 03:16 PM   Modules accepted: Orders

## 2022-04-09 NOTE — Progress Notes (Signed)
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  RECOMMENDATION: Severe sleep apnea that is REM sleep accentuated and hypoxia that is REM sleep dependent should be treated with CPAP for positive airway pressure therapy.    I recommend an autotitration CPAP device by [employer] or [employer], a setting between 6 and 15 cm water pressure, with 2 cm expiratory relief, heated humidification, and an interface of patient's choice that should be fitted in person.

## 2022-04-10 ENCOUNTER — Telehealth: Payer: Self-pay

## 2022-04-10 DIAGNOSIS — M5416 Radiculopathy, lumbar region: Secondary | ICD-10-CM | POA: Diagnosis not present

## 2022-04-10 NOTE — Telephone Encounter (Signed)
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   RECOMMENDATION: Severe sleep apnea that is REM sleep accentuated and hypoxia that is REM sleep dependent should be treated with CPAP for positive airway pressure therapy.     I recommend an autotitration CPAP device by [employer] or [employer], a setting between 6 and 15 cm water pressure, with 2 cm expiratory relief, heated humidification, and an interface of patient's choice that should be fitted in person.

## 2022-04-10 NOTE — Telephone Encounter (Signed)
I called pt. I advised pt that Dr. Brett Fairy reviewed their sleep study results and found that pt has severe osa. Dr. Brett Fairy recommends that pt start an auto PAP at home. I reviewed PAP compliance expectations with the pt. Pt is agreeable to starting an auto-PAP. I advised pt that an order will be sent to a DME, [facility], and [facility] will call the pt within about one week after they file with the pt's insurance. [facility] will show the pt how to use the machine, fit for masks, and troubleshoot the auto-PAP if needed. A follow up appt was made for insurance purposes with Dr. Brett Fairy on 07/10/22 at 10:30am. Pt verbalized understanding to arrive 15 minutes early and bring their auto-PAP. Pt verbalized understanding of results. Pt had no questions at this time but was encouraged to call back if questions arise. I have sent the order to [facility] and have received confirmation that they have received the order.

## 2022-04-25 DIAGNOSIS — G4733 Obstructive sleep apnea (adult) (pediatric): Secondary | ICD-10-CM | POA: Diagnosis not present

## 2022-04-26 DIAGNOSIS — M5416 Radiculopathy, lumbar region: Secondary | ICD-10-CM | POA: Diagnosis not present

## 2022-05-11 ENCOUNTER — Encounter: Payer: Self-pay | Admitting: Neurology

## 2022-05-11 DIAGNOSIS — G4733 Obstructive sleep apnea (adult) (pediatric): Secondary | ICD-10-CM

## 2022-05-15 NOTE — Telephone Encounter (Signed)
Setup date 04/25/22:

## 2022-05-15 NOTE — Telephone Encounter (Signed)
Order faxed to [facility]. Received a receipt of confirmation.

## 2022-05-15 NOTE — Telephone Encounter (Signed)
Ok to reduce pressure to 5-11 cm for better tolerance.

## 2022-05-15 NOTE — Telephone Encounter (Signed)
Order placed for change in autopap pressure to 5-11 cm H20. Change made in [employer].

## 2022-05-15 NOTE — Addendum Note (Signed)
Addended by: Gildardo Griffes on: 05/15/2022 05:06 PM   Modules accepted: Orders

## 2022-05-16 ENCOUNTER — Other Ambulatory Visit: Payer: Self-pay | Admitting: Internal Medicine

## 2022-05-20 ENCOUNTER — Ambulatory Visit
Admission: EM | Admit: 2022-05-20 | Discharge: 2022-05-20 | Disposition: A | Discharge status: Home / Self Care | Payer: PPO | Attending: Urgent Care | Admitting: Urgent Care

## 2022-05-20 DIAGNOSIS — N3001 Acute cystitis with hematuria: Secondary | ICD-10-CM | POA: Insufficient documentation

## 2022-05-20 DIAGNOSIS — R350 Frequency of micturition: Secondary | ICD-10-CM | POA: Insufficient documentation

## 2022-05-20 DIAGNOSIS — N390 Urinary tract infection, site not specified: Secondary | ICD-10-CM | POA: Diagnosis not present

## 2022-05-20 HISTORY — DX: Sleep apnea, unspecified: G47.30

## 2022-05-20 LAB — POCT URINALYSIS DIP (MANUAL ENTRY)
Bilirubin, UA: NEGATIVE
Glucose, UA: NEGATIVE mg/dL
Ketones, POC UA: NEGATIVE mg/dL
Nitrite, UA: NEGATIVE
Protein Ur, POC: NEGATIVE mg/dL
Spec Grav, UA: 1.015 (ref 1.010–1.025)
Urobilinogen, UA: 0.2 E.U./dL
pH, UA: 6 (ref 5.0–8.0)

## 2022-05-20 MED ORDER — FLUCONAZOLE 150 MG PO TABS: 150.0000 mg | ORAL_TABLET | ORAL | 0 refills | Status: DC

## 2022-05-20 MED ORDER — CIPROFLOXACIN HCL 500 MG PO TABS: 500.0000 mg | ORAL_TABLET | Freq: Two times a day (BID) | ORAL | 0 refills | Status: DC

## 2022-05-20 NOTE — Discharge Instructions (Signed)
Please start ciprofloxacin to address an urinary tract infection.  You can use fluconazole to address a secondary yeast infection from taking antibiotic.  Make sure you hydrate very well with plain water and a quantity of 64 ounces of water a day.  Please limit drinks that are considered urinary irritants such as soda, sweet tea, coffee, energy drinks, alcohol.  These can worsen your urinary and genital symptoms but also be the source of them.  I will let you know about your urine culture results through MyChart to see if we need to prescribe or change your antibiotics based off of those results.

## 2022-05-20 NOTE — ED Triage Notes (Signed)
Pt c/o dysuria started this and-foul smelling urine last night-NAD-steady gait

## 2022-05-20 NOTE — ED Provider Notes (Signed)
[facility]  Note:  This document was prepared using Systems analyst and may include unintentional dictation errors.  MRN: [id number] DOB: 01-04-1946  Subjective:   Emily Thompson is a 77 y.o. female presenting for 1 day history of dysuria, urinary frequency and malodorous urine. Last urine culture showed multiple resistance to E. coli infection from 07/2020.   No current facility-administered medications for this encounter.  Current Outpatient Medications:    CALCIUM-VITAMIN D-VITAMIN K PO, Take 1,200 mg by mouth daily., Disp: , Rfl:    cholecalciferol (VITAMIN D) 1000 units tablet, Take 1,000 Units by mouth daily., Disp: , Rfl:    ezetimibe (ZETIA) 10 MG tablet, Take 1 tablet (10 mg total) by mouth daily., Disp: 90 tablet, Rfl: 1   hydrochlorothiazide (HYDRODIURIL) 12.5 MG tablet, Take 1 tablet (12.5 mg total) by mouth daily., Disp: 90 tablet, Rfl: 1   hydrocortisone 2.5 % cream, Apply topically., Disp: , Rfl:    ibandronate (BONIVA) 150 MG tablet, Take 1 tablet (150 mg total) by mouth every 30 (thirty) days. Take in am with a full glass of water,  do not  lie down for the next 30 min., Disp: 3 tablet, Rfl: 3   lansoprazole (PREVACID) 15 MG capsule, Take 15 mg by mouth daily at 12 noon., Disp: , Rfl:    meloxicam (MOBIC) 15 MG tablet, Take 1 tablet (15 mg total) by mouth daily as needed for pain., Disp: 90 tablet, Rfl: 0   metoprolol succinate (TOPROL-XL) 25 MG 24 hr tablet, Take 1 tablet (25 mg total) by mouth daily. TAKE WITH OR IMMEDIATELY FOLLOWING A MEAL. (Patient taking differently: Take 12.5 mg by mouth daily. TAKE WITH OR IMMEDIATELY FOLLOWING A MEAL.), Disp: 90 tablet, Rfl: 1   Multiple Vitamins-Minerals (PRESERVISION AREDS 2 PO), Take 1 capsule by mouth daily. Unknown strenght, Disp: , Rfl:    nystatin cream (MYCOSTATIN), Apply 1 Application topically 2 (two) times daily., Disp: 30 g, Rfl: 0   rosuvastatin (CRESTOR) 5 MG tablet,  TAKE 1 TABLET BY MOUTH 4 TIMES WEEKLY, Disp: 48 tablet, Rfl: 2   telmisartan (MICARDIS) 40 MG tablet, TAKE 1 TABLET BY MOUTH EVERY DAY (Patient taking differently: Take 40 mg by mouth daily.), Disp: 90 tablet, Rfl: 1   TIADYLT ER 240 MG 24 hr capsule, TAKE 1 CAPSULE BY MOUTH EVERY DAY, Disp: 90 capsule, Rfl: 1   Allergies  Allergen Reactions   Dextromethorphan     Per pt, increases BP   Hydrocodone     Wires her up SEVERE NAUSEA   Statins     Headache, hot flashes   Morphine And Related     Could not move SEVERE NAUSEA   Tape Rash    Past Medical History:  Diagnosis Date   Allergic rhinitis 08/13/2011   Annual physical exam 05/04/2011   BCC (basal cell carcinoma of skin) 2013, 2016   Breast cancer ([place]) 2003   chemo, radiation, lumpectomy, reconstructive surgery    Colon cancer screening 11/07/9483   Complication of anesthesia    Cough 05/27/2013   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     GERD (gastroesophageal reflux disease)    History of breast  cancer, left.  March 2003. 12/29/2010   HTN (hypertension) 03/14/2010   Qualifier: Diagnosis of  By: Larose Kells MD, [place]    Hyperlipidemia 03/14/2010   Qualifier: Diagnosis of  By: Larose Kells MD, [place]    Hypertension    Melanoma ([place]) 2012   righ side face; basal cell (nose)    Menopause 12/25/2010   Near syncope 10/26/2019   Osteopenia    OSTEOPENIA 03/14/2010   Qualifier: Diagnosis of  By: Larose Kells MD, [place]    Palpitations 05/20/2013   PCP NOTES >>>>>>>>>>>>>>>>>>>>>>>>>>>>>>>>>>>>> 07/03/2015   Personal history of colonic polyps 07/12/2020   PONV (postoperative nausea and vomiting)    Post-menopausal atrophic vaginitis 12/25/2010   Postmenopausal  bleeding 01/03/2015   Scoliosis    Sleep apnea    SVT (supraventricular tachycardia)    used to see Dr Rex Kras    Thickened endometrium 01/05/2015     Past Surgical History:  Procedure Laterality Date   ABDOMINAL HYSTERECTOMY  2016   BASAL CELL CARCINOMA EXCISION     BREAST LUMPECTOMY Left 07/2001   BREAST RECONSTRUCTION  2005   CESAREAN SECTION  1977   COLONOSCOPY W/ POLYPECTOMY  2013   NEXT DUE IN 5 YRS   NM MYOCAR PERF WALL MOTION  01/2006   bruce myoview; no evidence of inducible ischemia, anterior wall thinning without evidence of ischemia; post-stress EF 88%; low risk scan    NOSE SURGERY     basal cell carcinoma removed   R shoulder Arthroscopy and spur removal  02/15/20222   ROBOTIC ASSISTED TOTAL HYSTERECTOMY WITH BILATERAL SALPINGO OOPHERECTOMY Bilateral 01/27/2015   Procedure: ROBOTIC ASSISTED TOTAL HYSTERECTOMY WITH BILATERAL SALPINGO OOPHORECTOMY AND SENTINAL NODE BIOPSY;  Surgeon: Everitt Amber, MD;  Location: [facility];  Service: Gynecology;  Laterality: Bilateral;   SHOULDER SURGERY Right    SKIN CANCER EXCISION     melanoma   TONSILLECTOMY AND ADENOIDECTOMY  1952   TRANSTHORACIC ECHOCARDIOGRAM  12/2005   EF 50-55%; mild MR; trace TR   TUBAL LIGATION  1978   VULVA /PERINEUM BIOPSY     PAPILLOMA    Family History  Problem Relation Age of Onset   Hypertension Mother    Thyroid disease Mother        M and B (?)   Hypertension Father    Stroke Father        hemorrhagic CVA at age 58   Bipolar disorder Brother    Diabetes Brother    Colon cancer Maternal Aunt        dx at age 52s   Breast cancer Neg Hx     Social History   Tobacco Use   Smoking status: Former    Years: 10.00    Types: Cigarettes    Quit date: 05/20/1973    Years since quitting: 49.0   Smokeless tobacco: Never  Vaping Use   Vaping Use: Never used  Substance Use Topics   Alcohol use: Yes    Comment: occ   Drug use: No    ROS   Objective:   Vitals: BP (!) 162/89 (BP Location:  Right Arm)   Pulse 85   Temp 97.8 F (36.6 C) (Oral)   Resp 16   LMP 05/14/1997   SpO2 96%   Physical Exam Constitutional:      General: She is not in acute distress.    Appearance: Normal appearance. She is well-developed. She is not ill-appearing, toxic-appearing or diaphoretic.  HENT:     Head: Normocephalic and  atraumatic.     Nose: Nose normal.     Mouth/Throat:     Mouth: Mucous membranes are moist.  Eyes:     General: No scleral icterus.       Right eye: No discharge.        Left eye: No discharge.     Extraocular Movements: Extraocular movements intact.     Conjunctiva/sclera: Conjunctivae normal.  Cardiovascular:     Rate and Rhythm: Normal rate.  Pulmonary:     Effort: Pulmonary effort is normal.  Abdominal:     General: Bowel sounds are normal. There is no distension.     Palpations: Abdomen is soft. There is no mass.     Tenderness: There is no abdominal tenderness. There is no right CVA tenderness, left CVA tenderness, guarding or rebound.  Skin:    General: Skin is warm and dry.  Neurological:     General: No focal deficit present.     Mental Status: She is alert and oriented to person, place, and time.  Psychiatric:        Mood and Affect: Mood normal.        Behavior: Behavior normal.        Thought Content: Thought content normal.        Judgment: Judgment normal.     Results for orders placed or performed during the hospital encounter of 05/20/22 (from the past 24 hour(s))  POCT urinalysis dipstick     Status: Abnormal   Collection Time: 05/20/22 10:10 AM  Result Value Ref Range   Color, UA other (A) yellow   Clarity, UA cloudy (A) clear   Glucose, UA negative negative mg/dL   Bilirubin, UA negative negative   Ketones, POC UA negative negative mg/dL   Spec Grav, UA 1.015 1.010 - 1.025   Blood, UA large (A) negative   pH, UA 6.0 5.0 - 8.0   Protein Ur, POC negative negative mg/dL   Urobilinogen, UA 0.2 0.2 or 1.0 E.U./dL   Nitrite, UA  Negative Negative   Leukocytes, UA Small (1+) (A) Negative    Assessment and Plan :   PDMP not reviewed this encounter.  1. Acute cystitis with hematuria   2. Urinary frequency   3. Recurrent UTI     Creatinine clearance calculated at 56 mL/min using her creatinine level from 12/13/2021.  Start ciprofloxacin to cover for acute cystitis, urine culture pending.  I am using this particular antibiotic given the resistance seen on the previous urine culture.  Recommended aggressive hydration, limiting urinary irritants.  Provided with oral fluconazole to address antibiotic associated yeast infection.  Counseled patient on potential for adverse effects with medications prescribed/recommended today, ER and return-to-clinic precautions discussed, patient verbalized understanding.    Jaynee Eagles, PA-C 05/20/22 1013

## 2022-05-22 LAB — URINE CULTURE: Culture: 60000 — AB

## 2022-05-24 ENCOUNTER — Other Ambulatory Visit: Payer: Self-pay | Admitting: Neurology

## 2022-05-24 ENCOUNTER — Encounter: Payer: Self-pay | Admitting: Neurology

## 2022-05-24 DIAGNOSIS — G4733 Obstructive sleep apnea (adult) (pediatric): Secondary | ICD-10-CM

## 2022-05-24 NOTE — Telephone Encounter (Signed)
Okay to reduce autoPAP further to 4-9 cm for tolerance.

## 2022-05-24 NOTE — Telephone Encounter (Signed)
On 1/2 pressure was reduced 5-11 cm water pressure because of her concerns related to "air bubbles" I am unsure what next steps would be. She doesn't have air leak, her apneas are treated and it looks like she is averaging right at the 10.8 cm water pressure mark.

## 2022-05-26 DIAGNOSIS — G4733 Obstructive sleep apnea (adult) (pediatric): Secondary | ICD-10-CM | POA: Diagnosis not present

## 2022-06-04 DIAGNOSIS — M47816 Spondylosis without myelopathy or radiculopathy, lumbar region: Secondary | ICD-10-CM | POA: Diagnosis not present

## 2022-06-06 ENCOUNTER — Encounter: Payer: Self-pay | Admitting: Internal Medicine

## 2022-06-06 ENCOUNTER — Ambulatory Visit (INDEPENDENT_AMBULATORY_CARE_PROVIDER_SITE_OTHER): Payer: PPO | Admitting: Internal Medicine

## 2022-06-06 VITALS — BP 130/80 | HR 60 | Temp 97.6°F | Resp 16 | Ht 58.75 in | Wt 148.5 lb

## 2022-06-06 DIAGNOSIS — I10 Essential (primary) hypertension: Secondary | ICD-10-CM

## 2022-06-06 DIAGNOSIS — Z1211 Encounter for screening for malignant neoplasm of colon: Secondary | ICD-10-CM

## 2022-06-06 DIAGNOSIS — Z0000 Encounter for general adult medical examination without abnormal findings: Secondary | ICD-10-CM

## 2022-06-06 DIAGNOSIS — E782 Mixed hyperlipidemia: Secondary | ICD-10-CM

## 2022-06-06 LAB — BASIC METABOLIC PANEL
BUN: 20 mg/dL (ref 6–23)
CO2: 30 mEq/L (ref 19–32)
Calcium: 9.6 mg/dL (ref 8.4–10.5)
Chloride: 101 mEq/L (ref 96–112)
Creatinine, Ser: 0.69 mg/dL (ref 0.40–1.20)
GFR: 83.08 mL/min (ref >60.00)
Glucose, Bld: 86 mg/dL (ref 70–99)
Potassium: 4.3 mEq/L (ref 3.5–5.1)
Sodium: 139 mEq/L (ref 135–145)

## 2022-06-06 LAB — LIPID PANEL
Cholesterol: 189 mg/dL (ref 0–200)
HDL: 74.4 mg/dL (ref >39.00)
LDL Cholesterol: 101 mg/dL — ABNORMAL HIGH (ref 0–99)
NonHDL: 114.84
Total CHOL/HDL Ratio: 3
Triglycerides: 67 mg/dL (ref 0.0–149.0)
VLDL: 13.4 mg/dL (ref 0.0–40.0)

## 2022-06-06 NOTE — Assessment & Plan Note (Signed)
Here for CPX HTN: BP is very good, continue HCTZ,  olmesartan, diltiazem and metoprolol . CMP High chol: cont zetia-crestor, FLP Osteopenia: Per gynecology DJD: Seen at ortho regards a radiculopathy. Breast, endometrial cancer: History of, up-to-date on mammograms.  Sees gynecology History of melanoma: Sees dermatology OSA: New diagnosis, on a CPAP. Lower extremity edema: Not an issue at this point. RTC 6 months

## 2022-06-06 NOTE — Patient Instructions (Addendum)
Vaccines I recommend:  Tdap (tetanus)   Check the  blood pressure regularly BP GOAL is between 110/65 and  135/85. If it is consistently higher or lower, let me know  We are referring you to gastroenterology   GO TO THE LAB : Get the blood work     [place] back for checkup in 6 months

## 2022-06-06 NOTE — Progress Notes (Signed)
Subjective:    Patient ID: Emily Thompson, female    DOB: June 18, 1945, 77 y.o.   MRN: [id number]  DOS:  06/06/2022 Type of visit - description: CPX  Here for CPX. In general feeling well. Since the last visit was diagnosed with OSA. No further palpitations, symptoms controlled. Recently had a UTI, symptoms resolved after antibiotics.   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     GERD (gastroesophageal reflux disease)    History of breast cancer, left.  March 2003. 12/29/2010   HTN (hypertension) 03/14/2010   Qualifier: Diagnosis of  By: Larose Kells MD, [place]    Hyperlipidemia 03/14/2010   Qualifier: Diagnosis of  By: Larose Kells MD, [place]    Hypertension    Melanoma ([place]) 2012   righ side face; basal cell (nose)    Menopause 12/25/2010   Near syncope 10/26/2019   Osteopenia    OSTEOPENIA 03/14/2010   Qualifier: Diagnosis of  By: Larose Kells MD, [place]    Palpitations 05/20/2013   PCP NOTES >>>>>>>>>>>>>>>>>>>>>>>>>>>>>>>>>>>>> 07/03/2015   Personal history of colonic polyps 07/12/2020   PONV (postoperative nausea and vomiting)    Post-menopausal atrophic vaginitis 12/25/2010   Postmenopausal bleeding 01/03/2015   Scoliosis    Sleep apnea    SVT (supraventricular tachycardia)    used to see Dr Rex Kras     Thickened endometrium 01/05/2015    Past Surgical History:  Procedure Laterality Date   ABDOMINAL HYSTERECTOMY  2016   BASAL CELL CARCINOMA EXCISION     BREAST LUMPECTOMY Left 07/2001   BREAST RECONSTRUCTION  2005   CESAREAN SECTION  1977   COLONOSCOPY W/ POLYPECTOMY  2013   NEXT DUE IN 5 YRS   NM MYOCAR PERF WALL MOTION  01/2006   bruce myoview; no evidence of inducible ischemia, anterior wall thinning without evidence of ischemia; post-stress EF 88%; low risk scan    NOSE SURGERY     basal cell carcinoma removed   R shoulder Arthroscopy and spur removal  02/15/20222   ROBOTIC ASSISTED TOTAL HYSTERECTOMY WITH BILATERAL SALPINGO OOPHERECTOMY Bilateral 01/27/2015   Procedure: ROBOTIC ASSISTED TOTAL HYSTERECTOMY WITH BILATERAL SALPINGO OOPHORECTOMY AND SENTINAL NODE BIOPSY;  Surgeon: Everitt Amber, MD;  Location: [facility];  Service: Gynecology;  Laterality: Bilateral;   SHOULDER SURGERY Right    SKIN CANCER EXCISION     melanoma   TONSILLECTOMY AND ADENOIDECTOMY  1952   TRANSTHORACIC ECHOCARDIOGRAM  12/2005   EF 50-55%; mild MR; trace TR   TUBAL LIGATION  1978   VULVA /PERINEUM BIOPSY     PAPILLOMA   Social History   Socioeconomic History   Marital status: Widowed    Spouse name: Not on file   Number of children: 2   Years of education:  BSN   Highest education level: Not on file  Occupational History   Occupation: Marine scientist, 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[employer])    Employer: RETIRED  Tobacco Use   Smoking status: Former    Years: 10.00    Types: Cigarettes    Quit date: 05/20/1973    Years since quitting: 49.0   Smokeless tobacco: Never  Vaping Use   Vaping Use: Never used  Substance and Sexual Activity   Alcohol use: Yes    Comment: occ   Drug use: No   Sexual activity: Not Currently    Birth control/protection: Post-menopausal    Comment: First IC >54 y/o, Partners <5  Other Topics Concern   Not on file  Social History Narrative   Widow, 1 adopted and 1 natural  child; ([place], [place])   Live by herself   Lost her long-term companion Jenny Reichmann [place]  09/11/2021   Social Determinants of Health   Financial Resource Strain: Not on file  Food Insecurity: Not on file  Transportation Needs: Not on file  Physical Activity: Not on file  Stress: Not on file  Social Connections: Not on file  Intimate Partner Violence: Not on file    Current Outpatient Medications  Medication Instructions   CALCIUM-VITAMIN D-VITAMIN K PO 1,200 mg, Oral, Daily   cholecalciferol (VITAMIN D) 1,000 Units, Oral, Daily   ezetimibe (ZETIA) 10 mg, Oral, Daily   hydrochlorothiazide (HYDRODIURIL) 12.5 mg, Oral, Daily   hydrocortisone 2.5 % cream Topical   ibandronate (BONIVA) 150 mg, Oral, Every 30 days, Take in am with a full glass of water,  do not  lie down for the next 30 min.   lansoprazole (PREVACID) 15 mg, Oral, Daily   meloxicam (MOBIC) 15 mg, Oral, Daily PRN   metoprolol succinate (TOPROL-XL) 12.5 mg, Oral, Daily, TAKE WITH OR IMMEDIATELY FOLLOWING A MEAL.   Multiple Vitamins-Minerals (PRESERVISION AREDS 2 PO) 1 capsule, Oral, Daily, Unknown strenght   nystatin cream (MYCOSTATIN) 1 Application, Topical, 2 times daily   Probiotic Product (PROBIOTIC-10 PO) Oral   rosuvastatin (CRESTOR) 5 MG tablet TAKE 1 TABLET BY MOUTH 4 TIMES WEEKLY   telmisartan (MICARDIS) 40 MG tablet TAKE 1 TABLET BY MOUTH EVERY DAY   TIADYLT ER 240 MG 24 hr capsule TAKE 1 CAPSULE BY MOUTH EVERY DAY       Objective:   Physical Exam BP 130/80   Pulse 60   Temp 97.6 F (36.4 C) (Oral)   Resp 16   Ht 4' 10.75" (1.492 m)   Wt 148 lb 8 oz (67.4 kg)   LMP 05/14/1997   SpO2 97%   BMI 30.25 kg/m  General: Well developed, NAD, BMI noted Neck: No  thyromegaly  HEENT:  Normocephalic . Face symmetric, atraumatic Lungs:  CTA B Normal respiratory effort, no intercostal retractions, no accessory muscle use. Heart: RRR,  no murmur.  Abdomen:  Not distended, soft, non-tender. No rebound or rigidity.    Lower extremities: no pretibial edema bilaterally  Skin: Exposed areas without rash. Not pale. Not jaundice Neurologic:  alert & oriented X3.  Speech normal, gait appropriate for age and unassisted Strength symmetric and appropriate for age.  Psych: Cognition and judgment appear intact.  Cooperative with normal attention span and concentration.  Behavior appropriate. No anxious or depressed appearing.     Assessment     Assessment HTN Hyperlipidemia   GERD Osteopenia: Per gynecology.  dexa 2016 (per gyn); rx boniva 05-2017  Insomnia  MSK: ---DJD,  NSAIDs prn --  Back pain, --Dr Tommie Raymond ~ 2011, saw the spine center ~ 2015 , dx w/  spinal stenosis (no MRI, clinical dx ) --2017-2018: PT, injections Dr Catha Brow Cough-- hycodan prn (tolerates well small doses hydrocodone) Oncology: --Breast cancer 2003 --Endometrial cancer, H-BSO 01/2015, will see hematology x 5 years (gyn-onc) --Melanoma, BCC (Dr Delman Cheadle) Lung nodule, 46m, per CTs, last CT 09/30/2015 : stable  H/o SVT  HOH OSA: Severe, Dx 2023. On Cpap   PLAN: Here for CPX HTN: BP is very good, continue HCTZ,  olmesartan, diltiazem and metoprolol . CMP High chol: cont zetia-crestor, FLP Osteopenia: Per gynecology DJD: Seen at ortho regards a radiculopathy. Breast, endometrial cancer: History of, up-to-date on mammograms.  Sees gynecology History of melanoma: Sees dermatology OSA: New diagnosis, on a CPAP. Lower extremity edema: Not an issue at this point. RTC 6 months

## 2022-06-06 NOTE — Assessment & Plan Note (Signed)
-   Td 2014, booster rec  - PNM 23: 2010, boster 01/2019;  prevnar: 2016 - PNM 20: next year  - s/p RSV per pt   - S/p zostavax;  s/p shingrix   - COVID  vaxs: utd per pt  - had a flu shot already -CCS:    Dr Collene Mares , 2nd cscope  09-2011; had a cscope 2018, 5 years; refer for consideration of cont w/ CCS -Female care per gyn, plans to see them next month,  last [facility]G 12-2021 ([facility])  -Lifestyle:  active, eating healthy -Labs: CMP, [place]: On file

## 2022-06-26 DIAGNOSIS — H353132 Nonexudative age-related macular degeneration, bilateral, intermediate dry stage: Secondary | ICD-10-CM | POA: Diagnosis not present

## 2022-06-26 DIAGNOSIS — G4733 Obstructive sleep apnea (adult) (pediatric): Secondary | ICD-10-CM | POA: Diagnosis not present

## 2022-07-04 DIAGNOSIS — G4733 Obstructive sleep apnea (adult) (pediatric): Secondary | ICD-10-CM | POA: Diagnosis not present

## 2022-07-06 ENCOUNTER — Encounter: Payer: Self-pay | Admitting: Internal Medicine

## 2022-07-10 ENCOUNTER — Encounter: Payer: Self-pay | Admitting: Neurology

## 2022-07-10 ENCOUNTER — Ambulatory Visit: Payer: PPO | Admitting: Neurology

## 2022-07-10 VITALS — BP 157/84 | HR 72 | Ht 60.0 in | Wt 148.0 lb

## 2022-07-10 DIAGNOSIS — G4733 Obstructive sleep apnea (adult) (pediatric): Secondary | ICD-10-CM

## 2022-07-10 DIAGNOSIS — G4734 Idiopathic sleep related nonobstructive alveolar hypoventilation: Secondary | ICD-10-CM

## 2022-07-10 NOTE — Patient Instructions (Addendum)
We will work with DME on the mask fitting.    I gave her an AIRFIT N 30 I  with small nasal cradle halo and  A ResMed P10 nasal pillow , small .  Both can be tried at home  and she can call me  which one she likes best . That one I will order for her through her DME.   we will concentrate on making CPAP use the most pleasant for her - she has aerophagia and skin irritation-   I will reset her machine from 4-11 cm water and EPR of 3 cm water.

## 2022-07-10 NOTE — Progress Notes (Signed)
SLEEP MEDICINE CLINIC    SLEEP MEDICINE CLINIC    Provider:  Larey Seat, MD  Primary Care Physician:  Colon Branch, [address] [place] [address]     Referring Provider: Colon Branch, [address],  [place] [address]          Chief Complaint according to patient   Patient presents with:     New on CPAP - Patient (Initial Visit)           HISTORY OF PRESENT ILLNESS:  Emily Thompson is a 77 y.o. Caucasian female patient seen here in RV on new CPAP on  07/10/2022.  Here for initial CPAP folllow up.  Had severe sleep apnea in her test 04-03-2022.   The overall AHI was 31.6, the REM sleep AHI was 45 and the non-REM REM sleep apnea hypopnea index was 27.  There were no chest wall data available.  I cannot answer questions about sleep position but the patient had 53 minutes in hypoxia the equivalent of 12.2% of total sleep time.  This means that she is not a candidate for any other therapy ( dental, [facility]) than by positive airway pressure.   This HST confirms the presence of severe sleep apnea which is REM sleep dominant but not REM sleep dependent, associated with a significant degree of hypoxemia in REM sleep.   RECOMMENDATION: Severe sleep apnea that is REM sleep accentuated and hypoxia that is REM sleep dependent and should be treated with CPAP for positive airway pressure therapy.     I recommended an autotitration CPAP device by [employer] or [employer], a setting between 6 and 15 cm water pressure, with 2 cm expiratory relief, heated humidification, and an interface of patient's choice that should be fitted in person.   She was given a nasal mask,  Set up with [facility] 04/27/22, pt states that she has fair skin her constantly has redness to her nose and face. Still wakes up in the middle of the night. Around 5/6 am she will take it off cause she can't stand it any longer. She has noted she is belching, swallows  air. Today's compliance report is excellent she has used the machine 100% of 30 days and all these days over 4 hours actually for 7 hours 15 minutes on average.  The minimum pressure is set at 5 the maximum at 9 cm water was 2 cm EPR and the 95th percentile pressure is 9 cm water so she actually needs a higher pressure but finds it hard to tolerate.  Her residual AHI is now 1.7/h this is a reduction in apnea by over 90%.  I would like for her to try a maximum pressure of 10 cmH2O and I would also increase the EPR to 3 cm water to make it easier to exhale.  I will offer the patient a nasal pillow or nasal cradle option that sits under the nose as her skin has become irritated.  She feels that for 1 good night she gets 2 bad nights and that is not a good statistic.  Post CPAP : Epworth sleepiness score was endorsed at 8 points fatigue severity was endorsed at 9 points and the geriatric depression score was endorsed at 1 out of 15 points.             Originally seen on 01-25-2022  from Dr Larose Kells for a sleep evaluation.  Chief concern according to patient : 2023 May First, her long time partner died in his sleep- on her bed-  she currently sleeps five hours a night.  Mind is racing.  Nocturnal palpitations. Insomnia. Nocturia. She has GERD some nights.  She was told she snores in her sleep. Pt stated she is waking up at three AM before May event. If she can  go back to sleep in AM the sleep is of good quality. Dreams.    I have the pleasure of seeing Emily Thompson on [id number], female with a possible sleep disorder.  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  March 2003. (12/29/2010), HTN (hypertension) (03/14/2010), Hyperlipidemia (03/14/2010), Hypertension, Melanoma ([place]) (2012), Menopause (12/25/2010), Near syncope (10/26/2019), Osteopenia, OSTEOPENIA (03/14/2010), Palpitations (05/20/2013), (07/03/2015), Personal history of colonic polyps (07/12/2020), PONV (postoperative nausea and vomiting), Post-menopausal atrophic vaginitis (12/25/2010), Postmenopausal bleeding (01/03/2015), Scoliosis, Sleep apnea, SVT (supraventricular tachycardia), and Thickened endometrium (01/05/2015)..     Sleep relevant medical history: Snoring some nights, Nocturia 2-3 times,  Tonsillectomy with adenoid ectomy- , cervical spine DDD, Low back DDD, deviated septum , nasal congestion. Family medical /sleep history: late father was a loud snorer with OSA. 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 Widowed I 2000, had a BF for 18 years.  Has  2 adult children, 2 grandchildren.  Tobacco use: quit 1973.  ETOH use : no more than 2 a week, , Caffeine intake in form of Coffee( 1 cup in AM) Soda( 1 coke a week ) dark chocolate- daily- no energy drinks. Regular exercise in form of  walking .      Sleep habits are as follows: The patient's dinner time is between  6-6.30 PM. The patient goes to bed at 10-11 PM and continues to sleep for 5 hours, but fragmented sleep-wakes for several  bathroom breaks, the first time at 4-5 AM.   The preferred sleep position is laterally and supine , depending on shoulder pain and comfort- with the support of 1-2 pillows. Dreams are reportedly frequent/vivid.  7.00  AM is the usual rise time. The patient wakes up spontaneously at 5 AM ,sometimes with palpitations-  She reports not feeling  refreshed or restored in AM, with symptoms such as dry mouth and residual fatigue.  Naps are taken infrequently, " I don't like to nap". If a nap is taken its a power nap, 15-20 minutes and feels refreshed.     Review of Systems: Out of a complete 14 system review, the patient complains of only the following symptoms, and all other reviewed systems are negative.:  Fatigue, sleepiness , snoring, fragmented sleep, Insomnia    How likely are you to doze in the following situations: 0 = not likely, 1 = slight chance, 2 = moderate chance, 3 = high chance   Sitting and Reading? Watching Television? Sitting inactive in a public place (theater or meeting)? As a passenger in a car for an hour without a break? Lying down in the afternoon when circumstances permit? Sitting and talking to  someone? Sitting quietly after lunch without alcohol? In a car, while stopped for a few minutes in traffic?   Post CPAP : Epworth sleepiness score was endorsed at 8 points and  fatigue severity was endorsed at 9 points and the geriatric depression score was endorsed at 1 out of 15 points.  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    Employer: RETIRED  Tobacco Use   Smoking status: Former    Years: 10.00    Types: Cigarettes    Quit date: 05/20/1973    Years since quitting: 48.7   Smokeless tobacco: Never  Vaping Use   Vaping Use: Never used  Substance and Sexual Activity   Alcohol use: Not Currently    Comment: Occas   Drug use: No   Sexual activity: Not Currently    Birth control/protection: Post-menopausal    Comment: 1st intercourse 77 yo-fewer than 5 partners  Other Topics Concern   Not on file  Social History Narrative   Widow, 1 adopted and 1 natural child; ([place],  [place])   Live by herself   Lost her long-term companion Jenny Reichmann [place]  09/11/2021   Social Determinants of Health   Financial Resource Strain: Not on file  Food Insecurity: Not on file  Transportation Needs: Not on file  Physical Activity: Not on file  Stress: Not on file  Social Connections: Not on file    Family History  Problem Relation Age of Onset   Hypertension Mother    Thyroid disease Mother        M and B (?)   Hypertension Father    Stroke Father        hemorrhagic CVA at age 68   Bipolar disorder Brother    Diabetes Brother    Colon cancer Maternal Aunt        dx at age 72s   Breast cancer Neg Hx     Past Medical History:  Diagnosis Date   Allergic rhinitis 08/13/2011   Annual physical exam 05/04/2011   BCC (basal cell carcinoma of skin) 2013, 2016   Breast cancer ([place]) 2003   chemo, radiation, lumpectomy, reconstructive surgery    Colon cancer screening 99991111   Complication of anesthesia    Cough 05/27/2013   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   DJD (degenerative joint disease) 05/27/2013   Dyslipidemia    Endometrial cancer ([place])    Family history of malignant neoplasm of gastrointestinal tract 07/12/2020   Flatulence, eructation and gas pain 07/12/2020   GERD 03/14/2010   Qualifier: Diagnosis of  By: Larose Kells MD, Jose E.     GERD (gastroesophageal reflux disease)    History of breast cancer, left.  March 2003. 12/29/2010   HTN (hypertension) 03/14/2010   Qualifier: Diagnosis of  By: Larose Kells MD, [place]    Hyperlipidemia 03/14/2010   Qualifier: Diagnosis of  By: Larose Kells MD, [place]    Hypertension    Melanoma ([place]) 2012   righ side face; basal cell (nose)    Menopause 12/25/2010   Near syncope 10/26/2019   Osteopenia    OSTEOPENIA 03/14/2010   Qualifier: Diagnosis of  By: Larose Kells MD, [place]    Palpitations 05/20/2013   PCP NOTES >>>>>>>>>>>>>>>>>>>>>>>>>>>>>>>>>>>>>  07/03/2015   Personal history of colonic polyps 07/12/2020   PONV (postoperative nausea  and vomiting)    Post-menopausal atrophic vaginitis 12/25/2010   Postmenopausal bleeding 01/03/2015   Scoliosis    Sleep apnea    SVT (supraventricular tachycardia)    used to see Dr Rex Kras    Thickened endometrium 01/05/2015    Past Surgical History:  Procedure Laterality Date   ABDOMINAL HYSTERECTOMY  2016   BASAL CELL CARCINOMA EXCISION     BREAST LUMPECTOMY Left 07/2001   BREAST RECONSTRUCTION  2005   CESAREAN SECTION  1977   COLONOSCOPY W/ POLYPECTOMY  2013   NEXT DUE IN 5 YRS   NM MYOCAR PERF WALL MOTION  01/2006   bruce myoview; no evidence of inducible ischemia, anterior wall thinning without evidence of ischemia; post-stress EF 88%; low risk scan    NOSE SURGERY     basal cell carcinoma removed   R shoulder Arthroscopy and spur removal  02/15/20222   ROBOTIC ASSISTED TOTAL HYSTERECTOMY WITH BILATERAL SALPINGO OOPHERECTOMY Bilateral 01/27/2015   Procedure: ROBOTIC ASSISTED TOTAL HYSTERECTOMY WITH BILATERAL SALPINGO OOPHORECTOMY AND SENTINAL NODE BIOPSY;  Surgeon: Everitt Amber, MD;  Location: [facility];  Service: Gynecology;  Laterality: Bilateral;   SHOULDER SURGERY Right    SKIN CANCER EXCISION     melanoma   TONSILLECTOMY AND ADENOIDECTOMY  1952   TRANSTHORACIC ECHOCARDIOGRAM  12/2005   EF 50-55%; mild MR; trace TR   TUBAL LIGATION  1978   VULVA /PERINEUM BIOPSY     PAPILLOMA     Current Outpatient Medications on File Prior to Visit  Medication Sig Dispense Refill   CALCIUM-VITAMIN D-VITAMIN K PO Take 1,200 mg by mouth daily.     cholecalciferol (VITAMIN D) 1000 units tablet Take 1,000 Units by mouth daily.     ezetimibe (ZETIA) 10 MG tablet Take 1 tablet (10 mg total) by mouth daily. 90 tablet 1   hydrochlorothiazide (HYDRODIURIL) 12.5 MG tablet Take 1 tablet (12.5 mg total) by mouth daily. 90 tablet 1   hydrocortisone 2.5 % cream Apply topically.     ibandronate (BONIVA) 150 MG tablet Take 1 tablet (150 mg total) by mouth every 30 (thirty) days. Take in am with a  full glass of water,  do not  lie down for the next 30 min. 3 tablet 3   lansoprazole (PREVACID) 15 MG capsule Take 15 mg by mouth daily at 12 noon.     meloxicam (MOBIC) 15 MG tablet Take 1 tablet (15 mg total) by mouth daily as needed for pain. 90 tablet 0   metoprolol succinate (TOPROL-XL) 25 MG 24 hr tablet Take 0.5 tablets (12.5 mg total) by mouth daily. TAKE WITH OR IMMEDIATELY FOLLOWING A MEAL.     Multiple Vitamins-Minerals (PRESERVISION AREDS 2 PO) Take 2 capsules by mouth daily. Unknown strenght     nystatin cream (MYCOSTATIN) Apply 1 Application topically 2 (two) times daily. 30 g 0   Probiotic Product (PROBIOTIC-10 PO) Take by mouth.     rosuvastatin (CRESTOR) 5 MG tablet TAKE 1 TABLET BY MOUTH 4 TIMES WEEKLY 48 tablet 2   telmisartan (MICARDIS) 40 MG tablet TAKE 1 TABLET BY MOUTH EVERY DAY (Patient taking differently: Take 40 mg by mouth daily.) 90 tablet 1   TIADYLT ER 240 MG 24 hr capsule TAKE 1 CAPSULE BY MOUTH EVERY DAY 90 capsule 1   No current facility-administered medications on file prior to visit.    Allergies  Allergen Reactions   Dextromethorphan  Per pt, increases BP   Hydrocodone     Wires her up SEVERE NAUSEA   Statins     Headache, hot flashes   Morphine And Related     Could not move SEVERE NAUSEA   Tape Rash    Physical exam:  Today's Vitals   07/10/22 1018  BP: (!) 157/84  Pulse: 72  Weight: 148 lb (67.1 kg)  Height: 5' (1.524 m)   Body mass index is 28.9 kg/m.   Wt Readings from Last 3 Encounters:  07/10/22 148 lb (67.1 kg)  06/06/22 148 lb 8 oz (67.4 kg)  03/19/22 149 lb (67.6 kg)     Ht Readings from Last 3 Encounters:  07/10/22 5' (1.524 m)  06/06/22 4' 10.75" (1.492 m)  03/19/22 4' 10.75" (1.492 m)      General: The patient is awake, alert and appears not in acute distress. The patient is well groomed. Head: Normocephalic, atraumatic. Neck is supple. Mallampati 2-3,  neck circumference:13. 5 inches . Nasal airflow  congested.    Dental status: irregular  Cardiovascular:  Regular rate and cardiac rhythm by pulse,  without distended neck veins. Respiratory: Lungs are clear to auscultation.  Skin: ankle edema, facial rash, nasal bridge irritation and  and cheek  Trunk: The patient's posture is erect.   Neurologic exam : The patient is awake and alert, oriented to place and time.   Memory subjective described as intact.  Attention span & concentration ability appears normal.  Speech is fluent,  without  dysarthria, dysphonia or aphasia.  Mood and affect are appropriate.   Cranial nerves: no loss of smell or taste reported   Facial sensation intact to fine touch.  Facial motor strength is symmetric and tongue and uvula move midline.  Neck ROM : rotation, tilt and flexion extension were normal for age and shoulder shrug was symmetrical.      After spending a total time of 25 minutes face to face and additional time for physical and neurologic examination, review of laboratory studies,  personal review of imaging studies, reports and results of other testing and review of referral information / records as far as provided in visit, I have established the following assessment and Plan :  1) severe OSA and clinically significant hypoxia, REM sleep AHI was much higher than her NREM AHI- this apnea needs positive airway pressure, not a dental device and not inspire.  2) we will concentrate on making CPAP use the most pleasant for her - she has aerophagia and skin irritation-   I will reset her machine from 4-11 cm water and EPR of 3 cm water.  I will offer her a nasal cradle mask- her nostrils are asymmetric and she has had a MOHs procedure on the right cheek and left nasion.   We will work with DME on the mask fitting. I gave her an AIRFIT N 30 I  with small nasal cradle halo and  A ResMed P10 nasal pillow , small .  Both can be tried at home . She can call me  here anytime in office hours and we get an  order out.     I would like to thank Colon Branch, MD and Colon Branch, [address],  [place] [address] for allowing me to meet with and to take care of this pleasant patient.   CC: I will share my notes with PCP.  Electronically signed by: Larey Seat, MD 07/10/2022  10:32 AM  [facility] and [employer] certified by [employer] of Sleep Medicine and Diplomate of the [employer] of Sleep Medicine. Board certified In Neurology through the [place], Fellow of the [employer] of Neurology. Medical Director of [employer].

## 2022-07-11 DIAGNOSIS — M47816 Spondylosis without myelopathy or radiculopathy, lumbar region: Secondary | ICD-10-CM | POA: Diagnosis not present

## 2022-07-12 MED ORDER — METOPROLOL SUCCINATE ER 25 MG PO TB24: 12.5000 mg | ORAL_TABLET | Freq: Every day | ORAL | 1 refills | Status: DC

## 2022-07-12 NOTE — Addendum Note (Signed)
Addended byDamita Dunnings D on: 07/12/2022 10:40 AM   Modules accepted: Orders

## 2022-07-13 ENCOUNTER — Encounter: Payer: Self-pay | Admitting: Neurology

## 2022-07-16 ENCOUNTER — Encounter: Payer: Self-pay | Admitting: Neurology

## 2022-07-16 DIAGNOSIS — G4733 Obstructive sleep apnea (adult) (pediatric): Secondary | ICD-10-CM

## 2022-07-16 NOTE — Addendum Note (Signed)
Addended by: Darleen Crocker on: 07/16/2022 08:47 AM   Modules accepted: Orders

## 2022-07-17 DIAGNOSIS — M419 Scoliosis, unspecified: Secondary | ICD-10-CM | POA: Insufficient documentation

## 2022-07-18 ENCOUNTER — Ambulatory Visit: Payer: PPO | Admitting: Cardiology

## 2022-07-18 ENCOUNTER — Other Ambulatory Visit: Payer: Self-pay | Admitting: Internal Medicine

## 2022-07-21 ENCOUNTER — Ambulatory Visit
Admission: EM | Admit: 2022-07-21 | Discharge: 2022-07-21 | Disposition: A | Discharge status: Home / Self Care | Payer: PPO | Attending: Nurse Practitioner | Admitting: Nurse Practitioner

## 2022-07-21 DIAGNOSIS — N3000 Acute cystitis without hematuria: Secondary | ICD-10-CM | POA: Diagnosis not present

## 2022-07-21 LAB — POCT URINALYSIS DIP (MANUAL ENTRY)
Bilirubin, UA: NEGATIVE
Blood, UA: NEGATIVE
Glucose, UA: NEGATIVE mg/dL
Ketones, POC UA: NEGATIVE mg/dL
Nitrite, UA: NEGATIVE
Protein Ur, POC: NEGATIVE mg/dL
Spec Grav, UA: 1.01 (ref 1.010–1.025)
Urobilinogen, UA: 0.2 E.U./dL
pH, UA: 7 (ref 5.0–8.0)

## 2022-07-21 MED ORDER — CEPHALEXIN 500 MG PO CAPS: 500.0000 mg | ORAL_CAPSULE | Freq: Two times a day (BID) | ORAL | 0 refills | Status: AC

## 2022-07-21 NOTE — Discharge Instructions (Signed)
The clinic will contact you with results of the urine culture done today Start Keflex twice daily for 7 days Increase fluids Follow-up with your PCP if symptoms do not improve Please go to the emergency room for any worsening symptoms

## 2022-07-21 NOTE — ED Provider Notes (Signed)
[facility]    CSN: [id number] Arrival date & time: 07/21/22  1156      History   Chief Complaint Chief Complaint  Patient presents with   Urinary Tract Infection    HPI Emily Thompson is a 77 y.o. female presents for evaluation of dysuria.  Patient reports today she awoke with urinary burning.  Denies urgency, frequency, hematuria, fevers, nausea/vomiting, flank pain.  No vaginal discharge or STD concern.  Reports remote history of UTIs, denies history of pyelonephritis.  She did a home UTI test and it showed leukocytes prompting her to come in for evaluation.  She has not used any OTC medications for symptoms.  No other concerns at this time.   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     GERD (gastroesophageal reflux disease)    History of breast cancer, left.  March 2003. 12/29/2010   HTN (hypertension) 03/14/2010   Qualifier: Diagnosis of  By: Larose Kells MD, [place]    Hyperlipidemia 03/14/2010   Qualifier: Diagnosis of  By: Larose Kells MD, [place]    Hypertension    Melanoma ([place]) 2012   righ side face; basal cell (nose)    Menopause 12/25/2010   Near syncope 10/26/2019   Osteopenia    OSTEOPENIA 03/14/2010   Qualifier: Diagnosis of  By: Larose Kells MD, [place]    Palpitations 05/20/2013   PCP NOTES >>>>>>>>>>>>>>>>>>>>>>>>>>>>>>>>>>>>> 07/03/2015    Personal history of colonic polyps 07/12/2020   PONV (postoperative nausea and vomiting)    Post-menopausal atrophic vaginitis 12/25/2010   Postmenopausal bleeding 01/03/2015   Scoliosis    Sleep apnea    SVT (supraventricular tachycardia)    used to see Dr Rex Kras    Thickened endometrium 01/05/2015    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   Family history of malignant neoplasm of gastrointestinal tract 07/12/2020   Colon cancer screening 07/12/2020   Flatulence, eructation and gas pain 07/12/2020   SVT (supraventricular tachycardia)    PONV (postoperative nausea and vomiting)    Osteopenia    Hypertension    GERD (gastroesophageal reflux disease)    Dyslipidemia    Complication of anesthesia    BCC (basal cell carcinoma of skin)    Near syncope 10/26/2019   PCP NOTES >>>>>>>>>>>>>>>>>>>>>>>>>>>>>>>>>>>>> 07/03/2015   Endometrial cancer ([place]) 01/27/2015   Thickened endometrium 01/05/2015   Postmenopausal bleeding 01/03/2015   DJD (degenerative joint disease) 05/27/2013   Cough 05/27/2013   Palpitations 05/20/2013   Melanoma ([place])    Allergic rhinitis 08/13/2011   Annual physical exam 05/04/2011   History of breast cancer, left.  March 2003. 12/29/2010   Breast cancer ([place]) 12/25/2010   Menopause  12/25/2010   Post-menopausal atrophic vaginitis 12/25/2010   Hyperlipidemia 03/14/2010   HTN (hypertension) 03/14/2010   GERD 03/14/2010   OSTEOPENIA 03/14/2010    Past Surgical History:  Procedure Laterality Date   ABDOMINAL HYSTERECTOMY  2016   BASAL CELL CARCINOMA EXCISION     BREAST LUMPECTOMY Left 07/2001   BREAST RECONSTRUCTION  2005   CESAREAN SECTION  1977   COLONOSCOPY W/ POLYPECTOMY  2013   NEXT DUE IN 5 YRS   NM MYOCAR PERF WALL MOTION  01/2006   bruce myoview; no  evidence of inducible ischemia, anterior wall thinning without evidence of ischemia; post-stress EF 88%; low risk scan    NOSE SURGERY     basal cell carcinoma removed   R shoulder Arthroscopy and spur removal  02/15/20222   ROBOTIC ASSISTED TOTAL HYSTERECTOMY WITH BILATERAL SALPINGO OOPHERECTOMY Bilateral 01/27/2015   Procedure: ROBOTIC ASSISTED TOTAL HYSTERECTOMY WITH BILATERAL SALPINGO OOPHORECTOMY AND SENTINAL NODE BIOPSY;  Surgeon: Everitt Amber, MD;  Location: [facility];  Service: Gynecology;  Laterality: Bilateral;   SHOULDER SURGERY Right    SKIN CANCER EXCISION     melanoma   TONSILLECTOMY AND ADENOIDECTOMY  1952   TRANSTHORACIC ECHOCARDIOGRAM  12/2005   EF 50-55%; mild MR; trace TR   TUBAL LIGATION  1978   VULVA /PERINEUM BIOPSY     PAPILLOMA    OB History     Gravida  1   Para  1   Term  1   Preterm      AB      Living  1      SAB      IAB      Ectopic      Multiple      Live Births               Home Medications    Prior to Admission medications   Medication Sig Start Date End Date Taking? Authorizing Provider  cephALEXin (KEFLEX) 500 MG capsule Take 1 capsule (500 mg total) by mouth 2 (two) times daily for 7 days. 07/21/22 07/28/22 Yes Melynda Ripple, NP  CALCIUM-VITAMIN D-VITAMIN K PO Take 1,200 mg by mouth daily.    [provider]  cholecalciferol (VITAMIN D) 1000 units tablet Take 1,000 Units by mouth daily.    [provider]  ezetimibe (ZETIA) 10 MG tablet TAKE 1 TABLET BY MOUTH EVERY DAY 07/18/22   Colon Branch, MD  hydrochlorothiazide (HYDRODIURIL) 12.5 MG tablet TAKE 1 TABLET BY MOUTH EVERY DAY 07/18/22   Colon Branch, MD  hydrocortisone 2.5 % cream Apply topically. 01/24/22   [provider]  ibandronate (BONIVA) 150 MG tablet Take 1 tablet (150 mg total) by mouth every 30 (thirty) days. Take in am with a full glass of water,  do not  lie down for the next 30 min. 03/19/22   Tamela Gammon, NP  lansoprazole (PREVACID) 15  MG capsule Take 15 mg by mouth daily at 12 noon.    [provider]  meloxicam (MOBIC) 15 MG tablet Take 1 tablet (15 mg total) by mouth daily as needed for pain. 03/04/20   Colon Branch, MD  metoprolol succinate (TOPROL-XL) 25 MG 24 hr tablet Take 0.5 tablets (12.5 mg total) by mouth daily. TAKE WITH OR IMMEDIATELY FOLLOWING A MEAL. 07/12/22   Colon Branch, MD  Multiple Vitamins-Minerals (PRESERVISION AREDS 2 PO) Take 2 capsules by mouth daily. Unknown strenght    [provider]  nystatin cream (  MYCOSTATIN) Apply 1 Application topically 2 (two) times daily. 12/13/21   Colon Branch, MD  Probiotic Product (PROBIOTIC-10 PO) Take by mouth.    [provider]  rosuvastatin (CRESTOR) 5 MG tablet TAKE 1 TABLET BY MOUTH 4 TIMES WEEKLY 02/19/22   Colon Branch, MD  telmisartan (MICARDIS) 40 MG tablet TAKE 1 TABLET BY MOUTH EVERY DAY 07/18/22   Colon Branch, MD  TIADYLT ER 240 MG 24 hr capsule TAKE 1 CAPSULE BY MOUTH EVERY DAY 05/16/22   Colon Branch, MD    Family History Family History  Problem Relation Age of Onset   Hypertension Mother    Thyroid disease Mother        M and B (?)   Hypertension Father    Stroke Father        hemorrhagic CVA at age 71   Bipolar disorder Brother    Diabetes Brother    Colon cancer Maternal Aunt        dx at age 54s   Breast cancer Neg Hx     Social History Social History   Tobacco Use   Smoking status: Former    Years: 10.00    Types: Cigarettes    Quit date: 05/20/1973    Years since quitting: 49.2   Smokeless tobacco: Never  Vaping Use   Vaping Use: Never used  Substance Use Topics   Alcohol use: Yes    Comment: occ   Drug use: No     Allergies   Dextromethorphan, Hydrocodone, Statins, Morphine and related, and Tape   Review of Systems Review of Systems  Genitourinary:  Positive for dysuria.     Physical Exam Triage Vital Signs ED Triage Vitals  Enc Vitals Group     BP 07/21/22 1215 (!) 147/72     Pulse Rate 07/21/22  1214 73     Resp 07/21/22 1214 18     Temp 07/21/22 1214 (!) 97.5 F (36.4 C)     Temp Source 07/21/22 1214 Oral     SpO2 07/21/22 1214 95 %     Weight --      Height --      Head Circumference --      Peak Flow --      Pain Score 07/21/22 1213 0     Pain Loc --      Pain Edu? --      Excl. in [place]? --    No data found.  Updated Vital Signs BP (!) 147/72   Pulse 73   Temp (!) 97.5 F (36.4 C) (Oral)   Resp 18   LMP 05/14/1997   SpO2 95%   Visual Acuity Right Eye Distance:   Left Eye Distance:   Bilateral Distance:    Right Eye Near:   Left Eye Near:    Bilateral Near:     Physical Exam Vitals and nursing note reviewed.  Constitutional:      Appearance: Normal appearance.  HENT:     Head: Normocephalic and atraumatic.  Eyes:     Pupils: Pupils are equal, round, and reactive to light.  Cardiovascular:     Rate and Rhythm: Normal rate.  Pulmonary:     Effort: Pulmonary effort is normal.  Abdominal:     Tenderness: There is no right CVA tenderness or left CVA tenderness.  Skin:    General: Skin is warm and dry.  Neurological:     General: No focal deficit present.  Mental Status: She is alert and oriented to person, place, and time.  Psychiatric:        Mood and Affect: Mood normal.        Behavior: Behavior normal.      UC Treatments / Results  Labs (all labs ordered are listed, but only abnormal results are displayed) Labs Reviewed  POCT URINALYSIS DIP (MANUAL ENTRY) - Abnormal; Notable for the following components:      Result Value   Color, UA colorless (*)    Leukocytes, UA Trace (*)    All other components within normal limits  URINE CULTURE    EKG   Radiology No results found.  Procedures Procedures (including critical care time)  Medications Ordered in UC Medications - No data to display  Initial Impression / Assessment and Plan / UC Course  I have reviewed the triage vital signs and the nursing notes.  Pertinent labs &  imaging results that were available during my care of the patient were reviewed by me and considered in my medical decision making (see chart for details).     UA positive for UTI, will culture and start Keflex Encourage fluids PCP follow-up if symptoms do not improve ER precautions reviewed Final Clinical Impressions(s) / UC Diagnoses   Final diagnoses:  Acute cystitis without hematuria     Discharge Instructions      The clinic will contact you with results of the urine culture done today Start Keflex twice daily for 7 days Increase fluids Follow-up with your PCP if symptoms do not improve Please go to the emergency room for any worsening symptoms   ED Prescriptions     Medication Sig Dispense Auth. Provider   cephALEXin (KEFLEX) 500 MG capsule Take 1 capsule (500 mg total) by mouth 2 (two) times daily for 7 days. 14 capsule Melynda Ripple, NP      PDMP not reviewed this encounter.   Melynda Ripple, NP 07/21/22 1242

## 2022-07-21 NOTE — ED Triage Notes (Signed)
Pt presents with c/o possible UTI. States she did an Azo test at home and states it was positive for leukocytes.

## 2022-07-23 ENCOUNTER — Encounter: Payer: Self-pay | Admitting: Neurology

## 2022-07-23 DIAGNOSIS — G4733 Obstructive sleep apnea (adult) (pediatric): Secondary | ICD-10-CM

## 2022-07-24 ENCOUNTER — Ambulatory Visit: Payer: PPO | Attending: Cardiology | Admitting: Cardiology

## 2022-07-24 ENCOUNTER — Encounter: Payer: Self-pay | Admitting: Cardiology

## 2022-07-24 VITALS — BP 128/62 | HR 70 | Ht 60.0 in | Wt 150.0 lb

## 2022-07-24 DIAGNOSIS — E785 Hyperlipidemia, unspecified: Secondary | ICD-10-CM

## 2022-07-24 DIAGNOSIS — I10 Essential (primary) hypertension: Secondary | ICD-10-CM | POA: Diagnosis not present

## 2022-07-24 DIAGNOSIS — I471 Supraventricular tachycardia, unspecified: Secondary | ICD-10-CM | POA: Diagnosis not present

## 2022-07-24 LAB — URINE CULTURE: Culture: 10000 — AB

## 2022-07-24 NOTE — Patient Instructions (Signed)

## 2022-07-24 NOTE — Progress Notes (Signed)
Cardiology Office Note:    Date:  07/24/2022   ID:  Emily Thompson, DOB [birth year], MRN [id number]  PCP:  Colon Branch, MD  Cardiologist:  Jenne Campus, MD    Referring MD: Colon Branch, MD   No chief complaint on file. Doing fine lab  History of Present Illness:    Emily Thompson is a 77 y.o. female with past medical history significant for supraventricular tachycardia, dyslipidemia she does have difficulty tolerating statin, extrasystole essential hypertension.  Comes today to months for follow-up.  Overall doing well denies have any chest pain tightness squeezing pressure burning chest.  She exercised on the regular basis with no difficulties.  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     GERD (gastroesophageal reflux disease)    History of breast cancer, left.  March 2003. 12/29/2010   HTN (hypertension) 03/14/2010   Qualifier: Diagnosis of  By: Larose Kells MD, [place]    Hyperlipidemia 03/14/2010   Qualifier: Diagnosis of  By: Larose Kells MD, [place]    Hypertension    Melanoma ([place]) 2012   righ side face; basal cell (nose)    Menopause 12/25/2010   Near syncope 10/26/2019   Osteopenia    OSTEOPENIA 03/14/2010   Qualifier: Diagnosis of  By: Larose Kells MD, [place]    Palpitations 05/20/2013   PCP NOTES >>>>>>>>>>>>>>>>>>>>>>>>>>>>>>>>>>>>> 07/03/2015   Personal history of colonic  polyps 07/12/2020   PONV (postoperative nausea and vomiting)    Post-menopausal atrophic vaginitis 12/25/2010   Postmenopausal bleeding 01/03/2015   Scoliosis    Sleep apnea    SVT (supraventricular tachycardia)    used to see Dr Rex Kras    Thickened endometrium 01/05/2015    Past Surgical History:  Procedure Laterality Date   ABDOMINAL HYSTERECTOMY  2016   BASAL CELL CARCINOMA EXCISION     BREAST LUMPECTOMY Left 07/2001   BREAST RECONSTRUCTION  2005   CESAREAN SECTION  1977   COLONOSCOPY W/ POLYPECTOMY  2013   NEXT DUE IN 5 YRS   NM MYOCAR PERF WALL MOTION  01/2006   bruce myoview; no evidence of inducible ischemia, anterior wall thinning without evidence of ischemia; post-stress EF 88%; low risk scan    NOSE SURGERY     basal cell carcinoma removed   R shoulder Arthroscopy and spur removal  02/15/20222   ROBOTIC ASSISTED TOTAL HYSTERECTOMY WITH BILATERAL SALPINGO OOPHERECTOMY Bilateral 01/27/2015   Procedure: ROBOTIC ASSISTED TOTAL HYSTERECTOMY WITH BILATERAL SALPINGO OOPHORECTOMY AND SENTINAL NODE BIOPSY;  Surgeon: Everitt Amber, MD;  Location: [facility];  Service: Gynecology;  Laterality: Bilateral;   SHOULDER SURGERY Right    SKIN CANCER EXCISION     melanoma   TONSILLECTOMY AND ADENOIDECTOMY  1952   TRANSTHORACIC ECHOCARDIOGRAM  12/2005   EF 50-55%; mild MR; trace TR   TUBAL LIGATION  1978   VULVA /PERINEUM BIOPSY  PAPILLOMA    Current Medications: Current Meds  Medication Sig   CALCIUM-VITAMIN D-VITAMIN K PO Take 1,200 mg by mouth daily.   cephALEXin (KEFLEX) 500 MG capsule Take 1 capsule (500 mg total) by mouth 2 (two) times daily for 7 days.   cholecalciferol (VITAMIN D) 1000 units tablet Take 1,000 Units by mouth daily.   ezetimibe (ZETIA) 10 MG tablet TAKE 1 TABLET BY MOUTH EVERY DAY   hydrochlorothiazide (HYDRODIURIL) 12.5 MG tablet TAKE 1 TABLET BY MOUTH EVERY DAY   hydrocortisone 2.5 % cream Apply topically.   ibandronate (BONIVA) 150 MG tablet Take 1 tablet  (150 mg total) by mouth every 30 (thirty) days. Take in am with a full glass of water,  do not  lie down for the next 30 min.   lansoprazole (PREVACID) 15 MG capsule Take 15 mg by mouth daily at 12 noon.   meloxicam (MOBIC) 15 MG tablet Take 1 tablet (15 mg total) by mouth daily as needed for pain.   metoprolol succinate (TOPROL-XL) 25 MG 24 hr tablet Take 0.5 tablets (12.5 mg total) by mouth daily. TAKE WITH OR IMMEDIATELY FOLLOWING A MEAL.   Multiple Vitamins-Minerals (PRESERVISION AREDS 2 PO) Take 2 capsules by mouth daily. Unknown strenght   nystatin cream (MYCOSTATIN) Apply 1 Application topically 2 (two) times daily.   Probiotic Product (PROBIOTIC-10 PO) Take by mouth.   rosuvastatin (CRESTOR) 5 MG tablet TAKE 1 TABLET BY MOUTH 4 TIMES WEEKLY   telmisartan (MICARDIS) 40 MG tablet TAKE 1 TABLET BY MOUTH EVERY DAY   TIADYLT ER 240 MG 24 hr capsule TAKE 1 CAPSULE BY MOUTH EVERY DAY     Allergies:   Dextromethorphan, Hydrocodone, Statins, Morphine and related, and Tape   Social History   Socioeconomic History   Marital status: Widowed    Spouse name: Not on file   Number of children: 2   Years of education: BSN   Highest education level: Not on file  Occupational History   Occupation: Marine scientist, 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)    Employer: RETIRED  Tobacco Use   Smoking status: Former    Years: 10.00    Types: Cigarettes    Quit date: 05/20/1973    Years since quitting: 49.2   Smokeless tobacco: Never  Vaping Use   Vaping Use: Never used  Substance and Sexual Activity   Alcohol use: Yes    Comment: occ   Drug use: No   Sexual activity: Not Currently    Birth control/protection: Post-menopausal    Comment: First IC >59 y/o, Partners <5  Other Topics Concern   Not on file  Social History Narrative   Widow, 1 adopted and 1 natural child; ([place], [place])   Live by herself   Lost her long-term companion Jenny Reichmann [place]  09/11/2021   Social Determinants of Health   Financial  Resource Strain: Not on file  Food Insecurity: Not on file  Transportation Needs: Not on file  Physical Activity: Not on file  Stress: Not on file  Social Connections: Not on file     Family History: The patient's family history includes Bipolar disorder in her brother; Colon cancer in her maternal aunt; Diabetes in her brother; Hypertension in her father and mother; Stroke in her father; Thyroid disease in her mother. There is no history of Breast cancer. ROS:   Please see the history of present illness.    All 14 point review of systems negative except as described per history of present  illness  EKGs/Labs/Other Studies Reviewed:      Recent Labs: 12/13/2021: ALT 15; Hemoglobin 13.7; Magnesium 2.0; Platelets 269.0; TSH 1.76 06/06/2022: BUN 20; Creatinine, Ser 0.69; Potassium 4.3; Sodium 139  Recent Lipid Panel    Component Value Date/Time   CHOL 189 06/06/2022 1053   TRIG 67.0 06/06/2022 1053   HDL 74.40 06/06/2022 1053   CHOLHDL 3 06/06/2022 1053   VLDL 13.4 06/06/2022 1053   LDLCALC 101 (H) 06/06/2022 1053   LDLCALC 114 (H) 05/17/2020 0752   LDLDIRECT 114.7 07/28/2012 0823    Physical Exam:    VS:  BP 128/62 (BP Location: Left Arm, Patient Position: Sitting, Cuff Size: Normal)   Pulse 70   Ht 5' (1.524 m)   Wt 150 lb (68 kg)   LMP 05/14/1997   SpO2 96%   BMI 29.29 kg/m     Wt Readings from Last 3 Encounters:  07/24/22 150 lb (68 kg)  07/10/22 148 lb (67.1 kg)  06/06/22 148 lb 8 oz (67.4 kg)     GEN:  Well nourished, well developed in no acute distress HEENT: Normal NECK: No JVD; No carotid bruits LYMPHATICS: No lymphadenopathy CARDIAC: RRR, no murmurs, no rubs, no gallops RESPIRATORY:  Clear to auscultation without rales, wheezing or rhonchi  ABDOMEN: Soft, non-tender, non-distended MUSCULOSKELETAL:  No edema; No deformity  SKIN: Warm and dry LOWER EXTREMITIES: no swelling NEUROLOGIC:  Alert and oriented x 3 PSYCHIATRIC:  Normal affect   ASSESSMENT:     1. Primary hypertension   2. SVT (supraventricular tachycardia)   3. Dyslipidemia    PLAN:    In order of problems listed above:  Essential hypertension blood pressure well-controlled continue present management. Dyslipidemia I did review her K PN LDL 101 HDL of 74 this is from January.  She is taking Crestor 5 which I will continue. Supraventricular tachycardia interestingly she was not able to tolerate 25 mg metoprolol now she takes only 12.5 which seems to be controlling her arrhythmia perfectly.  Will continue present management.     Medication Adjustments/Labs and Tests Ordered: Current medicines are reviewed at length with the patient today.  Concerns regarding medicines are outlined above.  No orders of the defined types were placed in this encounter.  Medication changes: No orders of the defined types were placed in this encounter.   Signed, Park Liter, MD, [facility] 07/24/2022 2:32 PM    [place]

## 2022-07-25 DIAGNOSIS — G4733 Obstructive sleep apnea (adult) (pediatric): Secondary | ICD-10-CM | POA: Diagnosis not present

## 2022-07-30 ENCOUNTER — Ambulatory Visit (INDEPENDENT_AMBULATORY_CARE_PROVIDER_SITE_OTHER): Payer: PPO | Admitting: Internal Medicine

## 2022-07-30 ENCOUNTER — Encounter: Payer: Self-pay | Admitting: Internal Medicine

## 2022-07-30 VITALS — BP 126/80 | HR 76 | Temp 97.8°F | Resp 16 | Ht 60.0 in | Wt 152.5 lb

## 2022-07-30 DIAGNOSIS — N390 Urinary tract infection, site not specified: Secondary | ICD-10-CM

## 2022-07-30 DIAGNOSIS — N3000 Acute cystitis without hematuria: Secondary | ICD-10-CM

## 2022-07-30 MED ORDER — SULFAMETHOXAZOLE-TRIMETHOPRIM 800-160 MG PO TABS: 1.0000 | ORAL_TABLET | Freq: Two times a day (BID) | ORAL | 0 refills | Status: DC

## 2022-07-30 MED ORDER — NYSTATIN 100000 UNIT/GM EX CREA: 1.0000 | TOPICAL_CREAM | Freq: Two times a day (BID) | CUTANEOUS | 0 refills | Status: DC

## 2022-07-30 NOTE — Progress Notes (Signed)
Subjective:    Patient ID: Emily Thompson, female    DOB: October 26, 1945, 77 y.o.   MRN: [id number]  DOS:  07/30/2022 Type of visit - description: Urgent care follow-up  Had a UTI earlier this year and again about a week ago. She woke up with dysuria, urinary pressure, frequency some urgency. Within hours went to urgent care and started taking an antibiotic. Symptoms have subsided but not completely well, still have some frequency.  No fever or chills No nausea vomiting No vaginal discharge or bleeding No sexual activity recently.  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     GERD (gastroesophageal reflux disease)    History of breast cancer, left.  March 2003. 12/29/2010   HTN (hypertension) 03/14/2010   Qualifier: Diagnosis of  By: Larose Kells MD, [place]    Hyperlipidemia 03/14/2010   Qualifier: Diagnosis of  By: Larose Kells MD, [place]    Hypertension    Melanoma ([place]) 2012   righ side face; basal cell (nose)    Menopause 12/25/2010   Near syncope 10/26/2019   Osteopenia    OSTEOPENIA 03/14/2010   Qualifier: Diagnosis of  By: Larose Kells MD, [place]    Palpitations 05/20/2013   PCP NOTES >>>>>>>>>>>>>>>>>>>>>>>>>>>>>>>>>>>>> 07/03/2015   Personal history of colonic polyps 07/12/2020   PONV (postoperative nausea and vomiting)    Post-menopausal atrophic  vaginitis 12/25/2010   Postmenopausal bleeding 01/03/2015   Scoliosis    Sleep apnea    SVT (supraventricular tachycardia)    used to see Dr Rex Kras    Thickened endometrium 01/05/2015    Past Surgical History:  Procedure Laterality Date   ABDOMINAL HYSTERECTOMY  2016   BASAL CELL CARCINOMA EXCISION     BREAST LUMPECTOMY Left 07/2001   BREAST RECONSTRUCTION  2005   CESAREAN SECTION  1977   COLONOSCOPY W/ POLYPECTOMY  2013   NEXT DUE IN 5 YRS   NM MYOCAR PERF WALL MOTION  01/2006   bruce myoview; no evidence of inducible ischemia, anterior wall thinning without evidence of ischemia; post-stress EF 88%; low risk scan    NOSE SURGERY     basal cell carcinoma removed   R shoulder Arthroscopy and spur removal  02/15/20222   ROBOTIC ASSISTED TOTAL HYSTERECTOMY WITH BILATERAL SALPINGO OOPHERECTOMY Bilateral 01/27/2015   Procedure: ROBOTIC ASSISTED TOTAL HYSTERECTOMY WITH BILATERAL SALPINGO OOPHORECTOMY AND SENTINAL NODE BIOPSY;  Surgeon: Everitt Amber, MD;  Location: [facility];  Service: Gynecology;  Laterality: Bilateral;   SHOULDER SURGERY Right    SKIN CANCER EXCISION     melanoma   TONSILLECTOMY AND ADENOIDECTOMY  1952   TRANSTHORACIC ECHOCARDIOGRAM  12/2005   EF 50-55%; mild MR; trace TR   TUBAL LIGATION  1978   VULVA /PERINEUM BIOPSY     PAPILLOMA    Current Outpatient Medications  Medication Instructions  CALCIUM-VITAMIN D-VITAMIN K PO 1,200 mg, Oral, Daily   cholecalciferol (VITAMIN D) 1,000 Units, Oral, Daily   ezetimibe (ZETIA) 10 mg, Oral, Daily   hydrochlorothiazide (HYDRODIURIL) 12.5 mg, Oral, Daily   hydrocortisone 2.5 % cream Topical   ibandronate (BONIVA) 150 mg, Oral, Every 30 days, Take in am with a full glass of water,  do not  lie down for the next 30 min.   lansoprazole (PREVACID) 15 mg, Oral, Daily   meloxicam (MOBIC) 15 mg, Oral, Daily PRN   metoprolol succinate (TOPROL-XL) 12.5 mg, Oral, Daily, TAKE WITH OR IMMEDIATELY FOLLOWING A MEAL.   Multiple  Vitamins-Minerals (PRESERVISION AREDS 2 PO) 2 capsules, Oral, Daily, Unknown strenght   nystatin cream (MYCOSTATIN) 1 Application, Topical, 2 times daily   Probiotic Product (PROBIOTIC-10 PO) Oral   rosuvastatin (CRESTOR) 5 MG tablet TAKE 1 TABLET BY MOUTH 4 TIMES WEEKLY   sulfamethoxazole-trimethoprim (BACTRIM DS) 800-160 MG tablet 1 tablet, Oral, 2 times daily   telmisartan (MICARDIS) 40 MG tablet TAKE 1 TABLET BY MOUTH EVERY DAY   TIADYLT ER 240 MG 24 hr capsule TAKE 1 CAPSULE BY MOUTH EVERY DAY       Objective:   Physical Exam BP 126/80   Pulse 76   Temp 97.8 F (36.6 C) (Oral)   Resp 16   Ht 5' (1.524 m)   Wt 152 lb 8 oz (69.2 kg)   LMP 05/14/1997   SpO2 98%   BMI 29.78 kg/m  General:   Well developed, NAD, BMI noted.  HEENT:  Normocephalic . Face symmetric, atraumatic Abdomen:  Not distended, soft, non-tender. No rebound or rigidity. No CVA tenderness Skin: Not pale. Not jaundice Lower extremities: no pretibial edema bilaterally  Neurologic:  alert & oriented X3.  Speech normal, gait appropriate for age and unassisted Psych--  Cognition and judgment appear intact.  Cooperative with normal attention span and concentration.  Behavior appropriate. No anxious or depressed appearing.     Assessment    Assessment HTN Hyperlipidemia   GERD Osteopenia: Per gynecology.  dexa 2016 (per gyn); rx boniva 05-2017  Insomnia  MSK: ---DJD,  NSAIDs prn --Back pain, --Dr Tommie Raymond ~ 2011, saw the spine center ~ 2015 , dx w/  spinal stenosis (no MRI, clinical dx ) --2017-2018: PT, injections Dr Catha Brow Cough-- hycodan prn (tolerates well small doses hydrocodone) Oncology: --Breast cancer 2003 --Endometrial cancer, H-BSO 01/2015, will see hematology x 5 years (gyn-onc) --Melanoma, BCC (Dr Delman Cheadle) Lung nodule, 64mm, per CTs, last CT 09/30/2015 : stable  H/o SVT  HOH OSA: Severe, Dx 2023. On Cpap   PLAN: UTI. Developed LUTS about a week ago, immediately went to  urgent care, was prescribed cephalexin, in the meantime urine culture showed K. Ascorbata,  10,000 colonies.  Symptoms are better but she is not 100% asymptomatic. She would like to be retested to be sure she is completely cured. Plan: Because she  still has sxs will rx a  second round of antibiotic, based on culture we will prescribe Bactrim. Drink plenty of fluids, probiotics. RTC in 3 weeks and provide a new urine sample Consider reach out to gynecology.  Topical estrogen cream?.

## 2022-07-30 NOTE — Patient Instructions (Signed)
Take the new antibiotic called Bactrim for 4 days.  Drink plenty of fluids  Continue probiotics  Come back in 3 weeks and provide a urine sample.  Please make an appointment  If you are not completely well let us know.  Recommend to see your gynecologist at your convenience

## 2022-07-31 ENCOUNTER — Ambulatory Visit: Payer: PPO | Admitting: Internal Medicine

## 2022-07-31 ENCOUNTER — Ambulatory Visit: Payer: Self-pay | Admitting: Licensed Clinical Social Worker

## 2022-07-31 NOTE — Patient Outreach (Signed)
  Care Coordination  Initial Visit Note   07/31/2022 Name: Emily Thompson MRN: [id number] DOB: 11/12/1945  Emily Thompson is a 77 y.o. year old female who sees Larose Kells, Alda Berthold, MD for primary care. I spoke with  [name] by phone today.  What matters to the patients health and wellness today?  Patient reports no concerns or needs from Care Coordination team with health and wellness related to physical or mental heath. .   SDOH assessments and interventions completed:  Yes  SDOH Interventions Today    Flowsheet Row Most Recent Value  SDOH Interventions   Food Insecurity Interventions Intervention Not Indicated  Housing Interventions Intervention Not Indicated  Transportation Interventions Intervention Not Indicated  Utilities Interventions Intervention Not Indicated  Financial Strain Interventions Intervention Not Indicated       Care Coordination Interventions:  Yes, provided  Interventions Today    Flowsheet Row Most Recent Value  Chronic Disease   Chronic disease during today's visit Hypertension (HTN)  General Interventions   General Interventions Discussed/Reviewed General Interventions Discussed  [facility] Care Coordination Program]  Exercise Interventions   Exercise Discussed/Reviewed Exercise Discussed  [patient reports she is very active]       Follow up plan: No further intervention required.   Encounter Outcome:  Pt. Visit Completed   Casimer Thompson, [place] [phone]

## 2022-07-31 NOTE — Assessment & Plan Note (Signed)
UTI. Developed LUTS about a week ago, immediately went to urgent care, was prescribed cephalexin, in the meantime urine culture showed K. Ascorbata,  10,000 colonies.  Symptoms are better but she is not 100% asymptomatic. She would like to be retested to be sure she is completely cured. Plan: Because she  still has sxs will rx a  second round of antibiotic, based on culture we will prescribe Bactrim. Drink plenty of fluids, probiotics. RTC in 3 weeks and provide a new urine sample Consider reach out to gynecology.  Topical estrogen cream?.

## 2022-07-31 NOTE — Patient Instructions (Signed)
Visit Information  Thank you for taking time to visit with me today. Please don't hesitate to contact me if I can be of assistance to you.    Care Coordination provides support specific to your health needs that extend beyond exceptional routine office care you already receive from your primary care doctor.    If you are eligible for standard Care Coordination, there is no cost to you.  The Care Coordination team is made up of the following team members: Registered Nurse Care Guide: disease management, health education, care coordination and complex case management Clinical Social Work: Complex Care Coordination including coordination of level of care needs, mental and behavioral health assessment and recommendations, and connection to long-term mental health support Clinical Pharmacist: medication management, assistance and disease management Community Resource Care Guides: community resource connections Scheduling Team: dedicated team of scheduling professionals to support patient and clinical team scheduling needs  Please call [phone] if you would like to schedule a phone appointment with one of the team members.    Patient verbalizes understanding of instructions and care plan provided today and agrees to view in MyChart. Active MyChart status and patient understanding of how to access instructions and care plan via MyChart confirmed with patient.     No further follow up required: By Care Coordination   Cloud Graham, LCSW Social Work Care Coordination  [place] [facility] [phone]     

## 2022-08-06 DIAGNOSIS — M47816 Spondylosis without myelopathy or radiculopathy, lumbar region: Secondary | ICD-10-CM | POA: Diagnosis not present

## 2022-08-07 DIAGNOSIS — G4733 Obstructive sleep apnea (adult) (pediatric): Secondary | ICD-10-CM | POA: Diagnosis not present

## 2022-08-09 ENCOUNTER — Ambulatory Visit (INDEPENDENT_AMBULATORY_CARE_PROVIDER_SITE_OTHER): Payer: PPO | Admitting: Nurse Practitioner

## 2022-08-09 ENCOUNTER — Encounter: Payer: Self-pay | Admitting: Internal Medicine

## 2022-08-09 ENCOUNTER — Encounter: Payer: Self-pay | Admitting: Nurse Practitioner

## 2022-08-09 VITALS — BP 124/80 | HR 60

## 2022-08-09 DIAGNOSIS — N390 Urinary tract infection, site not specified: Secondary | ICD-10-CM

## 2022-08-09 DIAGNOSIS — R3915 Urgency of urination: Secondary | ICD-10-CM

## 2022-08-09 DIAGNOSIS — N958 Other specified menopausal and perimenopausal disorders: Secondary | ICD-10-CM

## 2022-08-09 DIAGNOSIS — N952 Postmenopausal atrophic vaginitis: Secondary | ICD-10-CM | POA: Diagnosis not present

## 2022-08-09 LAB — URINALYSIS, COMPLETE W/RFL CULTURE
Bacteria, UA: NONE SEEN /HPF
Bilirubin Urine: NEGATIVE
Casts: NONE SEEN /LPF
Crystals: NONE SEEN /HPF
Glucose, UA: NEGATIVE
Hgb urine dipstick: NEGATIVE
Hyaline Cast: NONE SEEN /LPF
Leukocyte Esterase: NEGATIVE
Nitrites, Initial: NEGATIVE
Protein, ur: NEGATIVE
RBC / HPF: NONE SEEN /HPF (ref 0–2)
Specific Gravity, Urine: 1.02 (ref 1.001–1.035)
WBC, UA: NONE SEEN /HPF (ref 0–5)
Yeast: NONE SEEN /HPF
pH: 5.5 (ref 5.0–8.0)

## 2022-08-09 LAB — NO CULTURE INDICATED

## 2022-08-09 MED ORDER — ESTRADIOL 0.1 MG/GM VA CREA: 1.0000 g | TOPICAL_CREAM | VAGINAL | 0 refills | Status: DC

## 2022-08-09 NOTE — Progress Notes (Signed)
   Acute Office Visit  Subjective:    Patient ID: Emily Thompson, female    DOB: 02/28/1946, 77 y.o.   MRN: [id number]   HPI 77 y.o. presents today to discuss vaginal estrogen for UTI prevention and UTI-like symptoms. + urine culture 05/20/2022 and 07/21/22. Still feels some urgency since last UTI. Feels she had recurrent UTI symptoms prior to infections that are intermittent. Has follow up UA scheduled with PCP 4/5 but wants checked today. PCP mentioned vaginal estrogen and she is wondering if she is a candidate. H/O breast cancer in 2003. Started women's health probiotic. Was prescribed Vagifem in 2012 with oncologist approval. Does not recall if or how long she used it.    Review of Systems  Constitutional: Negative.   Genitourinary:  Positive for urgency and vaginal pain. Negative for difficulty urinating, dysuria, flank pain, frequency, genital sores, hematuria, pelvic pain and vaginal discharge.       Objective:    Physical Exam Constitutional:      Appearance: Normal appearance.  Genitourinary:    General: Normal vulva.     Comments: Atrophic changes    BP 124/80   Pulse 60   LMP 05/14/1997   SpO2 97%  Wt Readings from Last 3 Encounters:  07/30/22 152 lb 8 oz (69.2 kg)  07/24/22 150 lb (68 kg)  07/10/22 148 lb (67.1 kg)        Patient informed chaperone available to be present for breast and/or pelvic exam. Patient has requested no chaperone to be present. Patient has been advised what will be completed during breast and pelvic exam.   UA negative  Assessment & Plan:   Problem List Items Addressed This Visit   None Visit Diagnoses     Genitourinary syndrome of menopause    -  Primary   Relevant Medications   estradiol (ESTRACE VAGINAL) 0.1 MG/GM vaginal cream   Recurrent UTI       Relevant Orders   Urinalysis,Complete w/RFL Culture   Vaginal atrophy       Relevant Medications   estradiol (ESTRACE VAGINAL) 0.1 MG/GM vaginal cream   Urinary urgency        Relevant Orders   Urinalysis,Complete w/RFL Culture      Plan: UA negative. Atrophic changes present. Will try vaginal estrogen twice weekly externally only for GSM and recurrent UTIs. Aware of risk for small amount of systemic absorption.      Tamela Gammon DNP, 11:48 AM 08/09/2022

## 2022-08-16 DIAGNOSIS — M47816 Spondylosis without myelopathy or radiculopathy, lumbar region: Secondary | ICD-10-CM | POA: Diagnosis not present

## 2022-08-17 ENCOUNTER — Ambulatory Visit: Payer: PPO | Admitting: Internal Medicine

## 2022-08-25 DIAGNOSIS — G4733 Obstructive sleep apnea (adult) (pediatric): Secondary | ICD-10-CM | POA: Diagnosis not present

## 2022-08-30 DIAGNOSIS — M47816 Spondylosis without myelopathy or radiculopathy, lumbar region: Secondary | ICD-10-CM | POA: Diagnosis not present

## 2022-09-04 ENCOUNTER — Ambulatory Visit: Payer: PPO | Admitting: Nurse Practitioner

## 2022-09-11 DIAGNOSIS — M47816 Spondylosis without myelopathy or radiculopathy, lumbar region: Secondary | ICD-10-CM | POA: Diagnosis not present

## 2022-09-14 ENCOUNTER — Encounter: Payer: Self-pay | Admitting: Internal Medicine

## 2022-09-24 DIAGNOSIS — G4733 Obstructive sleep apnea (adult) (pediatric): Secondary | ICD-10-CM | POA: Diagnosis not present

## 2022-09-27 DIAGNOSIS — M47816 Spondylosis without myelopathy or radiculopathy, lumbar region: Secondary | ICD-10-CM | POA: Diagnosis not present

## 2022-10-19 DIAGNOSIS — C50912 Malignant neoplasm of unspecified site of left female breast: Secondary | ICD-10-CM | POA: Diagnosis not present

## 2022-10-25 DIAGNOSIS — G4733 Obstructive sleep apnea (adult) (pediatric): Secondary | ICD-10-CM | POA: Diagnosis not present

## 2022-10-29 DIAGNOSIS — M47816 Spondylosis without myelopathy or radiculopathy, lumbar region: Secondary | ICD-10-CM | POA: Diagnosis not present

## 2022-10-29 DIAGNOSIS — C50912 Malignant neoplasm of unspecified site of left female breast: Secondary | ICD-10-CM | POA: Diagnosis not present

## 2022-10-30 IMAGING — MR MR SHOULDER*R* W/O CM
4 of 5 series · 27 of 40 positions shown · non-contrast
Comparison: None.

CLINICAL DATA: Right shoulder pain and limited range of motion for
several years.

EXAM:
MRI OF THE RIGHT SHOULDER WITHOUT CONTRAST
TECHNIQUE: Multiplanar, multisequence MR imaging of the shoulder was performed.
No intravenous contrast was administered.

[Series 6: T2 fat-sat · axial · right · 3.0mm · 0.62mm/px · z∈[-12,+88]mm · 8 of 27 slices shown (1 of 3)]
[im 1/27]
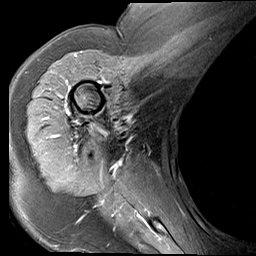
[im 4/27]
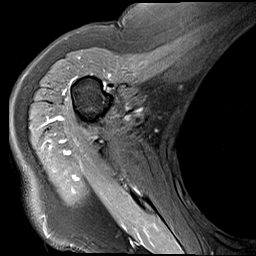
[im 8/27]
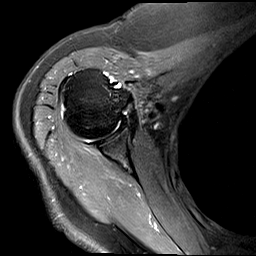
[im 12/27]
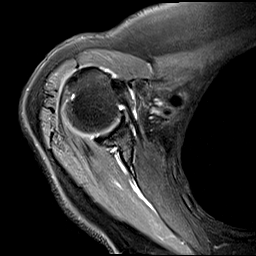
[im 15/27]
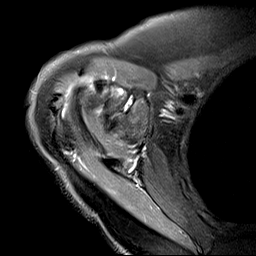
[im 19/27]
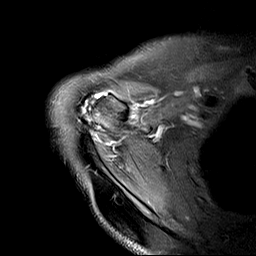
[im 23/27]
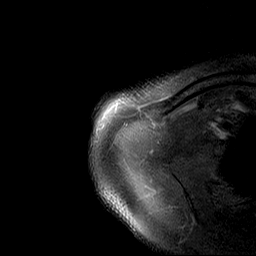
[im 27/27]
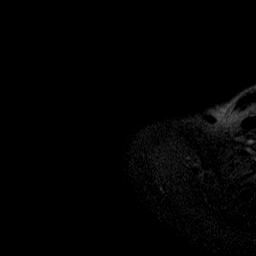

[Series 7: T2 fat-sat · oblique · right · 4.0mm · 0.29mm/px · 8 of 24 slices shown (2 of 3)]
[im 1/24]
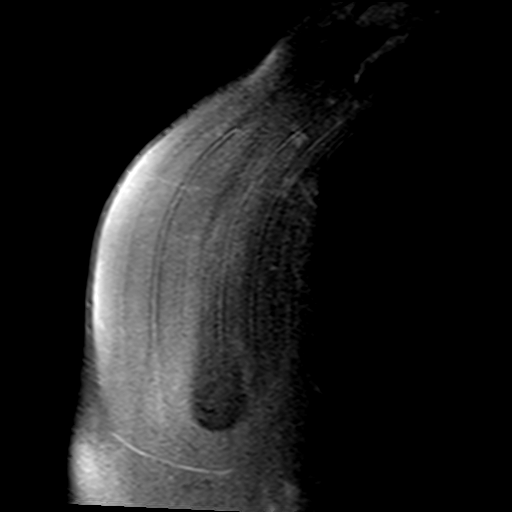
[im 4/24]
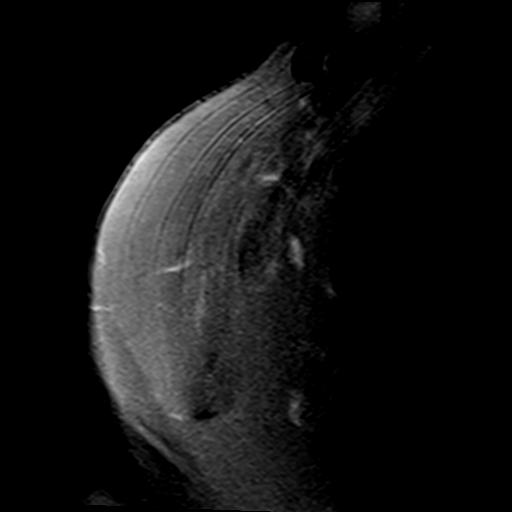
[im 7/24]
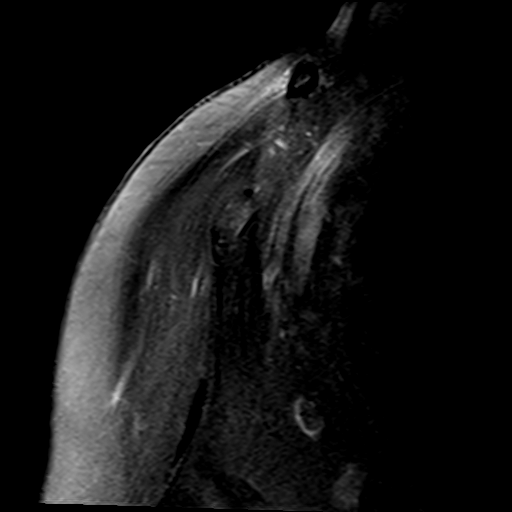
[im 10/24]
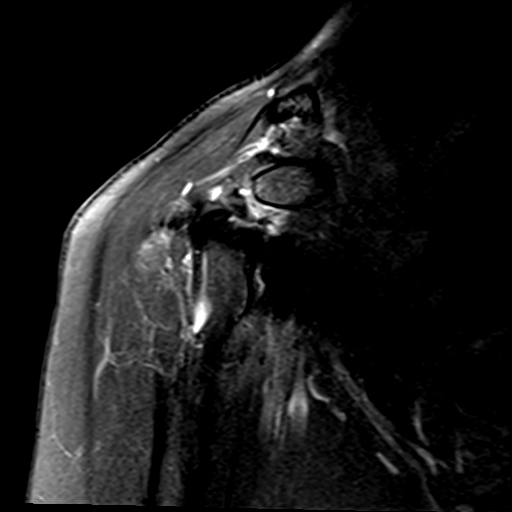
[im 14/24]
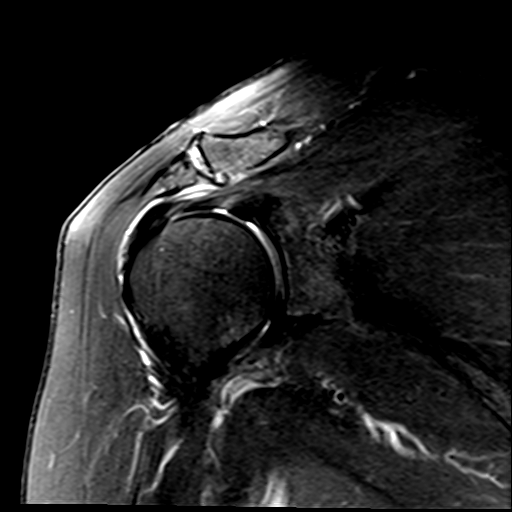
[im 17/24]
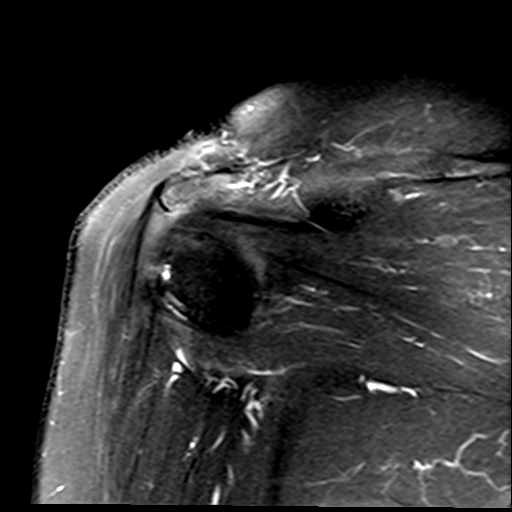
[im 20/24]
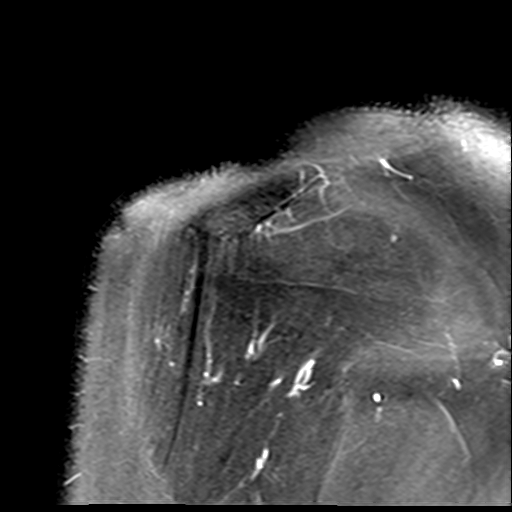
[im 24/24]
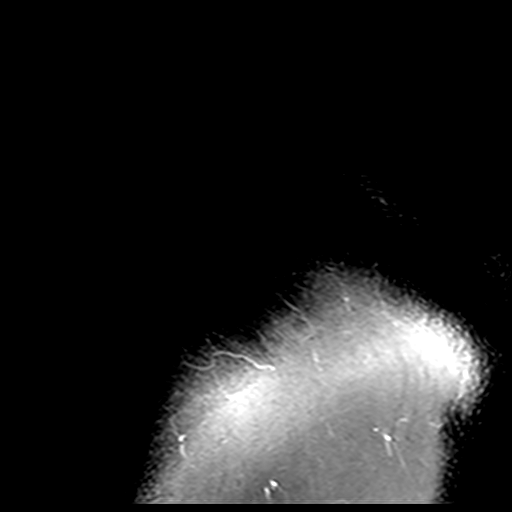

[Series 8: PD · oblique · right · 4.0mm · 0.29mm/px · 8 of 24 slices shown]
[im 1/24]
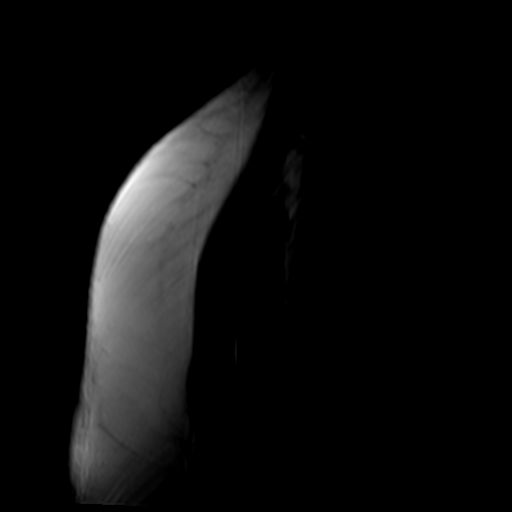
[im 4/24]
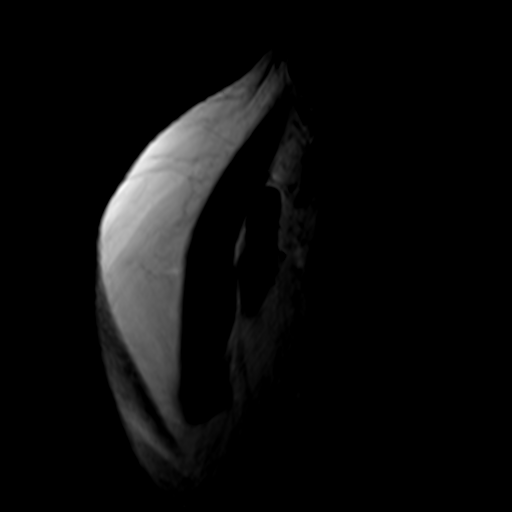
[im 7/24]
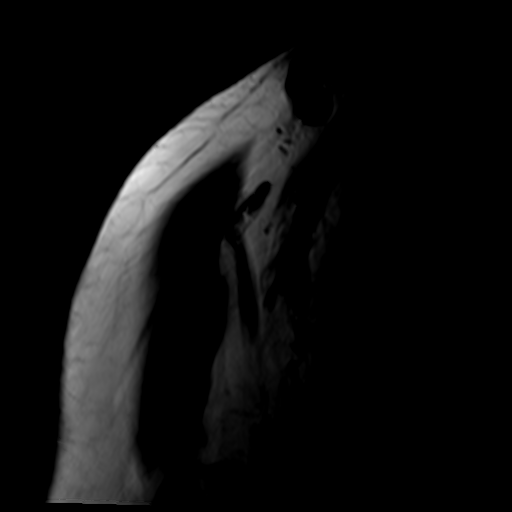
[im 10/24]
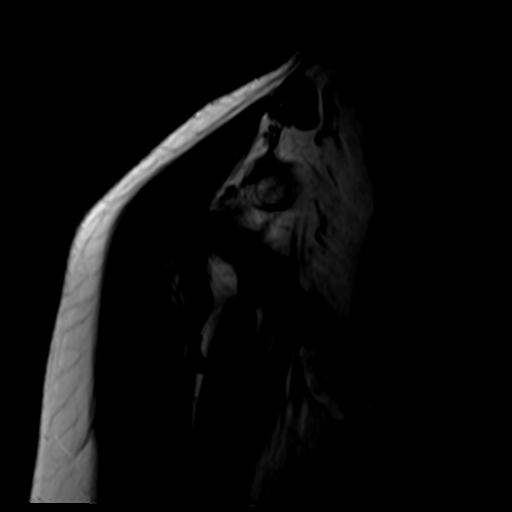
[im 14/24]
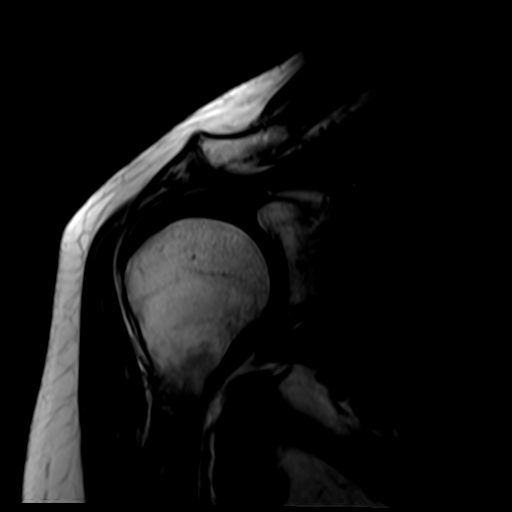
[im 17/24]
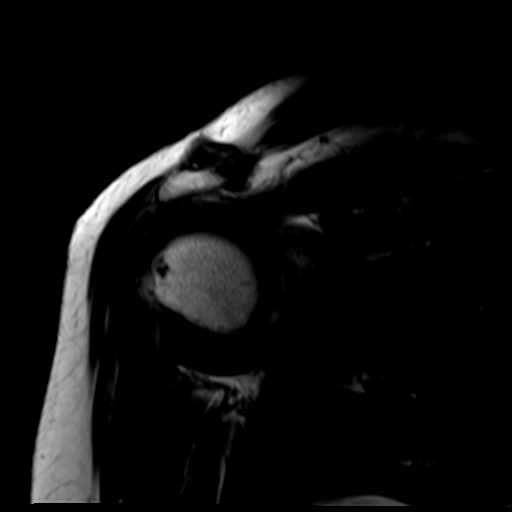
[im 20/24]
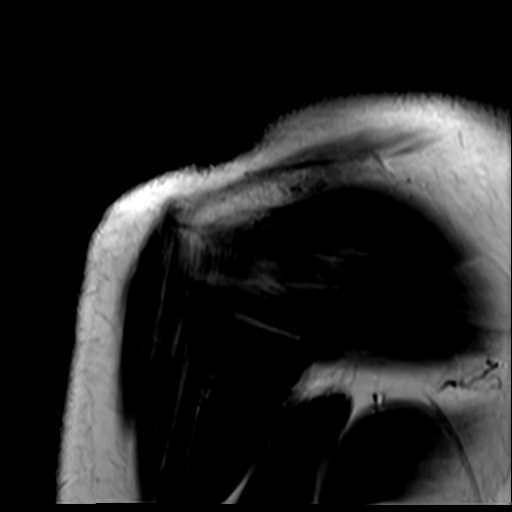
[im 24/24]
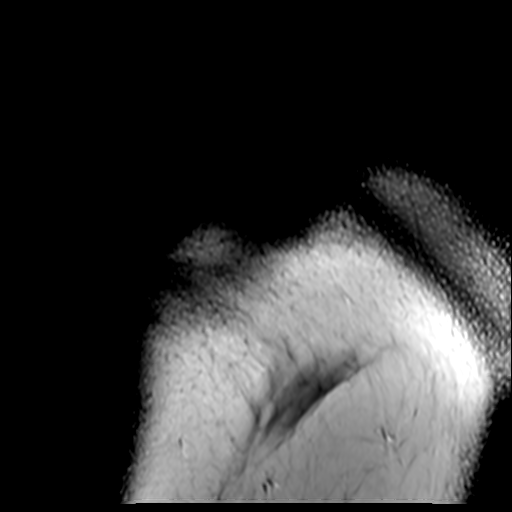

[Series 9: T2 fat-sat · coronal · right · 4.0mm · 0.59mm/px · 3 of 25 slices shown (3 of 3)]
[im 4/25]
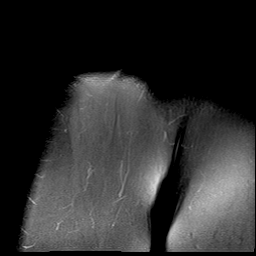
[im 14/25]
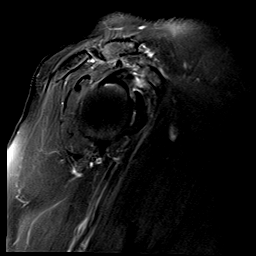
[im 21/25]
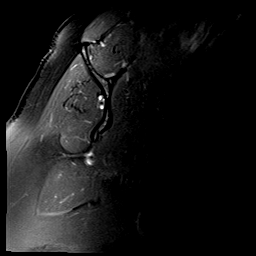

[27 of 40 positions shown; findings below may reference images not displayed]

FINDINGS: Rotator cuff: Moderate rotator cuff tendinopathy/tendinosis with
interstitial tears and areas of bursal and articular surface fraying
and fibrillation. No full thickness retracted tear is identified.

Muscles:  Unremarkable.

Biceps long head:  Intact

Acromioclavicular Joint: Moderate degenerative changes. Type 2
acromion. Moderate lateral downsloping without definite subacromial
spurring.

Glenohumeral Joint: Mild degenerative changes. No joint effusion or
synovitis.

Labrum:  No definite labral tears.

Bones:  No acute bony findings.

Other: Mild to moderate subacromial/subdeltoid bursitis.
IMPRESSION: 1. Moderate rotator cuff tendinopathy/tendinosis with interstitial
tears and areas of bursal and articular surface fraying and
fibrillation. No full thickness retracted tear.
2. Intact long head biceps tendon and glenoid labrum.
3. Moderate lateral downsloping of a type 2 acromion.
4. Mild to moderate subacromial/subdeltoid bursitis.

## 2022-11-06 DIAGNOSIS — H16143 Punctate keratitis, bilateral: Secondary | ICD-10-CM | POA: Diagnosis not present

## 2022-11-07 DIAGNOSIS — G4733 Obstructive sleep apnea (adult) (pediatric): Secondary | ICD-10-CM | POA: Diagnosis not present

## 2022-11-08 DIAGNOSIS — C50912 Malignant neoplasm of unspecified site of left female breast: Secondary | ICD-10-CM | POA: Diagnosis not present

## 2022-11-09 ENCOUNTER — Other Ambulatory Visit: Payer: Self-pay | Admitting: Internal Medicine

## 2022-11-24 DIAGNOSIS — G4733 Obstructive sleep apnea (adult) (pediatric): Secondary | ICD-10-CM | POA: Diagnosis not present

## 2022-12-05 ENCOUNTER — Ambulatory Visit (INDEPENDENT_AMBULATORY_CARE_PROVIDER_SITE_OTHER): Payer: PPO | Admitting: Internal Medicine

## 2022-12-05 ENCOUNTER — Encounter: Payer: Self-pay | Admitting: Internal Medicine

## 2022-12-05 VITALS — BP 122/68 | HR 65 | Temp 98.0°F | Resp 16 | Ht 60.0 in | Wt 150.4 lb

## 2022-12-05 DIAGNOSIS — I10 Essential (primary) hypertension: Secondary | ICD-10-CM | POA: Diagnosis not present

## 2022-12-05 DIAGNOSIS — G4734 Idiopathic sleep related nonobstructive alveolar hypoventilation: Secondary | ICD-10-CM | POA: Diagnosis not present

## 2022-12-05 DIAGNOSIS — E782 Mixed hyperlipidemia: Secondary | ICD-10-CM

## 2022-12-05 DIAGNOSIS — G4733 Obstructive sleep apnea (adult) (pediatric): Secondary | ICD-10-CM | POA: Diagnosis not present

## 2022-12-05 DIAGNOSIS — M8580 Other specified disorders of bone density and structure, unspecified site: Secondary | ICD-10-CM

## 2022-12-05 LAB — CBC WITH DIFFERENTIAL/PLATELET
Basophils Absolute: 0.1 10*3/uL (ref 0.0–0.1)
Basophils Relative: 1 % (ref 0.0–3.0)
Eosinophils Absolute: 0.3 10*3/uL (ref 0.0–0.7)
Eosinophils Relative: 4.2 % (ref 0.0–5.0)
HCT: 41.8 % (ref 36.0–46.0)
Hemoglobin: 13.3 g/dL (ref 12.0–15.0)
Lymphocytes Relative: 12.2 % (ref 12.0–46.0)
Lymphs Abs: 1 10*3/uL (ref 0.7–4.0)
MCHC: 31.8 g/dL (ref 30.0–36.0)
MCV: 92.2 fl (ref 78.0–100.0)
Monocytes Absolute: 0.9 10*3/uL (ref 0.1–1.0)
Monocytes Relative: 11.5 % (ref 3.0–12.0)
Neutro Abs: 5.6 10*3/uL (ref 1.4–7.7)
Neutrophils Relative %: 71.1 % (ref 43.0–77.0)
Platelets: 262 10*3/uL (ref 150.0–400.0)
RBC: 4.53 Mil/uL (ref 3.87–5.11)
RDW: 14.2 % (ref 11.5–15.5)
WBC: 7.8 10*3/uL (ref 4.0–10.5)

## 2022-12-05 LAB — BASIC METABOLIC PANEL
BUN: 17 mg/dL (ref 6–23)
CO2: 30 mEq/L (ref 19–32)
Calcium: 9.8 mg/dL (ref 8.4–10.5)
Chloride: 104 mEq/L (ref 96–112)
Creatinine, Ser: 0.75 mg/dL (ref 0.40–1.20)
GFR: 75.95 mL/min (ref >60.00)
Glucose, Bld: 84 mg/dL (ref 70–99)
Potassium: 4.5 mEq/L (ref 3.5–5.1)
Sodium: 142 mEq/L (ref 135–145)

## 2022-12-05 LAB — AST: AST: 21 U/L (ref 0–37)

## 2022-12-05 LAB — ALT: ALT: 16 U/L (ref 0–35)

## 2022-12-05 MED ORDER — HYDROCODONE BIT-HOMATROP MBR 5-1.5 MG/5ML PO SOLN: 5.0000 mL | Freq: Every evening | ORAL | 0 refills | Status: DC | PRN

## 2022-12-05 NOTE — Patient Instructions (Addendum)
Vaccines I recommend:  Flu shot this fall  Take Hycodan as needed at night Check the  blood pressure regularly BP GOAL is between 110/65 and  135/85. If it is consistently higher or lower, let me know    GO TO THE LAB : Get the blood work     GO TO THE FRONT DESK, PLEASE SCHEDULE YOUR APPOINTMENTS Come back for   a physical exam in 6 months

## 2022-12-05 NOTE — Assessment & Plan Note (Signed)
WNU:[id number] BPs mostly within normal. Occasionally in the 150 range.  Rec continue telmisartan, metoprolol, diltiazem, HCTZ, continue checking BPs.  Labs. Hyperlipidemia: On rosuvastatin and Zetia.  Check AST ALT. Osteopenia: Per GYN, they are considering Boniva holiday. OSA: Having a hard time tolerating CPAP, extensive advice provided, request hydrocodone to help with tolerance.  PDMP reviewed, Rx sent noting that hydrocodone is  on the "allergy list" but she can tolerate very low doses. UTI: See LOV, saw gynecology, Rx sent vaginal cream. Vaccine advice provided RTC 6 months CPX

## 2022-12-05 NOTE — Progress Notes (Signed)
Subjective:    Patient ID: Emily Thompson, female    DOB: Jun 12, 1945, 77 y.o.   MRN: [id number]  DOS:  12/05/2022 Type of visit - description: Follow-up  Chronic medical issues addressed Having some problems with CPAP tolerance No further UTI symptoms Ambulatory BPs mostly normal, occasionally in the 150, 160.  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     GERD (gastroesophageal reflux disease)    History of breast cancer, left.  March 2003. 12/29/2010   HTN (hypertension) 03/14/2010   Qualifier: Diagnosis of  By: Drue Novel MD, Nolon Rod.    Hyperlipidemia 03/14/2010   Qualifier: Diagnosis of  By: Drue Novel MD, Nolon Rod.    Hypertension    Melanoma ([facility]) 2012   righ side face; basal cell (nose)    Menopause 12/25/2010   Near syncope 10/26/2019   Osteopenia    OSTEOPENIA 03/14/2010   Qualifier: Diagnosis of  By: Drue Novel MD, Yizel Canby E.    Palpitations 05/20/2013   PCP NOTES >>>>>>>>>>>>>>>>>>>>>>>>>>>>>>>>>>>>> 07/03/2015   Personal history of colonic polyps 07/12/2020   PONV (postoperative nausea and vomiting)    Post-menopausal atrophic vaginitis 12/25/2010   Postmenopausal bleeding 01/03/2015   Scoliosis    Sleep apnea    SVT (supraventricular tachycardia)    used to see Dr Clarene Duke    Thickened endometrium  01/05/2015    Past Surgical History:  Procedure Laterality Date   ABDOMINAL HYSTERECTOMY  2016   BASAL CELL CARCINOMA EXCISION     BREAST LUMPECTOMY Left 07/2001   BREAST RECONSTRUCTION  2005   CESAREAN SECTION  1977   COLONOSCOPY W/ POLYPECTOMY  2013   NEXT DUE IN 5 YRS   NM MYOCAR PERF WALL MOTION  01/2006   bruce myoview; no evidence of inducible ischemia, anterior wall thinning without evidence of ischemia; post-stress EF 88%; low risk scan    NOSE SURGERY     basal cell carcinoma removed   R shoulder Arthroscopy and spur removal  02/15/20222   ROBOTIC ASSISTED TOTAL HYSTERECTOMY WITH BILATERAL SALPINGO OOPHERECTOMY Bilateral 01/27/2015   Procedure: ROBOTIC ASSISTED TOTAL HYSTERECTOMY WITH BILATERAL SALPINGO OOPHORECTOMY AND SENTINAL NODE BIOPSY;  Surgeon: Adolphus Birchwood, MD;  Location: [facility];  Service: Gynecology;  Laterality: Bilateral;   SHOULDER SURGERY Right    SKIN CANCER EXCISION     melanoma   TONSILLECTOMY AND ADENOIDECTOMY  1952   TRANSTHORACIC ECHOCARDIOGRAM  12/2005   EF 50-55%; mild MR; trace TR   TUBAL LIGATION  1978   VULVA /PERINEUM BIOPSY     PAPILLOMA    Current Outpatient Medications  Medication Instructions   CALCIUM-VITAMIN D-VITAMIN K PO 1,200 mg, Oral, Daily   cholecalciferol (VITAMIN D) 1,000 Units, Oral, Daily   diltiazem (TIADYLT ER) 240 mg, Oral, Daily   estradiol (ESTRACE VAGINAL) 1  g, Vaginal, 2 times weekly, Apply externally only   ezetimibe (ZETIA) 10 mg, Oral, Daily   hydrochlorothiazide (HYDRODIURIL) 12.5 mg, Oral, Daily   HYDROcodone bit-homatropine (HYCODAN) 5-1.5 MG/5ML syrup 5 mLs, Oral, At bedtime PRN   hydrocortisone 2.5 % cream Topical   ibandronate (BONIVA) 150 mg, Oral, Every 30 days, Take in am with a full glass of water,  do not  lie down for the next 30 min.   lansoprazole (PREVACID) 15 mg, Oral, Daily   meloxicam (MOBIC) 15 mg, Oral, Daily PRN   methocarbamol (ROBAXIN) 500-1,000 mg, Oral, Every 6 hours PRN   metoprolol  succinate (TOPROL-XL) 12.5 mg, Oral, Daily, TAKE WITH OR IMMEDIATELY FOLLOWING A MEAL.   Multiple Vitamins-Minerals (PRESERVISION AREDS 2 PO) 2 capsules, Oral, Daily, Unknown strenght   nystatin cream (MYCOSTATIN) 1 Application, Topical, 2 times daily   Probiotic Product (PROBIOTIC-10 PO) Oral   rosuvastatin (CRESTOR) 5 mg, Oral, 4 times weekly   telmisartan (MICARDIS) 40 MG tablet TAKE 1 TABLET BY MOUTH EVERY DAY   UNABLE TO FIND Med Name: [employer] Womens Daily Probiotic       Objective:   Physical Exam BP 122/68   Pulse 65   Temp 98 F (36.7 C) (Oral)   Resp 16   Ht 5' (1.524 m)   Wt 150 lb 6 oz (68.2 kg)   LMP 05/14/1997   SpO2 95%   BMI 29.37 kg/m  General:   Well developed, NAD, BMI noted. HEENT:  Normocephalic . Face symmetric, atraumatic Lungs:  CTA B Normal respiratory effort, no intercostal retractions, no accessory muscle use. Heart: RRR,  no murmur.  Lower extremities: no pretibial edema bilaterally  Skin: Not pale. Not jaundice Neurologic:  alert & oriented X3.  Speech normal, gait appropriate for age and unassisted Psych--  Cognition and judgment appear intact.  Cooperative with normal attention span and concentration.  Behavior appropriate. No anxious or depressed appearing.      Assessment    Assessment HTN Hyperlipidemia (rosuvastatin 4 times a week, intolerant to higher doses) GERD Osteopenia: Per gynecology.  dexa 2016 (per gyn); rx boniva 05-2017  Insomnia  MSK: ---DJD,  NSAIDs prn --Back pain, --Dr Brynda Greathouse ~ 2011, saw the spine center ~ 2015 , dx w/  spinal stenosis (no MRI, clinical dx ) --2017-2018: PT, injections Dr Kristine Royal Cough-- hycodan prn (tolerates well small doses hydrocodone) Oncology: --Breast cancer 2003 --Endometrial cancer, H-BSO 01/2015, will see hematology x 5 years (gyn-onc) --Melanoma, BCC (Dr Emily Filbert) Lung nodule, 3mm, per CTs, last CT 09/30/2015 : stable  H/o SVT  HOH OSA: Severe, Dx 2023. On  Cpap   PLAN: [id number] BPs mostly within normal. Occasionally in the 150 range.  Rec continue telmisartan, metoprolol, diltiazem, HCTZ, continue checking BPs.  Labs. Hyperlipidemia: On rosuvastatin and Zetia.  Check AST ALT. Osteopenia: Per GYN, they are considering Boniva holiday. OSA: Having a hard time tolerating CPAP, extensive advice provided, request hydrocodone to help with tolerance.  PDMP reviewed, Rx sent noting that hydrocodone is  on the "allergy list" but she can tolerate very low doses. UTI: See LOV, saw gynecology, Rx sent vaginal cream. Vaccine advice provided RTC 6 months CPX

## 2022-12-24 ENCOUNTER — Other Ambulatory Visit: Payer: Self-pay | Admitting: Internal Medicine

## 2022-12-25 DIAGNOSIS — H353132 Nonexudative age-related macular degeneration, bilateral, intermediate dry stage: Secondary | ICD-10-CM | POA: Diagnosis not present

## 2022-12-25 DIAGNOSIS — G4733 Obstructive sleep apnea (adult) (pediatric): Secondary | ICD-10-CM | POA: Diagnosis not present

## 2023-01-07 ENCOUNTER — Other Ambulatory Visit: Payer: Self-pay | Admitting: Internal Medicine

## 2023-01-07 MED ORDER — MELOXICAM 15 MG PO TABS: 15.0000 mg | ORAL_TABLET | Freq: Every day | ORAL | 0 refills | Status: DC | PRN

## 2023-01-10 ENCOUNTER — Other Ambulatory Visit: Payer: Self-pay | Admitting: Internal Medicine

## 2023-01-22 DIAGNOSIS — Z1231 Encounter for screening mammogram for malignant neoplasm of breast: Secondary | ICD-10-CM | POA: Diagnosis not present

## 2023-01-24 ENCOUNTER — Encounter: Payer: Self-pay | Admitting: Nurse Practitioner

## 2023-01-25 DIAGNOSIS — G4733 Obstructive sleep apnea (adult) (pediatric): Secondary | ICD-10-CM | POA: Diagnosis not present

## 2023-01-30 DIAGNOSIS — D225 Melanocytic nevi of trunk: Secondary | ICD-10-CM | POA: Diagnosis not present

## 2023-01-30 DIAGNOSIS — L578 Other skin changes due to chronic exposure to nonionizing radiation: Secondary | ICD-10-CM | POA: Diagnosis not present

## 2023-01-30 DIAGNOSIS — Z85828 Personal history of other malignant neoplasm of skin: Secondary | ICD-10-CM | POA: Diagnosis not present

## 2023-01-30 DIAGNOSIS — L309 Dermatitis, unspecified: Secondary | ICD-10-CM | POA: Diagnosis not present

## 2023-01-30 DIAGNOSIS — Z86006 Personal history of melanoma in-situ: Secondary | ICD-10-CM | POA: Diagnosis not present

## 2023-01-30 DIAGNOSIS — D485 Neoplasm of uncertain behavior of skin: Secondary | ICD-10-CM | POA: Diagnosis not present

## 2023-01-30 DIAGNOSIS — L821 Other seborrheic keratosis: Secondary | ICD-10-CM | POA: Diagnosis not present

## 2023-01-30 DIAGNOSIS — L820 Inflamed seborrheic keratosis: Secondary | ICD-10-CM | POA: Diagnosis not present

## 2023-01-30 DIAGNOSIS — C44612 Basal cell carcinoma of skin of right upper limb, including shoulder: Secondary | ICD-10-CM | POA: Diagnosis not present

## 2023-02-03 ENCOUNTER — Other Ambulatory Visit: Payer: Self-pay | Admitting: Internal Medicine

## 2023-02-13 DIAGNOSIS — G4733 Obstructive sleep apnea (adult) (pediatric): Secondary | ICD-10-CM | POA: Diagnosis not present

## 2023-02-19 ENCOUNTER — Ambulatory Visit: Payer: PPO | Admitting: Family Medicine

## 2023-02-19 ENCOUNTER — Encounter: Payer: Self-pay | Admitting: Family Medicine

## 2023-02-19 VITALS — BP 117/56 | HR 83 | Wt 149.4 lb

## 2023-02-19 DIAGNOSIS — R300 Dysuria: Secondary | ICD-10-CM | POA: Diagnosis not present

## 2023-02-19 NOTE — Patient Instructions (Signed)
Please bring urine sample so we can evaluate  Continue supportive measures - hydration, good hygiene, cranberry supplements

## 2023-02-19 NOTE — Progress Notes (Signed)
Acute Office Visit  Subjective:     Patient ID: Emily Thompson, female    DOB: 1945-08-08, 77 y.o.   MRN: [id number]  Chief Complaint  Patient presents with   Urinary Tract Infection    Woke up this morning. Painful urination, burning. No discharge       Patient is in today for dysuria.   Discussed the use of AI scribe software for clinical note transcription with the patient, who gave verbal consent to proceed.  History of Present Illness   The patient presents with acute onset urinary symptoms starting earlier today. She reports a burning sensation during urination and a general feeling of malaise, described as feeling "yucky" and "shivery." She denies any abdominal pain but notes a sensation of pressure over the bladder. No associated fevers, nausea, vomiting, or diarrhea are reported.  The patient also mentions increased urinary frequency, specifically noting waking up twice in the night to urinate, which is unusual for her. She has a history of urinary tract infections (UTIs), with a few episodes occurring about a year ago. Since then, she has been on estradiol, which seemed to have prevented further UTIs until now.  The patient also mentions chronic back pain, which was slightly worse the day before the urinary symptoms started. She wonders if this might be related to the current urinary symptoms. However, she denies any pain in the kidney area when examined.  The patient's last bowel movement was reported as normal the previous day. No other new or concerning symptoms were reported.              ROS All review of systems negative except what is listed in the HPI      Objective:    BP (!) 117/56   Pulse 83   Wt 149 lb 6 oz (67.8 kg)   LMP 05/14/1997   SpO2 98%   BMI 29.17 kg/m    Physical Exam Vitals reviewed.  Constitutional:      Appearance: Normal appearance.  Cardiovascular:     Rate and Rhythm: Normal rate and regular rhythm.     Heart  sounds: Normal heart sounds.  Pulmonary:     Effort: Pulmonary effort is normal.     Breath sounds: Normal breath sounds.  Abdominal:     General: Abdomen is flat. Bowel sounds are normal. There is no distension.     Palpations: Abdomen is soft.     Tenderness: There is no abdominal tenderness. There is no right CVA tenderness or left CVA tenderness.  Skin:    General: Skin is warm and dry.  Neurological:     Mental Status: She is alert and oriented to person, place, and time.  Psychiatric:        Mood and Affect: Mood normal.        Behavior: Behavior normal.        Thought Content: Thought content normal.        Judgment: Judgment normal.     No results found for any visits on 02/19/23.      Assessment & Plan:   Problem List Items Addressed This Visit   None Visit Diagnoses     Dysuria    -  Primary        Urinary Tract Infection (UTI) Acute onset of dysuria, urinary frequency, and suprapubic pressure. No fever, nausea, vomiting, or diarrhea. 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 -Collect urine sample for urinalysis and culture. Unable  to leave sample, will bring back      No orders of the defined types were placed in this encounter.   Return if symptoms worsen or fail to improve.  Clayborne Dana, NP

## 2023-02-19 NOTE — Addendum Note (Signed)
Addended by: Donne Anon L on: 02/19/2023 04:03 PM   Modules accepted: Orders

## 2023-02-20 LAB — URINALYSIS, ROUTINE W REFLEX MICROSCOPIC
Bilirubin Urine: NEGATIVE
Ketones, ur: NEGATIVE
Nitrite: NEGATIVE
Specific Gravity, Urine: <=1.005 — AB (ref 1.000–1.030)
Total Protein, Urine: NEGATIVE
Urine Glucose: NEGATIVE
Urobilinogen, UA: 0.2 (ref 0.0–1.0)
pH: 7 (ref 5.0–8.0)

## 2023-02-20 MED ORDER — CEPHALEXIN 500 MG PO CAPS: 500.0000 mg | ORAL_CAPSULE | Freq: Two times a day (BID) | ORAL | 0 refills | Status: AC

## 2023-02-20 NOTE — Addendum Note (Signed)
Addended by: Hyman Hopes B on: 02/20/2023 02:27 PM   Modules accepted: Orders

## 2023-02-21 LAB — URINE CULTURE
MICRO NUMBER:: 15567019
SPECIMEN QUALITY:: ADEQUATE

## 2023-02-24 DIAGNOSIS — G4733 Obstructive sleep apnea (adult) (pediatric): Secondary | ICD-10-CM | POA: Diagnosis not present

## 2023-03-10 ENCOUNTER — Other Ambulatory Visit: Payer: Self-pay | Admitting: Internal Medicine

## 2023-03-22 ENCOUNTER — Ambulatory Visit (INDEPENDENT_AMBULATORY_CARE_PROVIDER_SITE_OTHER): Payer: PPO | Admitting: Physician Assistant

## 2023-03-22 ENCOUNTER — Encounter: Payer: Self-pay | Admitting: Physician Assistant

## 2023-03-22 VITALS — BP 146/84 | HR 80 | Temp 97.5°F | Ht 60.0 in | Wt 150.5 lb

## 2023-03-22 DIAGNOSIS — N3001 Acute cystitis with hematuria: Secondary | ICD-10-CM | POA: Diagnosis not present

## 2023-03-22 DIAGNOSIS — R300 Dysuria: Secondary | ICD-10-CM | POA: Diagnosis not present

## 2023-03-22 LAB — POCT URINALYSIS DIP (MANUAL ENTRY)
Bilirubin, UA: NEGATIVE
Glucose, UA: NEGATIVE mg/dL
Ketones, POC UA: NEGATIVE mg/dL
Nitrite, UA: NEGATIVE
Protein Ur, POC: 30 mg/dL — AB
Spec Grav, UA: 1.01 (ref 1.010–1.025)
Urobilinogen, UA: 0.2 U/dL
pH, UA: 6 (ref 5.0–8.0)

## 2023-03-22 MED ORDER — CEPHALEXIN 500 MG PO CAPS: 500.0000 mg | ORAL_CAPSULE | Freq: Two times a day (BID) | ORAL | 0 refills | Status: AC

## 2023-03-22 NOTE — Progress Notes (Signed)
Established patient visit   Patient: Emily Thompson   DOB: [birth year]   77 y.o. Female  MRN: [id number] Visit Date: 03/22/2023  Today's healthcare provider: Alfredia Ferguson, PA-C   Cc. Dysuria x 1 day  Subjective    Pt reports dysuria and abdominal pressure x 1 day. She is using topical estrogen, taking urinary health probiotics and drinking plenty of water. Urinary frequency and stress incontinence at baseline. She denies bowel changes, incontinence.   This will be her fourth UTI this year.   Medications: Outpatient Medications Prior to Visit  Medication Sig   CALCIUM-VITAMIN D-VITAMIN K PO Take 1,200 mg by mouth daily.   cholecalciferol (VITAMIN D) 1000 units tablet Take 1,000 Units by mouth daily.   diltiazem (TIADYLT ER) 240 MG 24 hr capsule Take 1 capsule (240 mg total) by mouth daily.   estradiol (ESTRACE VAGINAL) 0.1 MG/GM vaginal cream Place 1 g vaginally 2 (two) times a week. Apply externally only   ezetimibe (ZETIA) 10 MG tablet Take 1 tablet (10 mg total) by mouth daily.   hydrochlorothiazide (HYDRODIURIL) 12.5 MG tablet Take 1 tablet (12.5 mg total) by mouth daily.   HYDROcodone bit-homatropine (HYCODAN) 5-1.5 MG/5ML syrup Take 5 mLs by mouth at bedtime as needed for cough.   hydrocortisone 2.5 % cream Apply topically.   ibandronate (BONIVA) 150 MG tablet Take 1 tablet (150 mg total) by mouth every 30 (thirty) days. Take in am with a full glass of water,  do not  lie down for the next 30 min.   lansoprazole (PREVACID) 15 MG capsule Take 15 mg by mouth daily at 12 noon.   meloxicam (MOBIC) 15 MG tablet Take 1 tablet (15 mg total) by mouth daily as needed for pain. Always take with food   methocarbamol (ROBAXIN) 500 MG tablet Take 500-1,000 mg by mouth every 6 (six) hours as needed.   metoprolol succinate (TOPROL-XL) 25 MG 24 hr tablet TAKE 0.5 TABLETS BY MOUTH DAILY. TAKE WITH OR IMMEDIATELY FOLLOWING A MEAL.   Multiple Vitamins-Minerals (PRESERVISION AREDS 2 PO)  Take 2 capsules by mouth daily. Unknown strenght   nystatin cream (MYCOSTATIN) Apply 1 Application topically 2 (two) times daily.   Probiotic Product (PROBIOTIC-10 PO) Take by mouth.   rosuvastatin (CRESTOR) 5 MG tablet Take 1 tablet (5 mg total) by mouth 4 (four) times a week.   telmisartan (MICARDIS) 40 MG tablet Take 1 tablet (40 mg total) by mouth daily.   UNABLE TO FIND Med Name: [employer] Womens Daily Probiotic   No facility-administered medications prior to visit.    Review of Systems  Constitutional:  Negative for fatigue and fever.  Respiratory:  Negative for cough and shortness of breath.   Cardiovascular:  Negative for chest pain and leg swelling.  Gastrointestinal:  Negative for abdominal pain.  Genitourinary:  Positive for dysuria and frequency.  Neurological:  Negative for dizziness and headaches.       Objective    BP (!) 146/84   Pulse 80   Temp (!) 97.5 F (36.4 C) (Oral)   Ht 5' (1.524 m)   Wt 150 lb 8 oz (68.3 kg)   LMP 05/14/1997   SpO2 96%   BMI 29.39 kg/m    Physical Exam Vitals reviewed.  Constitutional:      Appearance: She is not ill-appearing.  HENT:     Head: Normocephalic.  Eyes:     Conjunctiva/sclera: Conjunctivae normal.  Cardiovascular:     Rate and Rhythm:  Normal rate.  Pulmonary:     Effort: Pulmonary effort is normal. No respiratory distress.  Abdominal:     Palpations: Abdomen is soft.     Tenderness: There is no abdominal tenderness. There is no guarding or rebound.  Neurological:     General: No focal deficit present.     Mental Status: She is alert and oriented to person, place, and time.  Psychiatric:        Mood and Affect: Mood normal.        Behavior: Behavior normal.      Results for orders placed or performed in visit on 03/22/23  POCT urinalysis dipstick  Result Value Ref Range   Color, UA yellow yellow   Clarity, UA cloudy (A) clear   Glucose, UA negative negative mg/dL   Bilirubin, UA negative  negative   Ketones, POC UA negative negative mg/dL   Spec Grav, UA 9.562 1.308 - 1.025   Blood, UA large (A) negative   pH, UA 6.0 5.0 - 8.0   Protein Ur, POC =30 (A) negative mg/dL   Urobilinogen, UA 0.2 0.2 or 1.0 E.U./dL   Nitrite, UA Negative Negative   Leukocytes, UA Large (3+) (A) Negative    Assessment & Plan    Acute cystitis with hematuria -     Cephalexin; Take 1 capsule (500 mg total) by mouth 2 (two) times daily for 5 days.  Dispense: 10 capsule; Refill: 0  Dysuria -     POCT urinalysis dipstick    Rx keflex 500 mg bid x 5 days.  Last uti 02/2023, this is her fourth this year.  She is already on preventative management w/ estrogen creams. Recommending a UA when she is not symptomatic as she often has microhematuria.  Consider ref to urology if she develops another infection in the next 3 months  Return if symptoms worsen or fail to improve.       Alfredia Ferguson, PA-C  [facility] [phone] (phone) [phone] (fax)  [facility]

## 2023-03-26 DIAGNOSIS — C44519 Basal cell carcinoma of skin of other part of trunk: Secondary | ICD-10-CM | POA: Diagnosis not present

## 2023-03-27 DIAGNOSIS — G4733 Obstructive sleep apnea (adult) (pediatric): Secondary | ICD-10-CM | POA: Diagnosis not present

## 2023-04-20 ENCOUNTER — Other Ambulatory Visit: Payer: Self-pay | Admitting: Internal Medicine

## 2023-04-26 DIAGNOSIS — M47816 Spondylosis without myelopathy or radiculopathy, lumbar region: Secondary | ICD-10-CM | POA: Diagnosis not present

## 2023-04-26 DIAGNOSIS — M5416 Radiculopathy, lumbar region: Secondary | ICD-10-CM | POA: Diagnosis not present

## 2023-04-30 ENCOUNTER — Ambulatory Visit (INDEPENDENT_AMBULATORY_CARE_PROVIDER_SITE_OTHER): Payer: PPO | Admitting: Nurse Practitioner

## 2023-04-30 ENCOUNTER — Other Ambulatory Visit: Payer: Self-pay

## 2023-04-30 VITALS — BP 136/78 | HR 74 | Ht 58.5 in | Wt 151.6 lb

## 2023-04-30 DIAGNOSIS — M810 Age-related osteoporosis without current pathological fracture: Secondary | ICD-10-CM

## 2023-04-30 DIAGNOSIS — Z01419 Encounter for gynecological examination (general) (routine) without abnormal findings: Secondary | ICD-10-CM

## 2023-04-30 DIAGNOSIS — Z780 Asymptomatic menopausal state: Secondary | ICD-10-CM

## 2023-04-30 DIAGNOSIS — Z8542 Personal history of malignant neoplasm of other parts of uterus: Secondary | ICD-10-CM | POA: Diagnosis not present

## 2023-04-30 DIAGNOSIS — N952 Postmenopausal atrophic vaginitis: Secondary | ICD-10-CM | POA: Diagnosis not present

## 2023-04-30 DIAGNOSIS — N958 Other specified menopausal and perimenopausal disorders: Secondary | ICD-10-CM

## 2023-04-30 DIAGNOSIS — B372 Candidiasis of skin and nail: Secondary | ICD-10-CM | POA: Diagnosis not present

## 2023-04-30 DIAGNOSIS — Z853 Personal history of malignant neoplasm of breast: Secondary | ICD-10-CM

## 2023-04-30 MED ORDER — ESTRADIOL 0.1 MG/GM VA CREA: 1.0000 g | TOPICAL_CREAM | VAGINAL | 3 refills | Status: DC

## 2023-04-30 MED ORDER — IBANDRONATE SODIUM 150 MG PO TABS: 150.0000 mg | ORAL_TABLET | ORAL | 0 refills | Status: DC

## 2023-04-30 MED ORDER — NYSTATIN 100000 UNIT/GM EX CREA
1.0000 | TOPICAL_CREAM | Freq: Two times a day (BID) | CUTANEOUS | 2 refills | Status: AC
Start: 2023-04-30 — End: ?

## 2023-04-30 NOTE — Telephone Encounter (Signed)
Med refill request: ibandronate sodium 150mg  Last AEX: 03/16/21 Next AEX: 04/30/23 Last MMG (if hormonal med) n/a Last DEXA:  07/05/21 Refill sent to provider for approval or denial as appropriate.

## 2023-04-30 NOTE — Progress Notes (Signed)
Emily Thompson 1945-10-28 [id number]   History:  77 y.o. G1P1 presents for breast and pelvic exam. No GYN complaints. Postmenopausal. 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 Started vaginal estrogen twice weekly for recurrent UTIs/GSM. Has had one UTI since starting. Taking probiotic and D-mannose as well. Normal pap history. 2003 left breast cancer. History of melanoma, has annual skin checks with dermatology.  Osteoporosis-was on Boniva x 5 years while on Femara, restarted on Boniva 04/2017 with improvement and tolerating well. We have discussed holiday versus switching to alternative. She is hesitant to switch to long-acting treatment.   Gynecologic History Patient's last menstrual period was 05/14/1997.   Contraception/Family planning: status post hysterectomy Sexually active: No  Health maintenance Last Pap: 03/15/2020 Results were: Normal Last mammogram: 01/22/2023. Results were: Normal Last colonoscopy: 2018. Results were: polyps Last Dexa: 07/04/2021. Results were: T-score -1.8  Past medical history, past surgical history, family history and social history were all reviewed and documented in the EPIC chart. Widowed. Has boyfriend. Daughter in [place], has 2 children. Adopted daughter in [place].   ROS:  A ROS was performed and pertinent positives and negatives are included.  Exam:  Vitals:   04/30/23 1447  BP: 136/78  Pulse: 74  SpO2: 98%  Weight: 151 lb 9.6 oz (68.8 kg)  Height: 4' 10.5" (1.486 m)     Body mass index is 31.15 kg/m.  General appearance:  Normal Thyroid:  Symmetrical, normal in size, without palpable masses or nodularity. Respiratory  Auscultation:  Clear without wheezing or rhonchi Cardiovascular  Auscultation:  Regular rate, without rubs, murmurs or gallops  Edema/varicosities:  Not grossly evident Abdominal  Soft,nontender, without masses, guarding or rebound.  Liver/spleen:  No organomegaly noted  Hernia:  None  appreciated  Skin  Inspection:  Grossly normal   Breasts: Examined lying and sitting.   Right: Without masses, retractions, discharge or axillary adenopathy. Reduction.    Left: Lumpectomy with implant, scar tissue Pelvic: External genitalia:  no lesions              Urethra:  normal appearing urethra with no masses, tenderness or lesions              Bartholins and Skenes: normal                 Vagina: normal appearing vagina with normal color and discharge, no lesions. Atrophic changes              Cervix: absent Bimanual Exam:  Uterus:  absent              Adnexa: no mass, fullness, tenderness              Rectovaginal: Deferred              Anus:  normal, no lesions  Patient informed chaperone available to be present for breast and pelvic exam. Patient has requested no chaperone to be present. Patient has been advised what will be completed during breast and pelvic exam.   Assessment/Plan:  77 y.o. G1P1 for breast and pelvic exam.   Encounter for breast and pelvic examination - Education provided on SBEs, importance of preventative screenings, current guidelines, high calcium diet, regular exercise, and multivitamin daily. Labs with PCP.   Postmenopausal - no HRT  Age-related osteoporosis without current pathological fracture - Plan: DG Bone Density, ibandronate (BONIVA) 150 MG tablet monthly.  06/2021 T-score  -1.8.  Was on Boniva for 5 years while  taking Femara for breast cancer treatment years ago.  Was restarted in December 2018 and she is tolerating well.  She is very active with cardio and resistance training. Again discussed holiday versus alternative treatment. Will repeat DXA in February 2025.   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 2016, TVH BSO.  History of breast cancer - 2003 left managed with lumpectomy, radiation, chemotherapy, and Femara.  Subsequent mammograms normal.  Continue annual screenings.  Normal breast exam today.  Genitourinary  syndrome of menopause - Plan: estradiol (ESTRACE VAGINAL) 0.1 MG/GM vaginal cream externally twice weekly.  Vaginal atrophy - Plan: estradiol (ESTRACE VAGINAL) 0.1 MG/GM vaginal cream externally twice weekly.   Yeast infection of the skin - Plan: nystatin cream (MYCOSTATIN) BID as needed.   Screening for cervical cancer - Normal Pap history.  No longer screening per guidelines.   Screening for colon cancer - 2018 colonoscopy. No longer screening per GI.    Return in about 1 year (around 04/29/2024) for Med follow up.      Olivia Mackie [facility], 3:24 PM 04/30/2023

## 2023-05-03 ENCOUNTER — Other Ambulatory Visit: Payer: Self-pay | Admitting: Internal Medicine

## 2023-05-03 DIAGNOSIS — M545 Low back pain, unspecified: Secondary | ICD-10-CM | POA: Diagnosis not present

## 2023-05-21 DIAGNOSIS — C50912 Malignant neoplasm of unspecified site of left female breast: Secondary | ICD-10-CM | POA: Diagnosis not present

## 2023-05-23 DIAGNOSIS — G4733 Obstructive sleep apnea (adult) (pediatric): Secondary | ICD-10-CM | POA: Diagnosis not present

## 2023-05-27 DIAGNOSIS — M47816 Spondylosis without myelopathy or radiculopathy, lumbar region: Secondary | ICD-10-CM | POA: Diagnosis not present

## 2023-05-27 DIAGNOSIS — M5416 Radiculopathy, lumbar region: Secondary | ICD-10-CM | POA: Diagnosis not present

## 2023-06-16 ENCOUNTER — Other Ambulatory Visit: Payer: Self-pay | Admitting: Internal Medicine

## 2023-06-19 ENCOUNTER — Encounter: Payer: Self-pay | Admitting: Internal Medicine

## 2023-06-19 ENCOUNTER — Ambulatory Visit (INDEPENDENT_AMBULATORY_CARE_PROVIDER_SITE_OTHER): Payer: PPO | Admitting: Internal Medicine

## 2023-06-19 VITALS — BP 124/80 | HR 55 | Temp 98.0°F | Resp 16 | Ht 58.5 in | Wt 147.4 lb

## 2023-06-19 DIAGNOSIS — I10 Essential (primary) hypertension: Secondary | ICD-10-CM | POA: Diagnosis not present

## 2023-06-19 DIAGNOSIS — Z0000 Encounter for general adult medical examination without abnormal findings: Secondary | ICD-10-CM | POA: Diagnosis not present

## 2023-06-19 DIAGNOSIS — E782 Mixed hyperlipidemia: Secondary | ICD-10-CM | POA: Diagnosis not present

## 2023-06-19 LAB — COMPREHENSIVE METABOLIC PANEL
ALT: 13 U/L (ref 0–35)
AST: 23 U/L (ref 0–37)
Albumin: 4.2 g/dL (ref 3.5–5.2)
Alkaline Phosphatase: 92 U/L (ref 39–117)
BUN: 12 mg/dL (ref 6–23)
CO2: 30 meq/L (ref 19–32)
Calcium: 9.4 mg/dL (ref 8.4–10.5)
Chloride: 102 meq/L (ref 96–112)
Creatinine, Ser: 0.71 mg/dL (ref 0.40–1.20)
GFR: 80.81 mL/min (ref >60.00)
Glucose, Bld: 80 mg/dL (ref 70–99)
Potassium: 4 meq/L (ref 3.5–5.1)
Sodium: 140 meq/L (ref 135–145)
Total Bilirubin: 0.6 mg/dL (ref 0.2–1.2)
Total Protein: 6.5 g/dL (ref 6.0–8.3)

## 2023-06-19 LAB — LIPID PANEL
Cholesterol: 174 mg/dL (ref 0–200)
HDL: 74.3 mg/dL (ref >39.00)
LDL Cholesterol: 85 mg/dL (ref 0–99)
NonHDL: 99.79
Total CHOL/HDL Ratio: 2
Triglycerides: 74 mg/dL (ref 0.0–149.0)
VLDL: 14.8 mg/dL (ref 0.0–40.0)

## 2023-06-19 MED ORDER — METHOCARBAMOL 500 MG PO TABS: 500.0000 mg | ORAL_TABLET | Freq: Four times a day (QID) | ORAL | 1 refills | Status: DC | PRN

## 2023-06-19 NOTE — Progress Notes (Signed)
 Subjective:    Patient ID: Emily Thompson, female    DOB: [birth year], 78 y.o.   MRN: [id number]  DOS:  06/19/2023 Type of visit - description: CPX  Here for CPX. Chronic medical problems addressed. In general feeling well. Main concern now is back pain, working with orthopedics. Request a refill on methocarbamol .  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     GERD (gastroesophageal reflux disease)    History of breast cancer, left.  March 2003. 12/29/2010   HTN (hypertension) 03/14/2010   Qualifier: Diagnosis of  By: Amon MD, Aloysius BRAVO.    Hyperlipidemia 03/14/2010   Qualifier: Diagnosis of  By: Amon MD, Aloysius BRAVO.    Hypertension    Melanoma (HCC) 2012   righ side face; basal cell (nose)    Menopause 12/25/2010   Near syncope 10/26/2019   Osteopenia    OSTEOPENIA 03/14/2010   Qualifier: Diagnosis of  By: Amon MD, Jenaro Souder E.    Palpitations 05/20/2013   PCP NOTES >>>>>>>>>>>>>>>>>>>>>>>>>>>>>>>>>>>>> 07/03/2015   Personal history of colonic polyps 07/12/2020   PONV (postoperative nausea and vomiting)    Post-menopausal atrophic vaginitis 12/25/2010   Postmenopausal bleeding 01/03/2015   Scoliosis    Sleep apnea    SVT (supraventricular tachycardia) (HCC)    used to see Dr Morgan    Thickened  endometrium 01/05/2015    Past Surgical History:  Procedure Laterality Date   ABDOMINAL HYSTERECTOMY  2016   BASAL CELL CARCINOMA EXCISION     BREAST LUMPECTOMY Left 07/2001   BREAST RECONSTRUCTION  2005   CESAREAN SECTION  1977   COLONOSCOPY W/ POLYPECTOMY  2013   NEXT DUE IN 5 YRS   NM MYOCAR PERF WALL MOTION  01/2006   bruce myoview; no evidence of inducible ischemia, anterior wall thinning without evidence of ischemia; post-stress EF 88%; low risk scan    NOSE SURGERY     basal cell carcinoma removed   R shoulder Arthroscopy and spur removal  02/15/20222   ROBOTIC ASSISTED TOTAL HYSTERECTOMY WITH BILATERAL SALPINGO OOPHERECTOMY Bilateral 01/27/2015   Procedure: ROBOTIC ASSISTED TOTAL HYSTERECTOMY WITH BILATERAL SALPINGO OOPHORECTOMY AND SENTINAL NODE BIOPSY;  Surgeon: Maurilio Ship, MD;  Location: [facility];  Service: Gynecology;  Laterality: Bilateral;   SHOULDER SURGERY Right    SKIN CANCER EXCISION     melanoma   TONSILLECTOMY AND ADENOIDECTOMY  1952   TRANSTHORACIC ECHOCARDIOGRAM  12/2005   EF 50-55%; mild MR; trace TR   TUBAL LIGATION  1978   VULVA /PERINEUM BIOPSY     PAPILLOMA   Social History   Socioeconomic History   Marital status: Widowed    Spouse name: Not on file   Number of children: 2   Years of education: BSN  Highest education level: Not on file  Occupational History   Occupation: nurse, 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)    Employer: RETIRED  Tobacco Use   Smoking status: Former    Current packs/day: 0.00    Types: Cigarettes    Start date: 05/21/1963    Quit date: 05/20/1973    Years since quitting: 50.1   Smokeless tobacco: Never  Vaping Use   Vaping status: Never Used  Substance and Sexual Activity   Alcohol use: Yes    Comment: occ   Drug use: No   Sexual activity: Not Currently    Birth control/protection: Post-menopausal    Comment: First IC >26 y/o, Partners <5  Other Topics Concern   Not on file  Social History Narrative   Widow, 1  adopted and 1 natural child; ([place], [place])   Live by herself   Lost her long-term companion Norleen Lidji  09/11/2021   Social Drivers of Health   Financial Resource Strain: Low Risk  (07/31/2022)   Overall Financial Resource Strain (CARDIA)    Difficulty of Paying Living Expenses: Not hard at all  Food Insecurity: No Food Insecurity (07/31/2022)   Hunger Vital Sign    Worried About Running Out of Food in the Last Year: Never true    Ran Out of Food in the Last Year: Never true  Transportation Needs: No Transportation Needs (07/31/2022)   PRAPARE - Administrator, Civil Service (Medical): No    Lack of Transportation (Non-Medical): No  Physical Activity: Not on file  Stress: Not on file  Social Connections: Not on file  Intimate Partner Violence: Not on file    Current Outpatient Medications  Medication Instructions   CALCIUM -VITAMIN D -VITAMIN K PO 1,200 mg, Daily   cholecalciferol (VITAMIN D ) 1,000 Units, Daily   diltiazem  (TIADYLT  ER) 240 mg, Oral, Daily   estradiol  (ESTRACE  VAGINAL) 1 g, Vaginal, 2 times weekly, Apply externally only   ezetimibe  (ZETIA ) 10 mg, Oral, Daily   hydrochlorothiazide  (HYDRODIURIL ) 12.5 mg, Oral, Daily   HYDROcodone  bit-homatropine (HYCODAN) 5-1.5 MG/5ML syrup 5 mLs, Oral, At bedtime PRN   hydrocortisone 2.5 % cream Apply topically.   ibandronate  (BONIVA ) 150 mg, Oral, Every 30 days, Take in am with a full glass of water ,  do not  lie down for the next 30 min.   lansoprazole (PREVACID) 15 mg, Daily   meloxicam  (MOBIC ) 15 mg, Oral, Daily PRN, Always take with food   methocarbamol  (ROBAXIN ) 500-1,000 mg, Oral, Every 6 hours PRN   metoprolol  succinate (TOPROL -XL) 12.5 mg, Oral, Daily, Take with or immediately following a meal   Multiple Vitamins-Minerals (PRESERVISION AREDS 2 PO) 2 capsules, Daily   nystatin  cream (MYCOSTATIN ) 1 Application, Topical, 2 times daily   Probiotic Product (PROBIOTIC-10 PO) Take by mouth.   rosuvastatin  (CRESTOR ) 5 mg,  Oral, 4 times weekly   telmisartan  (MICARDIS ) 40 mg, Oral, Daily       Objective:   Physical Exam BP 124/80   Pulse (!) 55   Temp 98 F (36.7 C) (Oral)   Resp 16   Ht 4' 10.5 (1.486 m)   Wt 147 lb 6 oz (66.8 kg)   LMP 05/14/1997   SpO2 98%   BMI 30.28 kg/m  General: Well developed, NAD, BMI noted Neck: No  thyromegaly  HEENT:  Normocephalic . Face symmetric, atraumatic Lungs:  CTA B Normal respiratory effort, no intercostal retractions, no accessory muscle use. Heart: RRR,  no murmur.  Abdomen:  Not  distended, soft, non-tender. No rebound or rigidity.   Lower extremities: no pretibial edema bilaterally  Skin: Exposed areas without rash. Not pale. Not jaundice Neurologic:  alert & oriented X3.  Speech normal, gait unassisted, had some difficulty transferring due to back pain. Strength symmetric and appropriate for age.  Psych: Cognition and judgment appear intact.  Cooperative with normal attention span and concentration.  Behavior appropriate. No anxious or depressed appearing.     Assessment    Problem list HTN Hyperlipidemia (rosuvastatin  4 times a week, intolerant to higher doses) GERD Osteopenia: Per gynecology.  dexa 2016 (per gyn); rx boniva  05-2017  Insomnia  MSK: ---DJD,  NSAIDs prn --Back pain, --Dr Sheena ~ 2011, saw the spine center ~ 2015 , dx w/  spinal stenosis (no MRI, clinical dx ) --2017-2018: PT, injections Dr Freddie Cough-- hycodan prn (tolerates well small doses hydrocodone ) Oncology: --Breast cancer 2003 --Endometrial cancer, H-BSO 01/2015.  Follow-up by gynecology --Melanoma, BCC (Dr Robinson) Lung nodule, 3mm, per CTs, last CT 09/30/2015 : stable  H/o SVT  HOH OSA: Severe, Dx 2023. On Cpap   PLAN: Here for CPX - Td 2024 - PNM 23: 2010, boster 01/2019;  prevnar: 2016   - S/p zostavax;  s/p shingrix.  S/p RSV per patient -Had a flu and  COVID-vaccine  recently -CCS:    Dr Kristie , 2nd cscope  09-2011;   cscope 2018,  pt  reports  she spoke w/ GI office, was rec no further CCS (aged out)  -Female care per gyn; last MMG November 2024 ([facility])  -Lifestyle:  active, eating healthy -Labs:  CMP FLP -Healthcare POA: On file Other issues addressed. HTN: Ambulatory systolic  BP  < 135, continue diltiazem , HCTZ, metoprolol , Micardis .  Check CMP High cholesterol: On Zetia , Crestor , check FLP. Osteopenia, per gynecology, on boniva  to start a holiday soon per pt OSA: Still struggling with a CPAP however she is able to use it 6 hours most nights. Has hydrocodone , uses very seldom for cough. MSK: Having problems with chronic back pain, under the care of Ortho.  Refill methocarbamol . History of SVT: Occasional palpitations, on metoprolol  and CCB's. RTC 6 months

## 2023-06-19 NOTE — Patient Instructions (Addendum)
   Check the  blood pressure regularly Blood pressure goal:  between 110/65 and  135/85. If it is consistently higher or lower, let me know     GO TO THE LAB : Get the blood work     Next visit with me in 6 months    Please schedule it at the front desk

## 2023-06-20 ENCOUNTER — Encounter: Payer: Self-pay | Admitting: Internal Medicine

## 2023-06-20 DIAGNOSIS — M47816 Spondylosis without myelopathy or radiculopathy, lumbar region: Secondary | ICD-10-CM | POA: Diagnosis not present

## 2023-06-20 NOTE — Assessment & Plan Note (Signed)
 Here for CPX  Other issues addressed. HTN: Ambulatory systolic  BP  < 135, continue diltiazem , HCTZ, metoprolol , Micardis .  Check CMP High cholesterol: On Zetia , Crestor , check FLP. Osteopenia, per gynecology, on boniva  to start a holiday soon per pt OSA: Still struggling with a CPAP however she is able to use it 6 hours most nights. Has hydrocodone , uses very seldom for cough. MSK: Having problems with chronic back pain, under the care of Ortho.  Refill methocarbamol . History of SVT: Occasional palpitations, on metoprolol  and CCB's. RTC 6 months

## 2023-06-20 NOTE — Assessment & Plan Note (Signed)
 Here for CPX - Td 2024 - PNM 23: 2010, boster 01/2019;  prevnar: 2016   - S/p zostavax;  s/p shingrix.  S/p RSV per patient -Had a flu and  COVID-vaccine  recently -CCS:    Dr Kristie , 2nd cscope  09-2011;   cscope 2018,  pt reports  she spoke w/ GI office, was rec no further CCS (aged out)  -Female care per gyn; last MMG November 2024 ([facility])  -Lifestyle:  active, eating healthy -Labs:  CMP FLP -Healthcare POA: On file

## 2023-06-28 DIAGNOSIS — H353132 Nonexudative age-related macular degeneration, bilateral, intermediate dry stage: Secondary | ICD-10-CM | POA: Diagnosis not present

## 2023-07-01 ENCOUNTER — Encounter: Payer: Self-pay | Admitting: Neurology

## 2023-07-01 DIAGNOSIS — G4733 Obstructive sleep apnea (adult) (pediatric): Secondary | ICD-10-CM

## 2023-07-07 ENCOUNTER — Other Ambulatory Visit: Payer: Self-pay | Admitting: Internal Medicine

## 2023-07-08 DIAGNOSIS — M8588 Other specified disorders of bone density and structure, other site: Secondary | ICD-10-CM | POA: Diagnosis not present

## 2023-07-08 DIAGNOSIS — Z853 Personal history of malignant neoplasm of breast: Secondary | ICD-10-CM | POA: Diagnosis not present

## 2023-07-10 ENCOUNTER — Encounter: Payer: Self-pay | Admitting: Nurse Practitioner

## 2023-07-11 ENCOUNTER — Encounter: Payer: Self-pay | Admitting: Nurse Practitioner

## 2023-07-11 NOTE — Telephone Encounter (Signed)
 Asked front to schedule pt for OV

## 2023-07-23 ENCOUNTER — Ambulatory Visit (INDEPENDENT_AMBULATORY_CARE_PROVIDER_SITE_OTHER): Payer: PPO | Admitting: Nurse Practitioner

## 2023-07-23 ENCOUNTER — Encounter: Payer: Self-pay | Admitting: Nurse Practitioner

## 2023-07-23 ENCOUNTER — Telehealth: Payer: Self-pay | Admitting: *Deleted

## 2023-07-23 VITALS — BP 130/78 | HR 80 | Ht 58.5 in | Wt 150.0 lb

## 2023-07-23 DIAGNOSIS — M810 Age-related osteoporosis without current pathological fracture: Secondary | ICD-10-CM | POA: Diagnosis not present

## 2023-07-23 DIAGNOSIS — Z9189 Other specified personal risk factors, not elsewhere classified: Secondary | ICD-10-CM | POA: Diagnosis not present

## 2023-07-23 NOTE — Progress Notes (Signed)
   Acute Office Visit  Subjective:    Patient ID: Emily Thompson, female    DOB: December 28, 1945, 78 y.o.   MRN: [id number]   HPI 78 y.o. presents today to discuss DXA 07/08/2023. T-score -2.4, FRAX 17%, 5.5% (T-score -1.8 in 2023). H/O chemo and 5 years of Femara for breast cancer. Was on Boniva for 5 years while taking Femara, then restarted December 2018. She is very active with cardio and resistance training. Normal Ca++ and creatinine 06/19/23.   Patient's last menstrual period was 05/14/1997.    Review of Systems  Constitutional: Negative.        Objective:    Physical Exam Constitutional:      Appearance: Normal appearance.     BP 130/78 (BP Location: Left Arm, Patient Position: Sitting, Cuff Size: Normal)   Pulse 80   Ht 4' 10.5" (1.486 m)   Wt 150 lb (68 kg)   LMP 05/14/1997   SpO2 97%   BMI 30.82 kg/m  Wt Readings from Last 3 Encounters:  07/23/23 150 lb (68 kg)  06/19/23 147 lb 6 oz (66.8 kg)  04/30/23 151 lb 9.6 oz (68.8 kg)        Assessment & Plan:   Problem List Items Addressed This Visit   None Visit Diagnoses       Age-related osteoporosis without current pathological fracture    -  Primary     Fracture Risk Assessment Score (FRAX) indicating greater than 3% risk for hip fracture          Plan: Reviewed DXA and significant decrease in bone density despite Boniva use. Discussed alternative treatments, MOA and potential side effects of each. Would like to start Prolia. Labs UTD. Written education provided. Continue Ca++, Vit D and exercise.     Olivia Mackie DNP, 1:57 PM 07/23/2023

## 2023-07-23 NOTE — Telephone Encounter (Signed)
-----   Message from Olivia Mackie sent at 07/23/2023  1:54 PM EDT ----- Regarding: Prolia start Prolia start for this patient please. Significant decrease in bone density on Boniva. Up to date on Ca++ and creatinine.

## 2023-07-23 NOTE — Telephone Encounter (Signed)
Insurance information submitted to [employer] portal. Will await summary of benefits for prolia.    

## 2023-07-26 ENCOUNTER — Other Ambulatory Visit: Payer: Self-pay | Admitting: Nurse Practitioner

## 2023-07-26 DIAGNOSIS — M810 Age-related osteoporosis without current pathological fracture: Secondary | ICD-10-CM

## 2023-07-26 NOTE — Telephone Encounter (Signed)
 Med refill request: Boniva 150 mg Last OV: 07/23/23 for DEXA consult, Prolia prior authorization in process Last AEX: 04/30/23 Next AEX:  none scheduled Last MMG (if hormonal med) n/a Refill authorized: Boniva 150 mg. Please approve or deny as appropriate.

## 2023-07-30 ENCOUNTER — Encounter: Payer: Self-pay | Admitting: Nurse Practitioner

## 2023-08-21 ENCOUNTER — Ambulatory Visit: Attending: Cardiology | Admitting: Cardiology

## 2023-08-21 ENCOUNTER — Encounter: Payer: Self-pay | Admitting: Cardiology

## 2023-08-21 VITALS — BP 146/72 | HR 63 | Ht 59.0 in | Wt 149.0 lb

## 2023-08-21 DIAGNOSIS — I471 Supraventricular tachycardia, unspecified: Secondary | ICD-10-CM

## 2023-08-21 DIAGNOSIS — I10 Essential (primary) hypertension: Secondary | ICD-10-CM | POA: Diagnosis not present

## 2023-08-21 DIAGNOSIS — E782 Mixed hyperlipidemia: Secondary | ICD-10-CM | POA: Diagnosis not present

## 2023-08-21 NOTE — Patient Instructions (Signed)
Medication Instructions:  Your physician recommends that you continue on your current medications as directed. Please refer to the Current Medication list given to you today.  *If you need a refill on your cardiac medications before your next appointment, please call your pharmacy*   Lab Work: None Ordered If you have labs (blood work) drawn today and your tests are completely normal, you will receive your results only by: MyChart Message (if you have MyChart) OR A paper copy in the mail If you have any lab test that is abnormal or we need to change your treatment, we will call you to review the results.   Testing/Procedures:  WHY IS MY DOCTOR PRESCRIBING ZIO? The Zio system is proven and trusted by physicians to detect and diagnose irregular heart rhythms -- and has been prescribed to hundreds of thousands of patients.  The FDA has cleared the Zio system to monitor for many different kinds of irregular heart rhythms. In a study, physicians were able to reach a diagnosis 90% of the time with the Zio system1.  You can wear the Zio monitor -- a small, discreet, comfortable patch -- during your normal day-to-day activity, including while you sleep, shower, and exercise, while it records every single heartbeat for analysis.  1Barrett, P., et al. Comparison of 24 Hour Holter Monitoring Versus 14 Day Novel Adhesive Patch Electrocardiographic Monitoring. American Journal of Medicine, 2014.  ZIO VS. HOLTER MONITORING The Zio monitor can be comfortably worn for up to 14 days. Holter monitors can be worn for 24 to 48 hours, limiting the time to record any irregular heart rhythms you may have. Zio is able to capture data for the 51% of patients who have their first symptom-triggered arrhythmia after 48 hours.1  LIVE WITHOUT RESTRICTIONS The Zio ambulatory cardiac monitor is a small, unobtrusive, and water-resistant patch--you might even forget you're wearing it. The Zio monitor records and stores  every beat of your heart, whether you're sleeping, working out, or showering.     Follow-Up: At [facility], you and your health needs are our priority.  As part of our continuing mission to provide you with exceptional heart care, we have created designated Provider Care Teams.  These Care Teams include your primary Cardiologist (physician) and Advanced Practice Providers (APPs -  Physician Assistants and Nurse Practitioners) who all work together to provide you with the care you need, when you need it.  We recommend signing up for the patient portal called "MyChart".  Sign up information is provided on this After Visit Summary.  MyChart is used to connect with patients for Virtual Visits (Telemedicine).  Patients are able to view lab/test results, encounter notes, upcoming appointments, etc.  Non-urgent messages can be sent to your provider as well.   To learn more about what you can do with MyChart, go to [email or web address].    Your next appointment:   6 month(s)  The format for your next appointment:   In Person  Provider:   Robert Krasowski, MD    Other Instructions NA  

## 2023-08-21 NOTE — Progress Notes (Signed)
 Cardiology Office Note:    Date:  08/21/2023   ID:  Emily Thompson, DOB 1945/09/30, MRN [id number]  PCP:  Wanda Plump, MD  Cardiologist:  Gypsy Balsam, MD    Referring MD: Wanda Plump, MD   Chief Complaint  Patient presents with   Follow-up    History of Present Illness:    Emily Thompson is a 78 y.o. female past medical history significant for supraventricular tachycardia, dyslipidemia she does have difficulty tolerating statin, extrasystole, essential hypertension comes today for follow-up she says she is doing fine sometimes she got episode of some weakness and fatigue.  She is planning to go to [place] her daughter is getting married and she is scared of flying she asked me if it would be okay to take slightly higher dose of metoprolol for the time.  At the same time she tells me that she does have episode she Momentary sensation like she is going to pass out but never completely passed out.  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     GERD (gastroesophageal reflux disease)    History of breast cancer, left.  March 2003. 12/29/2010   HTN (hypertension) 03/14/2010   Qualifier: Diagnosis of  By: Drue Novel MD, Nolon Rod.    Hyperlipidemia 03/14/2010   Qualifier: Diagnosis of  By: Drue Novel MD, Nolon Rod.    Hypertension    Melanoma (HCC) 2012   righ side face; basal cell (nose)     Menopause 12/25/2010   Near syncope 10/26/2019   Osteopenia    OSTEOPENIA 03/14/2010   Qualifier: Diagnosis of  By: Drue Novel MD, Jose E.    Palpitations 05/20/2013   PCP NOTES >>>>>>>>>>>>>>>>>>>>>>>>>>>>>>>>>>>>> 07/03/2015   Personal history of colonic polyps 07/12/2020   PONV (postoperative nausea and vomiting)    Post-menopausal atrophic vaginitis 12/25/2010   Postmenopausal bleeding 01/03/2015   Scoliosis    Sleep apnea    SVT (supraventricular tachycardia) (HCC)    used to see Dr Clarene Duke    Thickened endometrium 01/05/2015    Past Surgical History:  Procedure Laterality Date   ABDOMINAL HYSTERECTOMY  2016   BASAL CELL CARCINOMA EXCISION     BREAST LUMPECTOMY Left 07/2001   BREAST RECONSTRUCTION  2005   CESAREAN SECTION  1977   COLONOSCOPY W/ POLYPECTOMY  2013   NEXT DUE IN 5 YRS   NM MYOCAR PERF WALL MOTION  01/2006   bruce myoview; no evidence of inducible ischemia, anterior wall thinning without evidence of ischemia; post-stress EF 88%; low risk scan    NOSE SURGERY     basal cell carcinoma removed   R shoulder Arthroscopy and spur removal  02/15/20222   ROBOTIC ASSISTED TOTAL HYSTERECTOMY WITH BILATERAL SALPINGO OOPHERECTOMY Bilateral 01/27/2015   Procedure: ROBOTIC ASSISTED TOTAL HYSTERECTOMY WITH BILATERAL SALPINGO OOPHORECTOMY AND SENTINAL NODE BIOPSY;  Surgeon: Adolphus Birchwood, MD;  Location: [facility];  Service: Gynecology;  Laterality: Bilateral;   SHOULDER SURGERY Right    SKIN CANCER EXCISION     melanoma   TONSILLECTOMY AND ADENOIDECTOMY  1952   TRANSTHORACIC ECHOCARDIOGRAM  12/2005   EF 50-55%; mild MR; trace TR   TUBAL LIGATION  1978   VULVA /PERINEUM BIOPSY     PAPILLOMA    Current Medications: Current Meds  Medication Sig   CALCIUM-VITAMIN D-VITAMIN K PO Take 1,200 mg by mouth daily.   cholecalciferol (VITAMIN D) 1000 units tablet Take 1,000 Units by mouth daily.   diltiazem (TIADYLT ER) 240 MG 24 hr capsule Take 1 capsule (240 mg total) by mouth daily.    estradiol (ESTRACE VAGINAL) 0.1 MG/GM vaginal cream Place 1 g vaginally 2 (two) times a week. Apply externally only   ezetimibe (ZETIA) 10 MG tablet Take 1 tablet (10 mg total) by mouth daily.   hydrochlorothiazide (HYDRODIURIL) 12.5 MG tablet TAKE 1 TABLET BY MOUTH EVERY DAY   HYDROcodone bit-homatropine (HYCODAN) 5-1.5 MG/5ML syrup Take 5 mLs by mouth at bedtime as needed for cough.   hydrocortisone 2.5 % cream Apply topically.   ibandronate (BONIVA) 150 MG tablet TAKE 1 TABLET BY MOUTH ONCE EVERY 30 DAYS WITH A FULL GLASS OF WATER. DO NOT LIE DOWN FOR 30 MINUTES (Patient taking differently: Take 150 mg by mouth every 30 (thirty) days.)   lansoprazole (PREVACID) 15 MG capsule Take 15 mg by mouth daily at 12 noon.   meloxicam (MOBIC) 15 MG tablet Take 1 tablet (15 mg total) by mouth daily as needed for pain. Always take with food   methocarbamol (ROBAXIN) 500 MG tablet Take 1-2 tablets (500-1,000 mg total) by mouth every 6 (six) hours as needed. (Patient taking differently: Take 500-1,000 mg by mouth every 6 (six) hours as needed for muscle spasms.)   metoprolol succinate (TOPROL-XL) 25 MG 24 hr tablet Take 0.5 tablets (12.5 mg total) by mouth daily. Take with or immediately following a meal   Multiple Vitamins-Minerals (PRESERVISION AREDS 2 PO) Take 2 capsules by mouth daily. Unknown strenght   nystatin cream (MYCOSTATIN) Apply 1 Application topically 2 (two) times daily.   Probiotic Product (PROBIOTIC-10 PO) Take by mouth.   rosuvastatin (CRESTOR) 5 MG tablet Take 1 tablet (5 mg total) by mouth 4 (four) times a week.   telmisartan (MICARDIS) 40 MG tablet TAKE 1 TABLET BY MOUTH EVERY DAY   triamcinolone cream (KENALOG) 0.1 % Apply 1 Application topically 2 (two) times daily.     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)    Employer: RETIRED  Tobacco Use   Smoking status: Former    Current packs/day: 0.00    Types: Cigarettes    Start date: 05/21/1963    Quit date: 05/20/1973    Years since quitting: 50.2   Smokeless tobacco: Never  Vaping Use   Vaping status: Never Used  Substance and Sexual Activity   Alcohol use: Yes    Comment: occ   Drug use: No   Sexual activity: Not Currently    Birth control/protection: Post-menopausal    Comment: First IC >57 y/o, Partners <5  Other Topics Concern  Not on file  Social History Narrative   Widow, 1 adopted and 1 natural child; ([place], [place])   Live by herself   Lost her long-term companion Mattie Marlin  09/11/2021   Social Drivers of Health   Financial Resource Strain: Low Risk  (07/31/2022)   Overall Financial Resource Strain (CARDIA)    Difficulty of Paying Living Expenses: Not hard at all  Food Insecurity: No Food Insecurity (07/31/2022)   Hunger Vital Sign    Worried About Running Out of Food in the Last Year: Never true    Ran Out of Food in the Last Year: Never true  Transportation Needs: No Transportation Needs (07/31/2022)   PRAPARE - Administrator, Civil Service (Medical): No    Lack of Transportation (Non-Medical): No  Physical Activity: Not on file  Stress: Not on file  Social Connections: Not on file     Family History: The patient's family history includes Bipolar disorder in her brother; Colon cancer in her maternal aunt; Diabetes in her brother; Hypertension in her father and mother; Stroke in her father; Thyroid disease in her mother. There is no history of Breast cancer. ROS:   Please see the history of present illness.    All 14 point review of systems negative except as described per history of present illness  EKGs/Labs/Other Studies Reviewed:         Recent Labs: 12/05/2022:  Hemoglobin 13.3; Platelets 262.0 06/19/2023: ALT 13; BUN 12; Creatinine, Ser 0.71; Potassium 4.0; Sodium 140  Recent Lipid Panel    Component Value Date/Time   CHOL 174 06/19/2023 0930   TRIG 74.0 06/19/2023 0930   HDL 74.30 06/19/2023 0930   CHOLHDL 2 06/19/2023 0930   VLDL 14.8 06/19/2023 0930   LDLCALC 85 06/19/2023 0930   LDLCALC 114 (H) 05/17/2020 0752   LDLDIRECT 114.7 07/28/2012 0823    Physical Exam:    VS:  BP (!) 146/72 (BP Location: Left Arm, Patient Position: Sitting)   Pulse 63   Ht 4\' 11"  (1.499 m)   Wt 149 lb (67.6 kg)   LMP 05/14/1997   SpO2 94%   BMI 30.09 kg/m     Wt Readings from Last 3 Encounters:  08/21/23 149 lb (67.6 kg)  07/23/23 150 lb (68 kg)  06/19/23 147 lb 6 oz (66.8 kg)     GEN:  Well nourished, well developed in no acute distress HEENT: Normal NECK: No JVD; No carotid bruits LYMPHATICS: No lymphadenopathy CARDIAC: RRR, no murmurs, no rubs, no gallops RESPIRATORY:  Clear to auscultation without rales, wheezing or rhonchi  ABDOMEN: Soft, non-tender, non-distended MUSCULOSKELETAL:  No edema; No deformity  SKIN: Warm and dry LOWER EXTREMITIES: no swelling NEUROLOGIC:  Alert and oriented x 3 PSYCHIATRIC:  Normal affect   ASSESSMENT:    1. Primary hypertension   2. SVT (supraventricular tachycardia) (HCC)   3. Mixed hyperlipidemia    PLAN:    In order of problems listed above:  Palpitations; asking to wear Zio patch for 2 weeks first week ask her to take regular dose of metoprolol and second week to try higher dose see how she feels and then will watch what we got on the monitor. Essential hypertension blood pressure recently controlled continue present management. Dyslipidemia difficulty tolerating statin she is taking Zetia right now as well as Crestor 5 LDL 85 HDL 74 acceptable we will continue. Supraventricular tachycardia denies having any   Medication Adjustments/Labs and Tests Ordered: Current medicines are reviewed at length  with the patient today.  Concerns regarding medicines are outlined above.  Orders Placed This Encounter  Procedures   EKG 12-Lead   Medication changes: No orders of the defined types were placed in this encounter.   Signed, Georgeanna Lea, MD, [facility] 08/21/2023 3:29 PM    [facility]

## 2023-08-21 NOTE — Addendum Note (Signed)
 Addended by: Baldo Ash D on: 08/21/2023 03:52 PM   Modules accepted: Orders

## 2023-08-22 DIAGNOSIS — G4733 Obstructive sleep apnea (adult) (pediatric): Secondary | ICD-10-CM | POA: Diagnosis not present

## 2023-09-02 ENCOUNTER — Encounter: Payer: Self-pay | Admitting: Cardiology

## 2023-09-03 ENCOUNTER — Other Ambulatory Visit: Payer: Self-pay

## 2023-09-03 ENCOUNTER — Ambulatory Visit: Attending: Cardiology

## 2023-09-03 DIAGNOSIS — I471 Supraventricular tachycardia, unspecified: Secondary | ICD-10-CM

## 2023-09-04 ENCOUNTER — Encounter: Payer: Self-pay | Admitting: Nurse Practitioner

## 2023-09-10 MED ORDER — DENOSUMAB 60 MG/ML ~~LOC~~ SOSY
60.0000 mg | PREFILLED_SYRINGE | Freq: Once | SUBCUTANEOUS | Status: AC
Start: 2023-09-25 — End: 2023-09-25
  Administered 2023-09-25: 60 mg via SUBCUTANEOUS

## 2023-09-10 NOTE — Telephone Encounter (Signed)
 Deductible:  None   OOP MAX: $3400 ($191.91 met)   OV: 07/23/23 TW  Calcium :     9.4       Date: 06/19/23  Upcoming dental procedures: No   Hx of Kidney Disease: No   Last Bone Density Scan: 07/08/23  Is Prior Authorization needed: no  Pt estimated Cost: $296.80  Coverage Details: Call to [employer], spoke with Ardelia Beau who states patient is responsible for 20% coinsurance for prolia, plus $20 copay for specialty office visit. No deductible applies. OOP max is $3400 and patient has met $191.91. Reference #: [id number].

## 2023-09-10 NOTE — Telephone Encounter (Signed)
 Call to patient. Patient agreeable to schedule prolia injection. Patient scheduled for 09/25/23 at 1345. Patient agreeable to date and time of appointment. Order placed. Summary of benefits scanned into Epic. Encounter closed.

## 2023-09-24 DIAGNOSIS — I471 Supraventricular tachycardia, unspecified: Secondary | ICD-10-CM | POA: Diagnosis not present

## 2023-09-25 ENCOUNTER — Ambulatory Visit (INDEPENDENT_AMBULATORY_CARE_PROVIDER_SITE_OTHER)

## 2023-09-25 DIAGNOSIS — M810 Age-related osteoporosis without current pathological fracture: Secondary | ICD-10-CM

## 2023-09-30 DIAGNOSIS — M47816 Spondylosis without myelopathy or radiculopathy, lumbar region: Secondary | ICD-10-CM | POA: Diagnosis not present

## 2023-10-01 ENCOUNTER — Other Ambulatory Visit: Payer: Self-pay | Admitting: Internal Medicine

## 2023-10-03 ENCOUNTER — Other Ambulatory Visit: Payer: Self-pay | Admitting: Internal Medicine

## 2023-10-10 DIAGNOSIS — M47816 Spondylosis without myelopathy or radiculopathy, lumbar region: Secondary | ICD-10-CM | POA: Diagnosis not present

## 2023-10-14 ENCOUNTER — Other Ambulatory Visit: Payer: Self-pay | Admitting: Radiology

## 2023-10-14 DIAGNOSIS — I471 Supraventricular tachycardia, unspecified: Secondary | ICD-10-CM | POA: Diagnosis not present

## 2023-10-14 DIAGNOSIS — M810 Age-related osteoporosis without current pathological fracture: Secondary | ICD-10-CM

## 2023-10-15 NOTE — Telephone Encounter (Signed)
 Med refill request: Boniva  Last AEX: 04/30/23 TW Next AEX: not yet scheduled Last MMG (if hormonal med) 01/23/23 Refill authorized: Last Rx sent #3 with zero refills on 07/26/23 JC. Please approve or deny

## 2023-10-17 ENCOUNTER — Other Ambulatory Visit: Payer: Self-pay | Admitting: Internal Medicine

## 2023-10-25 ENCOUNTER — Telehealth: Payer: Self-pay

## 2023-10-25 ENCOUNTER — Ambulatory Visit: Payer: Self-pay | Admitting: Cardiology

## 2023-10-25 NOTE — Telephone Encounter (Signed)
Left message on My Chart with monitor results per Dr. Vanetta Shawl note. Routed to PCP.

## 2023-10-29 ENCOUNTER — Telehealth: Payer: Self-pay

## 2023-10-29 NOTE — Telephone Encounter (Signed)
 Pt viewed Monitor results on My Chart per Dr. Vanetta Shawl note. Routed to PCP.

## 2023-11-11 ENCOUNTER — Telehealth: Payer: Self-pay

## 2023-11-11 ENCOUNTER — Ambulatory Visit (INDEPENDENT_AMBULATORY_CARE_PROVIDER_SITE_OTHER): Admitting: Internal Medicine

## 2023-11-11 VITALS — BP 132/78 | HR 70 | Temp 98.3°F | Resp 16 | Ht 59.0 in | Wt 149.4 lb

## 2023-11-11 DIAGNOSIS — J9801 Acute bronchospasm: Secondary | ICD-10-CM | POA: Diagnosis not present

## 2023-11-11 DIAGNOSIS — J40 Bronchitis, not specified as acute or chronic: Secondary | ICD-10-CM | POA: Diagnosis not present

## 2023-11-11 MED ORDER — FLUTICASONE-SALMETEROL 100-50 MCG/ACT IN AEPB: 1.0000 | INHALATION_SPRAY | Freq: Two times a day (BID) | RESPIRATORY_TRACT | 2 refills | Status: DC

## 2023-11-11 MED ORDER — BUDESONIDE-FORMOTEROL FUMARATE 160-4.5 MCG/ACT IN AERO: 2.0000 | INHALATION_SPRAY | Freq: Three times a day (TID) | RESPIRATORY_TRACT | 3 refills | Status: DC | PRN

## 2023-11-11 MED ORDER — AZITHROMYCIN 250 MG PO TABS: ORAL_TABLET | ORAL | 0 refills | Status: DC

## 2023-11-11 NOTE — Assessment & Plan Note (Signed)
 Bronchitis, bronchospasm: Patient is 78 years old, non-smoker, no history of asthma presents with what seems to be bronchitis and bronchospasm. Discussed prednisone, she would like to avoid oral steroids if possible. Plan: Fluids, Zithromax , Symbicort 3 times daily, Robitussin DM, Hycodan (which she has) call if not gradually better. Is Symbicort is expensive, switch to albuterol and prednisone.

## 2023-11-11 NOTE — Telephone Encounter (Signed)
Spoke w/ Pt- informed of recommendations. Pt verbalized understanding.  

## 2023-11-11 NOTE — Telephone Encounter (Signed)
 Copied from CRM [id number]. Topic: Clinical - Prescription Issue >> Nov 11, 2023  3:08 PM Drema MATSU wrote: Reason for CRM: Patient stated that the budesonide-formoterol (SYMBICORT) 160-4.5 MCG/ACT inhaler Dr. Amon Prescribed is not covered under insurance. Patient insurance gave her two alternatives Foupicasone Propion Salnetrol and Wixela Inhub.

## 2023-11-11 NOTE — Patient Instructions (Signed)
 Drink plenty of fluids  Zithromax , an antibiotic for 5 days  Start Symbicort: 2 puffs twice daily.  Okay to take extra 2 puffs if needed.  Consistent use of Robitussin DM or Mucinex DM for the next few days  Okay to continue using Hycodan syrup at bedtime if needed  Call if not gradually better  (If Symbicort is not covered, we can always try prednisone and albuterol).

## 2023-11-11 NOTE — Progress Notes (Signed)
 Subjective:    Patient ID: Emily Thompson, female    DOB: [birth year], 78 y.o.   MRN: [id number]  DOS:  11/11/2023 Type of visit - description: Acute  Symptoms started very mild 10 days ago, they are gradually getting worse. The main problem is cough which is mostly dry. + Chest congestion and wheezing.  Denies fever or chills. Has mild sinus congestion. No nausea or vomiting.  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     GERD (gastroesophageal reflux disease)    History of breast cancer, left.  March 2003. 12/29/2010   HTN (hypertension) 03/14/2010   Qualifier: Diagnosis of  By: Amon MD, Aloysius BRAVO.    Hyperlipidemia 03/14/2010   Qualifier: Diagnosis of  By: Amon MD, Aloysius BRAVO.    Hypertension    Melanoma (HCC) 2012   righ side face; basal cell (nose)    Menopause 12/25/2010   Near syncope 10/26/2019   Osteopenia    OSTEOPENIA 03/14/2010   Qualifier: Diagnosis of  By: Amon MD, Yaslyn Cumby E.    Palpitations 05/20/2013   PCP NOTES >>>>>>>>>>>>>>>>>>>>>>>>>>>>>>>>>>>>> 07/03/2015   Personal history of colonic polyps 07/12/2020   PONV (postoperative nausea and vomiting)    Post-menopausal atrophic vaginitis 12/25/2010   Postmenopausal bleeding 01/03/2015   Scoliosis    Sleep apnea    SVT (supraventricular tachycardia) (HCC)     used to see Dr Morgan    Thickened endometrium 01/05/2015    Past Surgical History:  Procedure Laterality Date   ABDOMINAL HYSTERECTOMY  2016   BASAL CELL CARCINOMA EXCISION     BREAST LUMPECTOMY Left 07/2001   BREAST RECONSTRUCTION  2005   CESAREAN SECTION  1977   COLONOSCOPY W/ POLYPECTOMY  2013   NEXT DUE IN 5 YRS   NM MYOCAR PERF WALL MOTION  01/2006   bruce myoview; no evidence of inducible ischemia, anterior wall thinning without evidence of ischemia; post-stress EF 88%; low risk scan    NOSE SURGERY     basal cell carcinoma removed   R shoulder Arthroscopy and spur removal  02/15/20222   ROBOTIC ASSISTED TOTAL HYSTERECTOMY WITH BILATERAL SALPINGO OOPHERECTOMY Bilateral 01/27/2015   Procedure: ROBOTIC ASSISTED TOTAL HYSTERECTOMY WITH BILATERAL SALPINGO OOPHORECTOMY AND SENTINAL NODE BIOPSY;  Surgeon: Maurilio Ship, MD;  Location: [facility];  Service: Gynecology;  Laterality: Bilateral;   SHOULDER SURGERY Right    SKIN CANCER EXCISION     melanoma   TONSILLECTOMY AND ADENOIDECTOMY  1952   TRANSTHORACIC ECHOCARDIOGRAM  12/2005   EF 50-55%; mild MR; trace TR   TUBAL LIGATION  1978   VULVA /PERINEUM BIOPSY     PAPILLOMA    Current Outpatient Medications  Medication Instructions   azithromycin  (ZITHROMAX  Z-PAK) 250 MG tablet 2 tabs a day the first day, then 1 tab  a day x 4 days   budesonide-formoterol (SYMBICORT) 160-4.5 MCG/ACT inhaler 2 puffs, Inhalation, 3 times daily PRN   CALCIUM -VITAMIN D -VITAMIN K PO 1,200 mg, Daily   cholecalciferol (VITAMIN D ) 1,000 Units, Daily   denosumab  (PROLIA ) 60 mg, Every 6 months   diltiazem  (TIADYLT  ER) 240 mg, Oral, Daily   estradiol  (ESTRACE  VAGINAL) 1 g, Vaginal, 2 times weekly, Apply externally only   ezetimibe  (ZETIA ) 10 mg, Oral, Daily   hydrochlorothiazide  (HYDRODIURIL ) 12.5 mg, Oral, Daily   HYDROcodone  bit-homatropine (HYCODAN) 5-1.5 MG/5ML syrup 5 mLs, Oral, At bedtime PRN   hydrocortisone 2.5 % cream Apply topically.   lansoprazole  (PREVACID) 15 mg, Daily   meloxicam  (MOBIC ) 15 mg, Oral, Daily PRN, Always take with food   methocarbamol  (ROBAXIN ) 500-1,000 mg, Oral, Every 6 hours PRN   metoprolol  succinate (TOPROL -XL) 12.5 mg, Oral, Daily, Take with or immediately following a meal   Multiple Vitamins-Minerals (PRESERVISION AREDS 2 PO) 2 capsules, Daily   nystatin  cream (MYCOSTATIN ) 1 Application, Topical, 2 times daily   Probiotic Product (PROBIOTIC-10 PO) Take by mouth.   rosuvastatin  (CRESTOR ) 5 mg, Oral, 4 times weekly   telmisartan  (MICARDIS ) 40 mg, Oral, Daily   triamcinolone cream (KENALOG) 0.1 % 1 Application, 2 times daily       Objective:   Physical Exam BP 132/78   Pulse 70   Temp 98.3 F (36.8 C) (Oral)   Resp 16   Ht 4' 11 (1.499 m)   Wt 149 lb 6 oz (67.8 kg)   LMP 05/14/1997   SpO2 95%   BMI 30.17 kg/m  General:   Well developed, NAD, BMI noted. HEENT:  Normocephalic . Face symmetric, atraumatic. TMs: Slightly bulged bilaterally, normal rate. No stridor Lungs:  Wheezing throughout, few rhonchi, no crackles Normal respiratory effort, no intercostal retractions, no accessory muscle use. Heart: RRR,  no murmur.  Lower extremities: no pretibial edema bilaterally  Skin: Not pale. Not jaundice Neurologic:  alert & oriented X3.  Speech normal, gait appropriate for age and unassisted Psych--  Cognition and judgment appear intact.  Cooperative with normal attention span and concentration.  Behavior appropriate. No anxious or depressed appearing.      Assessment     Problem list HTN Hyperlipidemia (rosuvastatin  4 times a week, intolerant to higher doses) GERD Osteopenia: Per gynecology.  dexa 2016 (per gyn); rx boniva  05-2017  Insomnia  MSK: ---DJD,  NSAIDs prn --Back pain, --Dr Sheena ~ 2011, saw the [facility] ~ 2015 , dx w/  spinal stenosis (no MRI, clinical dx ) --2017-2018: PT, injections Dr Freddie Cough-- hycodan prn (tolerates well small doses  hydrocodone ) Oncology: --Breast cancer 2003 --Endometrial cancer, H-BSO 01/2015.  Follow-up by gynecology --Melanoma, BCC (Dr Robinson) Lung nodule, 3mm, per CTs, last CT 09/30/2015 : stable  H/o SVT  HOH OSA: Severe, Dx 2023. On Cpap   PLAN: Bronchitis, bronchospasm: Patient is 78 years old, non-smoker, no history of asthma presents with what seems to be bronchitis and bronchospasm. Discussed prednisone, she would like to avoid oral steroids if possible. Plan: Fluids, Zithromax , Symbicort 3 times daily, Robitussin DM, Hycodan (which she has) call if not gradually better. Is Symbicort is expensive, switch to albuterol and prednisone.

## 2023-11-11 NOTE — Telephone Encounter (Signed)
 Call  patient  I sent a different medication called Wixela 1 puff twice daily. The mechanism is different from a inhaler, please read the instructions on how to use the delivery system carefully.

## 2023-11-18 DIAGNOSIS — M47816 Spondylosis without myelopathy or radiculopathy, lumbar region: Secondary | ICD-10-CM | POA: Diagnosis not present

## 2023-11-22 DIAGNOSIS — G4733 Obstructive sleep apnea (adult) (pediatric): Secondary | ICD-10-CM | POA: Diagnosis not present

## 2023-12-01 ENCOUNTER — Other Ambulatory Visit: Payer: Self-pay | Admitting: Internal Medicine

## 2023-12-17 ENCOUNTER — Ambulatory Visit (INDEPENDENT_AMBULATORY_CARE_PROVIDER_SITE_OTHER): Payer: PPO | Admitting: Internal Medicine

## 2023-12-17 ENCOUNTER — Encounter: Payer: Self-pay | Admitting: Internal Medicine

## 2023-12-17 VITALS — BP 130/82 | HR 78 | Temp 98.2°F | Resp 16 | Ht 59.0 in | Wt 149.2 lb

## 2023-12-17 DIAGNOSIS — E782 Mixed hyperlipidemia: Secondary | ICD-10-CM

## 2023-12-17 DIAGNOSIS — I10 Essential (primary) hypertension: Secondary | ICD-10-CM | POA: Diagnosis not present

## 2023-12-17 LAB — CBC WITH DIFFERENTIAL/PLATELET
Basophils Absolute: 0.1 K/uL (ref 0.0–0.1)
Basophils Relative: 0.9 % (ref 0.0–3.0)
Eosinophils Absolute: 0.4 K/uL (ref 0.0–0.7)
Eosinophils Relative: 5.3 % — ABNORMAL HIGH (ref 0.0–5.0)
HCT: 39.9 % (ref 36.0–46.0)
Hemoglobin: 13 g/dL (ref 12.0–15.0)
Lymphocytes Relative: 13.9 % (ref 12.0–46.0)
Lymphs Abs: 1 K/uL (ref 0.7–4.0)
MCHC: 32.7 g/dL (ref 30.0–36.0)
MCV: 91.2 fl (ref 78.0–100.0)
Monocytes Absolute: 0.8 K/uL (ref 0.1–1.0)
Monocytes Relative: 11.6 % (ref 3.0–12.0)
Neutro Abs: 4.7 K/uL (ref 1.4–7.7)
Neutrophils Relative %: 68.3 % (ref 43.0–77.0)
Platelets: 246 K/uL (ref 150.0–400.0)
RBC: 4.37 Mil/uL (ref 3.87–5.11)
RDW: 14 % (ref 11.5–15.5)
WBC: 6.9 K/uL (ref 4.0–10.5)

## 2023-12-17 LAB — BASIC METABOLIC PANEL WITH GFR
BUN: 16 mg/dL (ref 6–23)
CO2: 33 meq/L — ABNORMAL HIGH (ref 19–32)
Calcium: 9.6 mg/dL (ref 8.4–10.5)
Chloride: 102 meq/L (ref 96–112)
Creatinine, Ser: 0.77 mg/dL (ref 0.40–1.20)
GFR: 73.06 mL/min (ref >60.00)
Glucose, Bld: 86 mg/dL (ref 70–99)
Potassium: 4.3 meq/L (ref 3.5–5.1)
Sodium: 141 meq/L (ref 135–145)

## 2023-12-17 NOTE — Patient Instructions (Signed)
 Vaccines are recommended: Flu shot and a COVID booster this fall  Check the  blood pressure regularly Blood pressure goal:  between 110/65 and  135/85. If your blood pressure is slightly high, sit down, get a glass of water  and check again. If it is consistently higher or lower, let me know     GO TO THE LAB :  Get the blood work   Your results will be posted on MyChart with my comments  Next office visit for a physical exam by 06-2024 Please make an appointment before you leave today

## 2023-12-17 NOTE — Assessment & Plan Note (Signed)
 Bronchitis, bronchospasm: See LOV, feeling better, has Wixela just in case. HTN: Ambulatory BPs typically okay, occasionally in the 150s.  Recommend to continue checking, see AVS. On diltiazem , HCTZ, metoprolol , Micardis .  Check a BMP and CBC Hyperlipidemia: On rosuvastatin  4 times a week and Zetia .  Last LDL 85.  No change. MSK: Stopped meloxicam , no help, sees Ortho, takes a muscle relaxant as needed. Osteopenia: Per gynecology, took Boniva  from 2019-2025, now on Prolia . OSA: Better compliance with CPAP, using it every night for 4 to 6 hours Vaccine advice provided RTC 6 months CPX.  Sooner if needed.

## 2023-12-17 NOTE — Progress Notes (Signed)
 Subjective:    Patient ID: Emily Thompson, female    DOB: 25-Jun-1945, 78 y.o.   MRN: [id number]  DOS:  12/17/2023 Type of visit - description: Follow-up  Chronic problems addressed Was recently sick with bronchitis.  Feeling better. Ambulatory BPs occasionally elevated.   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     GERD (gastroesophageal reflux disease)    History of breast cancer, left.  March 2003. 12/29/2010   HTN (hypertension) 03/14/2010   Qualifier: Diagnosis of  By: Amon MD, Aloysius BRAVO.    Hyperlipidemia 03/14/2010   Qualifier: Diagnosis of  By: Amon MD, Aloysius BRAVO.    Hypertension    Melanoma (HCC) 2012   righ side face; basal cell (nose)    Menopause 12/25/2010   Near syncope 10/26/2019   Osteopenia    OSTEOPENIA 03/14/2010   Qualifier: Diagnosis of  By: Amon MD, Woodfin Kiss E.    Palpitations 05/20/2013   PCP NOTES >>>>>>>>>>>>>>>>>>>>>>>>>>>>>>>>>>>>> 07/03/2015   Personal history of colonic polyps 07/12/2020   PONV (postoperative nausea and vomiting)    Post-menopausal atrophic vaginitis 12/25/2010   Postmenopausal bleeding 01/03/2015   Scoliosis    Sleep apnea    SVT (supraventricular tachycardia) (HCC)    used to see Dr Morgan    Thickened endometrium 01/05/2015    Past Surgical History:  Procedure Laterality Date   ABDOMINAL  HYSTERECTOMY  2016   BASAL CELL CARCINOMA EXCISION     BREAST LUMPECTOMY Left 07/2001   BREAST RECONSTRUCTION  2005   CESAREAN SECTION  1977   COLONOSCOPY W/ POLYPECTOMY  2013   NEXT DUE IN 5 YRS   NM MYOCAR PERF WALL MOTION  01/2006   bruce myoview; no evidence of inducible ischemia, anterior wall thinning without evidence of ischemia; post-stress EF 88%; low risk scan    NOSE SURGERY     basal cell carcinoma removed   R shoulder Arthroscopy and spur removal  02/15/20222   ROBOTIC ASSISTED TOTAL HYSTERECTOMY WITH BILATERAL SALPINGO OOPHERECTOMY Bilateral 01/27/2015   Procedure: ROBOTIC ASSISTED TOTAL HYSTERECTOMY WITH BILATERAL SALPINGO OOPHORECTOMY AND SENTINAL NODE BIOPSY;  Surgeon: Maurilio Ship, MD;  Location: [facility];  Service: Gynecology;  Laterality: Bilateral;   SHOULDER SURGERY Right    SKIN CANCER EXCISION     melanoma   TONSILLECTOMY AND ADENOIDECTOMY  1952   TRANSTHORACIC ECHOCARDIOGRAM  12/2005   EF 50-55%; mild MR; trace TR   TUBAL LIGATION  1978   VULVA /PERINEUM BIOPSY     PAPILLOMA    Current Outpatient Medications  Medication Instructions   CALCIUM -VITAMIN D -VITAMIN K PO 1,200 mg, Daily   cholecalciferol (VITAMIN D ) 1,000 Units, Daily   denosumab  (PROLIA ) 60 mg, Every 6 months   diltiazem  (TIADYLT  ER) 240 mg, Oral, Daily   estradiol  (ESTRACE  VAGINAL) 1 g, Vaginal, 2 times weekly, Apply  externally only   ezetimibe  (ZETIA ) 10 mg, Oral, Daily   fluticasone -salmeterol (WIXELA INHUB) 100-50 MCG/ACT AEPB 1 puff, Inhalation, 2 times daily   hydrochlorothiazide  (HYDRODIURIL ) 12.5 mg, Oral, Daily   HYDROcodone  bit-homatropine (HYCODAN) 5-1.5 MG/5ML syrup 5 mLs, Oral, At bedtime PRN   hydrocortisone 2.5 % cream Apply topically.   lansoprazole (PREVACID) 15 mg, Daily   methocarbamol  (ROBAXIN ) 500-1,000 mg, Oral, Every 6 hours PRN   metoprolol  succinate (TOPROL -XL) 12.5 mg, Oral, Daily, Take with or immediately following a meal   Multiple Vitamins-Minerals (PRESERVISION  AREDS 2 PO) 2 capsules, Daily   nystatin  cream (MYCOSTATIN ) 1 Application, Topical, 2 times daily   Probiotic Product (PROBIOTIC-10 PO) Take by mouth.   rosuvastatin  (CRESTOR ) 5 mg, Oral, 4 times weekly   telmisartan  (MICARDIS ) 40 mg, Oral, Daily   triamcinolone cream (KENALOG) 0.1 % 1 Application, 2 times daily       Objective:   Physical Exam BP 130/82   Pulse 78   Temp 98.2 F (36.8 C) (Oral)   Resp 16   Ht 4' 11 (1.499 m)   Wt 149 lb 4 oz (67.7 kg)   LMP 05/14/1997   SpO2 97%   BMI 30.14 kg/m  General:   Well developed, NAD, BMI noted. HEENT:  Normocephalic . Face symmetric, atraumatic Lungs:  CTA B Normal respiratory effort, no intercostal retractions, no accessory muscle use. Heart: RRR,  no murmur.  Lower extremities: no pretibial edema bilaterally  Skin: Not pale. Not jaundice Neurologic:  alert & oriented X3.  Speech normal, gait appropriate for age and unassisted Psych--  Cognition and judgment appear intact.  Cooperative with normal attention span and concentration.  Behavior appropriate. No anxious or depressed appearing.      Assessment      Problem list HTN Hyperlipidemia (rosuvastatin  4 times a week, intolerant to higher doses) GERD Osteopenia: Per gynecology.  dexa 2016 (per gyn); rx boniva  05-2017  Insomnia  MSK: ---DJD,  NSAIDs prn --Back pain, --2017-2018: PT, injections Dr Freddie Cough-- hycodan prn (tolerates well small doses hydrocodone ) Oncology: --Breast cancer 2003 --Endometrial cancer, H-BSO 01/2015.  Follow-up by gynecology --Melanoma, BCC (Dr Robinson)  [facility] OSA: Severe, Dx 2023. On Cpap Lung nodule, 3mm, per CTs, last CT 09/30/2015 : stable  H/o SVT  PLAN: Bronchitis, bronchospasm: See LOV, feeling better, has Wixela just in case. HTN: Ambulatory BPs typically okay, occasionally in the 150s.  Recommend to continue checking, see AVS. On diltiazem , HCTZ, metoprolol , Micardis .  Check a BMP and CBC Hyperlipidemia: On  rosuvastatin  4 times a week and Zetia .  Last LDL 85.  No change. MSK: Stopped meloxicam , no help, sees Ortho, takes a muscle relaxant as needed. Osteopenia: Per gynecology, took Boniva  from 2019-2025, now on Prolia . OSA: Better compliance with CPAP, using it every night for 4 to 6 hours Vaccine advice provided RTC 6 months CPX.  Sooner if needed.

## 2023-12-19 ENCOUNTER — Ambulatory Visit: Payer: Self-pay | Admitting: Internal Medicine

## 2023-12-27 DIAGNOSIS — H353132 Nonexudative age-related macular degeneration, bilateral, intermediate dry stage: Secondary | ICD-10-CM | POA: Diagnosis not present

## 2024-01-01 ENCOUNTER — Other Ambulatory Visit: Payer: Self-pay | Admitting: Internal Medicine

## 2024-01-17 ENCOUNTER — Telehealth: Payer: Self-pay | Admitting: Internal Medicine

## 2024-01-17 NOTE — Telephone Encounter (Signed)
 See my chart message

## 2024-01-17 NOTE — Telephone Encounter (Signed)
 Copied from CRM [id number]. Topic: Clinical - Medical Advice >> Jan 17, 2024  2:34 PM Jayma L wrote: Reason for CRM: patient called in asking if it was ok for her to get the new covid vaccine that just came out, please call her back and advise

## 2024-01-24 MED ORDER — COVID-19 MRNA VAC-TRIS(PFIZER) 30 MCG/0.3ML IM SUSY: 0.3000 mL | PREFILLED_SYRINGE | Freq: Once | INTRAMUSCULAR | 0 refills | Status: AC

## 2024-01-24 NOTE — Addendum Note (Signed)
 Addended by: Javarus Dorner D on: 01/24/2024 09:01 AM   Modules accepted: Orders

## 2024-01-28 DIAGNOSIS — Z1231 Encounter for screening mammogram for malignant neoplasm of breast: Secondary | ICD-10-CM | POA: Diagnosis not present

## 2024-01-28 LAB — HM MAMMOGRAPHY

## 2024-01-30 DIAGNOSIS — Z85828 Personal history of other malignant neoplasm of skin: Secondary | ICD-10-CM | POA: Diagnosis not present

## 2024-01-30 DIAGNOSIS — C44511 Basal cell carcinoma of skin of breast: Secondary | ICD-10-CM | POA: Diagnosis not present

## 2024-01-30 DIAGNOSIS — L578 Other skin changes due to chronic exposure to nonionizing radiation: Secondary | ICD-10-CM | POA: Diagnosis not present

## 2024-01-30 DIAGNOSIS — D485 Neoplasm of uncertain behavior of skin: Secondary | ICD-10-CM | POA: Diagnosis not present

## 2024-01-30 DIAGNOSIS — Z86006 Personal history of melanoma in-situ: Secondary | ICD-10-CM | POA: Diagnosis not present

## 2024-01-30 DIAGNOSIS — D225 Melanocytic nevi of trunk: Secondary | ICD-10-CM | POA: Diagnosis not present

## 2024-01-30 DIAGNOSIS — C44712 Basal cell carcinoma of skin of right lower limb, including hip: Secondary | ICD-10-CM | POA: Diagnosis not present

## 2024-01-30 DIAGNOSIS — L821 Other seborrheic keratosis: Secondary | ICD-10-CM | POA: Diagnosis not present

## 2024-01-30 DIAGNOSIS — R202 Paresthesia of skin: Secondary | ICD-10-CM | POA: Diagnosis not present

## 2024-02-03 ENCOUNTER — Encounter: Payer: Self-pay | Admitting: Nurse Practitioner

## 2024-03-09 DIAGNOSIS — C44712 Basal cell carcinoma of skin of right lower limb, including hip: Secondary | ICD-10-CM | POA: Diagnosis not present

## 2024-03-13 ENCOUNTER — Other Ambulatory Visit: Payer: Self-pay | Admitting: Internal Medicine

## 2024-03-18 ENCOUNTER — Other Ambulatory Visit: Payer: Self-pay | Admitting: *Deleted

## 2024-03-18 ENCOUNTER — Encounter: Payer: Self-pay | Admitting: *Deleted

## 2024-03-18 DIAGNOSIS — M810 Age-related osteoporosis without current pathological fracture: Secondary | ICD-10-CM

## 2024-03-18 MED ORDER — DENOSUMAB 60 MG/ML ~~LOC~~ SOSY: 60.0000 mg | PREFILLED_SYRINGE | SUBCUTANEOUS | Status: DC

## 2024-03-18 MED ORDER — DENOSUMAB 60 MG/ML ~~LOC~~ SOSY
60.0000 mg | PREFILLED_SYRINGE | Freq: Once | SUBCUTANEOUS | 0 refills | Status: DC
  Filled 2024-03-30: qty 1, 1d supply, fill #0

## 2024-03-19 ENCOUNTER — Telehealth: Payer: Self-pay

## 2024-03-19 NOTE — Telephone Encounter (Signed)
 Prolia  VOB initiated via [email or web address]  Next Prolia  inj DUE: 03/27/24

## 2024-03-20 ENCOUNTER — Other Ambulatory Visit (HOSPITAL_COMMUNITY): Payer: Self-pay

## 2024-03-20 NOTE — Telephone Encounter (Signed)
 Proceed with MTDM services  Med obtained from pharmacy and shipped to clinic: - [id number]  Pharmacy benefit: Copay $250 (Paid to pharmacy) Admin Fee: 0% (Pay at clinic)  Prior Auth: N/A PA# Expiration Date:   # of doses approved:   Patient NOT eligible for Copay Card. Copay Card can make patient's cost as little as $25. Link to apply: [email or web address]  ** This summary of benefits is an estimation of the patient's out-of-pocket cost. Exact cost may very based on individual plan coverage.

## 2024-03-23 DIAGNOSIS — C44511 Basal cell carcinoma of skin of breast: Secondary | ICD-10-CM | POA: Diagnosis not present

## 2024-03-30 ENCOUNTER — Other Ambulatory Visit: Payer: Self-pay | Admitting: *Deleted

## 2024-03-30 ENCOUNTER — Other Ambulatory Visit: Payer: Self-pay | Admitting: Internal Medicine

## 2024-03-30 ENCOUNTER — Other Ambulatory Visit: Payer: Self-pay

## 2024-03-30 ENCOUNTER — Ambulatory Visit: Attending: Nurse Practitioner

## 2024-03-30 DIAGNOSIS — M810 Age-related osteoporosis without current pathological fracture: Secondary | ICD-10-CM

## 2024-03-30 MED ORDER — JUBBONTI 60 MG/ML ~~LOC~~ SOSY
60.0000 mg | PREFILLED_SYRINGE | SUBCUTANEOUS | 1 refills | Status: AC
  Filled 2024-03-31: qty 1, 180d supply, fill #0

## 2024-03-30 MED ORDER — JUBBONTI 60 MG/ML ~~LOC~~ SOSY: 60.0000 mg | PREFILLED_SYRINGE | SUBCUTANEOUS | 1 refills | Status: DC

## 2024-03-30 NOTE — Progress Notes (Signed)
 [facility]  Referring Provider: Annabella Shutter, NP  Virtual Visit via Telephone Note  I connected with Emily Thompson on 03/30/24 at 11:00 AM EST by telephone and verified that I am speaking with the correct person using two identifiers.  Location: Patient: home Provider: office   I discussed the limitations, risks, security and privacy concerns of performing an evaluation and management service by telephone and the availability of in person appointments. I also discussed with the patient that there may be a patient responsible charge related to this service. The patient expressed understanding and agreed to proceed.   HPI: Emily Thompson is a 78 y.o. female who presents to the pharmacotherapy clinic via telephone for continuation of treatment of osteoporosis. Patient was previously using Prolia , will now switch to Jubbonti per insurance requriement.  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   Family history of malignant neoplasm of gastrointestinal tract 07/12/2020   Flatulence, eructation and gas pain 07/12/2020   SVT (supraventricular tachycardia)    PONV (postoperative nausea and vomiting)    Complication of anesthesia    BCC (basal cell carcinoma of skin)    Near syncope 10/26/2019   PCP NOTES >>>>>>>>>>>>>>>>>>>>>>>>>>>>>>>>>>>>> 07/03/2015   Endometrial cancer (HCC) 01/27/2015   Thickened endometrium 01/05/2015   Postmenopausal bleeding 01/03/2015   DJD (degenerative joint disease) 05/27/2013   Cough 05/27/2013   Palpitations 05/20/2013   Melanoma (HCC)    Allergic rhinitis 08/13/2011   Annual physical exam 05/04/2011   History of breast cancer, left.  March 2003. 12/29/2010   Breast cancer (HCC) 12/25/2010   Menopause 12/25/2010   Post-menopausal  atrophic vaginitis 12/25/2010   Hyperlipidemia 03/14/2010   HTN (hypertension) 03/14/2010   GERD 03/14/2010   Osteopenia 03/14/2010    Patient's Medications  New Prescriptions   No medications on file  Previous Medications   CALCIUM -VITAMIN D -VITAMIN K PO    Take 1,200 mg by mouth daily.   CHOLECALCIFEROL (VITAMIN D ) 1000 UNITS TABLET    Take 1,000 Units by mouth daily.   DENOSUMAB  (PROLIA ) 60 MG/ML SOSY INJECTION    Inject 60 mg into the skin every 6 (six) months.   DENOSUMAB  (PROLIA ) 60 MG/ML SOSY INJECTION    Inject 60 mg into the skin once for 1 dose.   DILTIAZEM  (TIADYLT  ER) 240 MG 24 HR CAPSULE    Take 1 capsule (240 mg total) by mouth daily.   ESTRADIOL  (ESTRACE  VAGINAL) 0.1 MG/GM VAGINAL CREAM    Place 1 g vaginally 2 (two) times a week. Apply externally only   EZETIMIBE  (ZETIA ) 10 MG TABLET    Take 1 tablet (10 mg total) by mouth daily.   FLUTICASONE -SALMETEROL (WIXELA INHUB) 100-50 MCG/ACT AEPB    Inhale 1 puff into the lungs 2 (two) times daily.   HYDROCHLOROTHIAZIDE  (HYDRODIURIL ) 12.5 MG TABLET    Take 1 tablet (12.5 mg total) by mouth daily.   HYDROCODONE  BIT-HOMATROPINE (HYCODAN) 5-1.5 MG/5ML SYRUP    Take 5 mLs by mouth at bedtime as needed for cough.   HYDROCORTISONE 2.5 % CREAM    Apply topically.   LANSOPRAZOLE (PREVACID) 15 MG CAPSULE    Take 15 mg by mouth daily at 12 noon.   METHOCARBAMOL  (ROBAXIN ) 500 MG TABLET    Take 1-2 tablets (500-1,000 mg total) by mouth every 6 (six) hours as needed.  METOPROLOL  SUCCINATE (TOPROL -XL) 25 MG 24 HR TABLET    Take 0.5 tablets (12.5 mg total) by mouth daily. Take with or immediately following a meal   MULTIPLE VITAMINS-MINERALS (PRESERVISION AREDS 2 PO)    Take 2 capsules by mouth daily. Unknown strenght   NYSTATIN  CREAM (MYCOSTATIN )    Apply 1 Application topically 2 (two) times daily.   PROBIOTIC PRODUCT (PROBIOTIC-10 PO)    Take by mouth.   ROSUVASTATIN  (CRESTOR ) 5 MG TABLET    Take 1 tablet (5 mg total) by mouth 4 (four) times  a week.   TELMISARTAN  (MICARDIS ) 40 MG TABLET    Take 1 tablet (40 mg total) by mouth daily.   TRIAMCINOLONE CREAM (KENALOG) 0.1 %    Apply 1 Application topically 2 (two) times daily.  Modified Medications   Modified Medication Previous Medication   DENOSUMAB -BBDZ (JUBBONTI) 60 MG/ML SOSY INJECTION denosumab -bbdz (JUBBONTI) 60 MG/ML SOSY injection      Inject 60 mg into the skin every 6 (six) months.    Inject 60 mg into the skin every 6 (six) months.  Discontinued Medications   No medications on file    Allergies: Allergies  Allergen Reactions   Dextromethorphan     Per pt, increases BP   Statins     Headache, hot flashes   Hydrocodone  Nausea Only    Tolerant to low doses at night   Morphine And Codeine     Could not move SEVERE NAUSEA   Tape Rash    Past Medical History: Past Medical History:  Diagnosis Date   Allergic rhinitis 08/13/2011   Annual physical exam 05/04/2011   BCC (basal cell carcinoma of skin) 2013, 2016   Breast cancer (HCC) 2003   chemo, radiation, lumpectomy, reconstructive surgery    Colon cancer screening 07/12/2020   Complication of anesthesia    Cough 05/27/2013   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     GERD (gastroesophageal reflux disease)    History of breast cancer, left.  March 2003. 12/29/2010   HTN (hypertension) 03/14/2010   Qualifier: Diagnosis of  By: Amon MD, Aloysius BRAVO.    Hyperlipidemia 03/14/2010   Qualifier: Diagnosis of  By: Amon MD, Aloysius BRAVO.    Hypertension    Melanoma (HCC) 2012   righ side face; basal cell (nose)    Menopause 12/25/2010   Near syncope 10/26/2019   Osteopenia    OSTEOPENIA 03/14/2010   Qualifier: Diagnosis of  By: Amon MD, Jose  E.    Palpitations 05/20/2013   PCP NOTES >>>>>>>>>>>>>>>>>>>>>>>>>>>>>>>>>>>>> 07/03/2015   Personal history of colonic polyps 07/12/2020   PONV (postoperative nausea and vomiting)    Post-menopausal atrophic vaginitis 12/25/2010   Postmenopausal bleeding 01/03/2015   Scoliosis    Sleep apnea    SVT (supraventricular tachycardia)    used to see Dr Morgan    Thickened endometrium 01/05/2015    Social History: Social History   Socioeconomic History   Marital status: Widowed    Spouse name: Not on file   Number of children: 2   Years of education: BSN   Highest education level: Bachelor's degree (e.g., BA, AB, BS)  Occupational History   Occupation: engineer, civil (consulting), 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)    Employer: RETIRED  Tobacco Use   Smoking status: Former    Current packs/day: 0.00    Types: Cigarettes    Start date: 05/21/1963    Quit date: 05/20/1973    Years since quitting: 50.8   Smokeless tobacco: Never  Vaping Use   Vaping status: Never Used  Substance and Sexual Activity   Alcohol use: Yes    Comment: occ   Drug use: No   Sexual activity: Not Currently    Birth control/protection: Post-menopausal    Comment: First IC >27 y/o, Partners <5  Other Topics Concern   Not on file  Social History Narrative   Widow, 1 adopted and 1 natural child; ([place], [place])   Live by herself   Lost her long-term companion Norleen Lidji  09/11/2021   Social Drivers of Health   Financial Resource Strain: Low Risk  (11/11/2023)   Overall Financial Resource Strain (CARDIA)    Difficulty of Paying Living Expenses: Not hard at all  Food Insecurity: No Food Insecurity (11/11/2023)   Hunger Vital Sign    Worried About Running Out of Food in the Last Year: Never true    Ran Out of Food in the Last Year: Never true  Transportation Needs: No Transportation Needs (11/11/2023)   PRAPARE - Administrator, Civil Service (Medical): No    Lack of Transportation (Non-Medical): No  Physical  Activity: Sufficiently Active (11/11/2023)   Exercise Vital Sign    Days of Exercise per Week: 6 days    Minutes of Exercise per Session: 40 min  Stress: Stress Concern Present (11/11/2023)   [employer] of Occupational Health - Occupational Stress Questionnaire    Feeling of Stress: To some extent  Social Connections: Unknown (11/11/2023)   Social Connection and Isolation Panel    Frequency of Communication with Friends and Family: Patient declined    Frequency of Social Gatherings with Friends and Family: Patient declined    Attends Religious Services: Never    Database Administrator or Organizations: Yes    Attends Engineer, Structural: More than 4 times per year    Marital Status: Widowed    Medication: Jubbonti (denosumab -bbdz)  Assessment: Patient previously maintained on Prolia  for osteoporosis with no adverse effects. She will be switched to Jubbonti per insurance requirement.  Vitamin D  Lab Results  Component Value Date   VD25OH 50 05/27/2013    Calcium  Lab Results  Component Value Date   CALCIUM  9.6 12/17/2023    DEXA scan, most current DEXA scan T-score is minus 2.4 (07/08/23).  Plan:  -Counseled patient on purpose, proper use, and adverse effects of Jubbonti (denosumab -bbdz).  Counseled patient that medication must be injected every 6 months by a healthcare professional.  Advised patient to take calcium  1200 mg daily and vitamin D  800 units daily.  Reviewed the most common adverse effects including risk of infection, osteonecrosis of the jaw, rash, and muscle/bone pain.  Patient confirms she does not have any major dental work planned at this time.  Reviewed with patient the signs/symptoms of low calcium  and advised patient to alert us  if she experiences these symptoms.  - Patient will be scheduled for continuation of therapy Jubbonti (denosumab -bbdz) appointment at [facility] when medication arrives to office.  - Rx will be triaged  to [facility] for courier to [facility].   I discussed the assessment and treatment plan with the patient.  The patient was provided an opportunity to ask questions and all were answered. The patient agreed with the plan and demonstrated an understanding of the instructions.   The patient was advised to call back or seek an in-person evaluation if the symptoms worsen or if the condition fails to improve as anticipated.  I provided 15 minutes of non-face-to-face time during this encounter.  Delon Brow, PharmD, CSP, AAHIVP, CPP Clinical Pharmacist Practitioner - Medication Therapy Disease Management/Specialty Pharmacy Services 03/30/2024, 11:16 AM

## 2024-03-30 NOTE — Telephone Encounter (Signed)
 Jubbonti #1, 1RF sent to [facility].

## 2024-03-31 ENCOUNTER — Other Ambulatory Visit: Payer: Self-pay

## 2024-03-31 NOTE — Progress Notes (Signed)
 Specialty Pharmacy Initial Fill Coordination Note  Emily Thompson is a 78 y.o. female contacted today regarding initial fill of specialty medication(s) Denosumab -bbdz (Jubbonti)   Patient requested Courier to Provider Office   Delivery date: 04/02/24   Verified address: [facility] [place]-  [address] [phone]   Medication will be filled on: 04/01/24   Patient is aware of $250 copayment.

## 2024-04-01 ENCOUNTER — Other Ambulatory Visit: Payer: Self-pay

## 2024-04-02 ENCOUNTER — Other Ambulatory Visit: Payer: Self-pay

## 2024-04-30 ENCOUNTER — Other Ambulatory Visit: Payer: Self-pay | Admitting: *Deleted

## 2024-04-30 ENCOUNTER — Encounter: Payer: Self-pay | Admitting: Nurse Practitioner

## 2024-04-30 ENCOUNTER — Ambulatory Visit: Admitting: Nurse Practitioner

## 2024-04-30 VITALS — BP 130/80 | HR 68 | Ht 58.5 in | Wt 149.0 lb

## 2024-04-30 DIAGNOSIS — M810 Age-related osteoporosis without current pathological fracture: Secondary | ICD-10-CM

## 2024-04-30 DIAGNOSIS — N958 Other specified menopausal and perimenopausal disorders: Secondary | ICD-10-CM | POA: Diagnosis not present

## 2024-04-30 MED ORDER — DENOSUMAB-BBDZ 60 MG/ML ~~LOC~~ SOSY
60.0000 mg | PREFILLED_SYRINGE | Freq: Once | SUBCUTANEOUS | Status: AC
  Administered 2024-04-30: 14:00:00 60 mg via SUBCUTANEOUS

## 2024-04-30 NOTE — Progress Notes (Signed)
° °  SIVAN CUELLO 12-Aug-1945 [id number]   History:  78 y.o. G1P1001 presents for medication management. Postmenopausal. 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 Using vaginal estrogen twice weekly for recurrent UTIs/GSM. Good management. Switched from Boniva  (started 04/2017) to Prolia  (now Jubbonti ) due to significant decrease in bone mass.   Past medical history, past surgical history, family history and social history were all reviewed and documented in the EPIC chart.  ROS:  A ROS was performed and pertinent positives and negatives are included.  Exam:  Vitals:   04/30/24 1130  BP: 130/80  Pulse: 68  SpO2: 99%  Weight: 149 lb (67.6 kg)  Height: 4' 10.5 (1.486 m)   Body mass index is 30.61 kg/m.  General appearance: Normal  Assessment/Plan:  78 y.o. G1P1001 for medication management.   Genitourinary syndrome of menopause - using vaginal estrogen twice weekly with good management. Does not need refill at this time.   Age-related osteoporosis without current pathological fracture - Switched from Boniva  (started 04/2017) to Prolia  (now Jubbonti ) due to significant decrease in bone mass.   Return in about 1 year (around 04/30/2025) for B&P.    Annabella DELENA Shutter DNP, 11:50 AM 04/30/2024

## 2024-04-30 NOTE — Progress Notes (Signed)
 Med check Last AEX: 04/2023

## 2024-05-20 ENCOUNTER — Other Ambulatory Visit: Payer: Self-pay | Admitting: Internal Medicine

## 2024-05-31 ENCOUNTER — Ambulatory Visit: Payer: Self-pay

## 2024-06-01 ENCOUNTER — Encounter: Payer: Self-pay | Admitting: Nurse Practitioner

## 2024-06-01 ENCOUNTER — Ambulatory Visit: Admitting: Family Medicine

## 2024-06-01 VITALS — BP 126/78 | HR 72 | Temp 97.8°F | Resp 16 | Ht 58.5 in | Wt 151.0 lb

## 2024-06-01 DIAGNOSIS — N390 Urinary tract infection, site not specified: Secondary | ICD-10-CM | POA: Diagnosis not present

## 2024-06-01 DIAGNOSIS — R300 Dysuria: Secondary | ICD-10-CM | POA: Diagnosis not present

## 2024-06-01 DIAGNOSIS — R8290 Unspecified abnormal findings in urine: Secondary | ICD-10-CM

## 2024-06-01 LAB — POC URINALSYSI DIPSTICK (AUTOMATED)
Blood, UA: NEGATIVE
Glucose, UA: POSITIVE — AB
Nitrite, UA: POSITIVE
Protein, UA: NEGATIVE
Spec Grav, UA: <=1.005 — AB
Urobilinogen, UA: 0.2 U/dL
pH, UA: 6

## 2024-06-01 MED ORDER — CEPHALEXIN 500 MG PO CAPS: 500.0000 mg | ORAL_CAPSULE | Freq: Two times a day (BID) | ORAL | 0 refills | Status: DC

## 2024-06-01 NOTE — Assessment & Plan Note (Signed)
 Culture pending Keflex  x 7 days

## 2024-06-01 NOTE — Progress Notes (Signed)
 "  Subjective:    Patient ID: Emily Thompson, female    DOB: Mar 20, 1946, 79 y.o.   MRN: [id number]  Chief Complaint  Patient presents with   Dysuria    Sxs started yesterday, pt states having pressure, burning, and chills    HPI Patient is in today for dysuria.   Discussed the use of AI scribe software for clinical note transcription with the patient, who gave verbal consent to proceed.  History of Present Illness Emily Thompson Emily Thompson is an 79 year old female who presents with recurrent urinary symptoms.  She experiences burning and pressure during urination, accompanied by shivering. These symptoms initially improved with increased water  intake but returned with full force the previous night.  She has a history of similar episodes, previously managed with a steroid cream prescribed by her gynecologist. However, this episode feels different, with less burning at the urethral meatus and more pressure at the bladder.  She recently started on Jumanty, having switched from Prolia  due to cost, and mentions that urinary symptoms are a known side effect of this medication.  She took Farydak last night and has been drinking a lot of water  to manage her symptoms. She also took Prodium at 11 PM last night.  No current pain and no allergies to antibiotics.   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     GERD (gastroesophageal reflux disease)    History of breast cancer, left.  March 2003. 12/29/2010   HTN (hypertension) 03/14/2010   Qualifier: Diagnosis of  By: Amon MD, Aloysius BRAVO.    Hyperlipidemia 03/14/2010   Qualifier: Diagnosis of  By: Amon MD, Aloysius BRAVO.    Hypertension    Melanoma (HCC) 2012   righ side face; basal cell (nose)    Menopause 12/25/2010   Near syncope 10/26/2019   Osteopenia    OSTEOPENIA 03/14/2010   Qualifier: Diagnosis of  By: Amon MD, Jose E.    Palpitations 05/20/2013   PCP NOTES >>>>>>>>>>>>>>>>>>>>>>>>>>>>>>>>>>>>> 07/03/2015   Personal history of colonic polyps 07/12/2020   PONV (postoperative nausea and vomiting)    Post-menopausal atrophic vaginitis 12/25/2010   Postmenopausal bleeding 01/03/2015   Scoliosis    Sleep apnea    SVT (supraventricular tachycardia)    used to see Dr Morgan    Thickened endometrium 01/05/2015    Past Surgical History:  Procedure Laterality Date   ABDOMINAL HYSTERECTOMY  2016   BASAL CELL CARCINOMA EXCISION     BREAST LUMPECTOMY Left 07/2001   BREAST RECONSTRUCTION  2005   CESAREAN SECTION  1977   COLONOSCOPY W/ POLYPECTOMY  2013   NEXT DUE IN 5 YRS   NM MYOCAR PERF WALL MOTION  01/2006   bruce myoview; no evidence of inducible ischemia, anterior wall thinning without evidence of ischemia; post-stress EF 88%; low risk scan    NOSE SURGERY  basal cell carcinoma removed   R shoulder Arthroscopy and spur removal  02/15/20222   ROBOTIC ASSISTED TOTAL HYSTERECTOMY WITH BILATERAL SALPINGO OOPHERECTOMY Bilateral 01/27/2015   Procedure: ROBOTIC ASSISTED TOTAL HYSTERECTOMY WITH BILATERAL SALPINGO OOPHORECTOMY AND SENTINAL NODE BIOPSY;  Surgeon: Maurilio Ship, MD;  Location: [facility];  Service: Gynecology;  Laterality: Bilateral;   SHOULDER SURGERY Right    SKIN CANCER EXCISION     melanoma   TONSILLECTOMY AND ADENOIDECTOMY  1952   TRANSTHORACIC ECHOCARDIOGRAM  12/2005   EF 50-55%; mild MR; trace TR   TUBAL  LIGATION  1978   VULVA /PERINEUM BIOPSY     PAPILLOMA    Family History  Problem Relation Age of Onset   Hypertension Mother    Thyroid  disease Mother        M and B (?)   Hypertension Father    Stroke Father        hemorrhagic CVA at age 77   Bipolar disorder Brother    Diabetes Brother    Colon cancer Maternal Aunt        dx at age 60s   Breast cancer Neg Hx     Social History   Socioeconomic History   Marital status: Widowed    Spouse name: Not on file   Number of children: 2   Years of education: BSN   Highest education level: Bachelor's degree (e.g., BA, AB, BS)  Occupational History   Occupation: engineer, civil (consulting), 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)    Employer: RETIRED  Tobacco Use   Smoking status: Former    Current packs/day: 0.00    Types: Cigarettes    Start date: 05/21/1963    Quit date: 05/20/1973    Years since quitting: 51.0   Smokeless tobacco: Never  Vaping Use   Vaping status: Never Used  Substance and Sexual Activity   Alcohol use: Yes    Comment: occ   Drug use: No   Sexual activity: Not Currently    Birth control/protection: Post-menopausal    Comment: First IC >46 y/o, Partners <5  Other Topics Concern   Not on file  Social History Narrative   Widow, 1 adopted and 1 natural child; ([place], [place])   Live by herself   Lost her long-term companion Norleen Lidji  09/11/2021   Social Drivers of Health   Tobacco Use: Medium Risk (04/30/2024)   Patient History    Smoking Tobacco Use: Former    Smokeless Tobacco Use: Never    Passive Exposure: Not on Actuary Strain: Low Risk (11/11/2023)   Overall Financial Resource Strain (CARDIA)    Difficulty of Paying Living Expenses: Not hard at all  Food Insecurity: No Food Insecurity (11/11/2023)   Epic    Worried About Radiation Protection Practitioner of Food in the Last Year: Never true    Ran Out of Food in the Last Year: Never true  Transportation Needs: No Transportation Needs (11/11/2023)   Epic    Lack of  Transportation (Medical): No    Lack of Transportation (Non-Medical): No  Physical Activity: Sufficiently Active (11/11/2023)   Exercise Vital Sign    Days of Exercise per Week: 6 days    Minutes of Exercise per Session: 40 min  Stress: Stress Concern Present (11/11/2023)   [employer] of Occupational Health - Occupational Stress Questionnaire    Feeling of Stress: To some extent  Social Connections: Unknown (11/11/2023)   Social Connection and Isolation Panel    Frequency of Communication  with Friends and Family: Patient declined    Frequency of Social Gatherings with Friends and Family: Patient declined    Attends Religious Services: Never    Database Administrator or Organizations: Yes    Attends Engineer, Structural: More than 4 times per year    Marital Status: Widowed  Intimate Partner Violence: Not on file  Depression (PHQ2-9): Low Risk (12/17/2023)   Depression (PHQ2-9)    PHQ-2 Score: 4  Alcohol Screen: Low Risk (11/11/2023)   Alcohol Screen    Last Alcohol Screening Score (AUDIT): 1  Housing: Unknown (11/11/2023)   Epic    Unable to Pay for Housing in the Last Year: No    Number of Times Moved in the Last Year: Not on file    Homeless in the Last Year: No  Utilities: Not At Risk (07/31/2022)   [facility] Utilities    Threatened with loss of utilities: No  Health Literacy: Not on file    Outpatient Medications Prior to Visit  Medication Sig Dispense Refill   CALCIUM -VITAMIN D -VITAMIN K PO Take 1,200 mg by mouth daily.     cholecalciferol (VITAMIN D ) 1000 units tablet Take 1,000 Units by mouth daily.     denosumab -bbdz (JUBBONTI ) 60 MG/ML SOSY injection Inject 60 mg into the skin every 6 (six) months. 1 mL 1   diltiazem  (TIADYLT  ER) 240 MG 24 hr capsule Take 1 capsule (240 mg total) by mouth daily. 90 capsule 1   ezetimibe  (ZETIA ) 10 MG tablet Take 1 tablet (10 mg total) by mouth daily. 90 tablet 1   hydrochlorothiazide  (HYDRODIURIL ) 12.5 MG tablet Take 1 tablet  (12.5 mg total) by mouth daily. 90 tablet 1   HYDROcodone  bit-homatropine (HYCODAN) 5-1.5 MG/5ML syrup Take 5 mLs by mouth at bedtime as needed for cough. 120 mL 0   hydrocortisone 2.5 % cream Apply topically.     lansoprazole (PREVACID) 15 MG capsule Take 15 mg by mouth daily at 12 noon.     metoprolol  succinate (TOPROL -XL) 25 MG 24 hr tablet Take 0.5 tablets (12.5 mg total) by mouth daily. Take with or immediately following a meal 45 tablet 1   Multiple Vitamins-Minerals (PRESERVISION AREDS 2 PO) Take 2 capsules by mouth daily. Unknown strenght     nystatin  cream (MYCOSTATIN ) Apply 1 Application topically 2 (two) times daily. 30 g 2   Probiotic Product (PROBIOTIC-10 PO) Take by mouth.     rosuvastatin  (CRESTOR ) 5 MG tablet Take 1 tablet (5 mg total) by mouth 4 (four) times a week. 48 tablet 1   telmisartan  (MICARDIS ) 40 MG tablet Take 1 tablet (40 mg total) by mouth daily. 90 tablet 1   fluticasone -salmeterol (WIXELA INHUB) 100-50 MCG/ACT AEPB Inhale 1 puff into the lungs 2 (two) times daily. 1 each 2   denosumab  (PROLIA ) 60 MG/ML SOSY injection Inject 60 mg into the skin every 6 (six) months. (Patient not taking: Reported on 06/01/2024)     methocarbamol  (ROBAXIN ) 500 MG tablet Take 1-2 tablets (500-1,000 mg total) by mouth every 6 (six) hours as needed. (Patient not taking: Reported on 06/01/2024) 60 tablet 1   triamcinolone cream (KENALOG) 0.1 % Apply 1 Application topically 2 (two) times daily. (Patient not taking: Reported on 06/01/2024)     Facility-Administered Medications Prior to Visit  Medication Dose Route Frequency Provider Last Rate Last Admin   denosumab  (PROLIA ) injection 60 mg  60 mg Subcutaneous Q6 months Prentiss Riggs A, NP        Allergies  Allergen Reactions   Dextromethorphan     Per pt, increases BP   Statins     Headache, hot flashes   Hydrocodone  Nausea Only    Tolerant to low doses at night   Morphine And Codeine     Could not move SEVERE NAUSEA   Tape Rash     Review of Systems  Constitutional:  Negative for fever and malaise/fatigue.  HENT:  Negative for congestion.   Eyes:  Negative for blurred vision.  Respiratory:  Negative for shortness of breath.   Cardiovascular:  Negative for chest pain, palpitations and leg swelling.  Gastrointestinal:  Negative for abdominal pain, blood in stool and nausea.  Genitourinary:  Positive for dysuria and frequency.  Musculoskeletal:  Negative for falls.  Skin:  Negative for rash.  Neurological:  Negative for dizziness, loss of consciousness and headaches.  Endo/Heme/Allergies:  Negative for environmental allergies.  Psychiatric/Behavioral:  Negative for depression. The patient is not nervous/anxious.        Objective:    Physical Exam Vitals and nursing note reviewed.  Constitutional:      General: She is not in acute distress.    Appearance: Normal appearance. She is well-developed.  HENT:     Head: Normocephalic and atraumatic.  Eyes:     General: No scleral icterus.       Right eye: No discharge.        Left eye: No discharge.  Cardiovascular:     Rate and Rhythm: Normal rate and regular rhythm.     Heart sounds: No murmur heard. Pulmonary:     Effort: Pulmonary effort is normal. No respiratory distress.     Breath sounds: Normal breath sounds.  Abdominal:     Palpations: Abdomen is soft.     Tenderness: There is no abdominal tenderness. There is no guarding or rebound.  Musculoskeletal:        General: Normal range of motion.     Cervical back: Normal range of motion and neck supple.     Right lower leg: No edema.     Left lower leg: No edema.  Skin:    General: Skin is warm and dry.  Neurological:     Mental Status: She is alert and oriented to person, place, and time.  Psychiatric:        Mood and Affect: Mood normal.        Behavior: Behavior normal.        Thought Content: Thought content normal.        Judgment: Judgment normal.     BP 126/78 (BP Location: Left Arm,  Patient Position: Sitting, Cuff Size: Normal)   Pulse 72   Temp 97.8 F (36.6 C) (Oral)   Resp 16   Ht 4' 10.5 (1.486 m)   Wt 151 lb (68.5 kg)   LMP 05/14/1997   SpO2 98%   BMI 31.02 kg/m  Wt Readings from Last 3 Encounters:  06/01/24 151 lb (68.5 kg)  04/30/24 149 lb (67.6 kg)  12/17/23 149 lb 4 oz (67.7 kg)    Diabetic Foot Exam - Simple   No data filed    Lab Results  Component Value Date   WBC 6.9 12/17/2023   HGB 13.0 12/17/2023   HCT 39.9 12/17/2023   PLT 246.0 12/17/2023   GLUCOSE 86 12/17/2023   CHOL 174 06/19/2023   TRIG 74.0 06/19/2023   HDL 74.30 06/19/2023   LDLDIRECT 114.7 07/28/2012   LDLCALC 85 06/19/2023   ALT  13 06/19/2023   AST 23 06/19/2023   NA 141 12/17/2023   K 4.3 12/17/2023   CL 102 12/17/2023   CREATININE 0.77 12/17/2023   BUN 16 12/17/2023   CO2 33 (H) 12/17/2023   TSH 1.76 12/13/2021    Lab Results  Component Value Date   TSH 1.76 12/13/2021   Lab Results  Component Value Date   WBC 6.9 12/17/2023   HGB 13.0 12/17/2023   HCT 39.9 12/17/2023   MCV 91.2 12/17/2023   PLT 246.0 12/17/2023   Lab Results  Component Value Date   NA 141 12/17/2023   K 4.3 12/17/2023   CO2 33 (H) 12/17/2023   GLUCOSE 86 12/17/2023   BUN 16 12/17/2023   CREATININE 0.77 12/17/2023   BILITOT 0.6 06/19/2023   ALKPHOS 92 06/19/2023   AST 23 06/19/2023   ALT 13 06/19/2023   PROT 6.5 06/19/2023   ALBUMIN 4.2 06/19/2023   CALCIUM  9.6 12/17/2023   ANIONGAP 10 08/01/2019   GFR 73.06 12/17/2023   Lab Results  Component Value Date   CHOL 174 06/19/2023   Lab Results  Component Value Date   HDL 74.30 06/19/2023   Lab Results  Component Value Date   LDLCALC 85 06/19/2023   Lab Results  Component Value Date   TRIG 74.0 06/19/2023   Lab Results  Component Value Date   CHOLHDL 2 06/19/2023   No results found for: HGBA1C     Assessment & Plan:  Urinary tract infection without hematuria, site unspecified Assessment & Plan: Culture  pending Keflex  x 7 days   Orders: -     Cephalexin ; Take 1 capsule (500 mg total) by mouth 2 (two) times daily.  Dispense: 14 capsule; Refill: 0  Dysuria -     POCT Urinalysis Dipstick (Automated)  Abnormal urine findings -     Urine Culture   Assessment and Plan Assessment & Plan Urinary tract infection   She experiences recurrent urinary tract infections with burning, pressure, and shivering during urination. Symptoms improved with increased water  intake but recurred overnight. Current symptoms include more bladder pressure and less urethral burning compared to previous episodes. The recent initiation of Jumanty may contribute to these symptoms due to its known urinary side effects. No current pain is reported, and symptoms are managed with increased hydration. A urine sample was sent for culture to confirm infection and guide antibiotic therapy. She is advised to continue increased water  intake to help dilute urine and alleviate symptoms. She should contact her gynecologist to discuss current symptoms and the potential impact of Jumanty on urinary symptoms.   Emily Thompson R Lowne Chase, DO  "

## 2024-06-09 ENCOUNTER — Encounter: Payer: Self-pay | Admitting: *Deleted

## 2024-06-09 DIAGNOSIS — G4733 Obstructive sleep apnea (adult) (pediatric): Secondary | ICD-10-CM

## 2024-06-09 NOTE — Telephone Encounter (Signed)
 Pt asked if cpap supplies can be called in for her till she is seen in the office. Pt has been scheduled.

## 2024-06-11 ENCOUNTER — Ambulatory Visit: Payer: Self-pay

## 2024-06-11 NOTE — Telephone Encounter (Signed)
 Please inform Pt she needs a visit to come in if sx's have returned.
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
## 2024-06-11 NOTE — Telephone Encounter (Signed)
 Pt is scheduled

## 2024-06-12 ENCOUNTER — Encounter: Payer: Self-pay | Admitting: Internal Medicine

## 2024-06-12 ENCOUNTER — Ambulatory Visit: Admitting: Internal Medicine

## 2024-06-12 VITALS — BP 138/86 | HR 70 | Temp 97.4°F | Resp 16 | Ht 58.5 in | Wt 153.0 lb

## 2024-06-12 DIAGNOSIS — N390 Urinary tract infection, site not specified: Secondary | ICD-10-CM | POA: Diagnosis not present

## 2024-06-12 LAB — URINALYSIS, ROUTINE W REFLEX MICROSCOPIC
Bilirubin Urine: NEGATIVE
Ketones, ur: NEGATIVE
Nitrite: POSITIVE — AB
Specific Gravity, Urine: <=1.005 — AB (ref 1.000–1.030)
Urine Glucose: 100 — AB
Urobilinogen, UA: 1 (ref 0.0–1.0)
pH: 7 (ref 5.0–8.0)

## 2024-06-12 MED ORDER — SULFAMETHOXAZOLE-TRIMETHOPRIM 800-160 MG PO TABS: 1.0000 | ORAL_TABLET | Freq: Two times a day (BID) | ORAL | 0 refills | Status: DC

## 2024-06-12 NOTE — Patient Instructions (Signed)
 Start Bactrim , the antibiotic, for 5 days  Drink plenty of fluids  Okay to take Pyridium over-the-counter  Consider cranberry juice  Call if you get worse  See you next week for your routine visit

## 2024-06-14 LAB — URINE CULTURE
MICRO NUMBER:: 17532180
SPECIMEN QUALITY:: ADEQUATE

## 2024-06-17 ENCOUNTER — Ambulatory Visit: Payer: Self-pay | Admitting: Internal Medicine

## 2024-06-18 NOTE — Progress Notes (Unsigned)
 Setup date 04/25/2022

## 2024-06-19 ENCOUNTER — Encounter: Payer: Self-pay | Admitting: Internal Medicine

## 2024-06-19 ENCOUNTER — Encounter: Admitting: Internal Medicine

## 2024-06-19 VITALS — BP 126/76 | HR 79 | Temp 98.1°F | Resp 16 | Ht <= 58 in | Wt 148.4 lb

## 2024-06-19 DIAGNOSIS — Z0000 Encounter for general adult medical examination without abnormal findings: Secondary | ICD-10-CM

## 2024-06-19 DIAGNOSIS — I10 Essential (primary) hypertension: Secondary | ICD-10-CM

## 2024-06-19 DIAGNOSIS — Z23 Encounter for immunization: Secondary | ICD-10-CM

## 2024-06-19 DIAGNOSIS — E782 Mixed hyperlipidemia: Secondary | ICD-10-CM

## 2024-06-19 LAB — LIPID PANEL
Cholesterol: 185 mg/dL (ref 28–200)
HDL: 72.4 mg/dL
LDL Cholesterol: 82 mg/dL (ref 10–99)
NonHDL: 112.99
Total CHOL/HDL Ratio: 3
Triglycerides: 157 mg/dL — ABNORMAL HIGH (ref 10.0–149.0)
VLDL: 31.4 mg/dL (ref 0.0–40.0)

## 2024-06-19 LAB — ALT: ALT: 16 U/L (ref 3–35)

## 2024-06-19 LAB — BASIC METABOLIC PANEL WITH GFR
BUN: 24 mg/dL — ABNORMAL HIGH (ref 6–23)
CO2: 31 meq/L (ref 19–32)
Calcium: 9.8 mg/dL (ref 8.4–10.5)
Chloride: 102 meq/L (ref 96–112)
Creatinine, Ser: 0.84 mg/dL (ref 0.40–1.20)
GFR: 65.58 mL/min
Glucose, Bld: 89 mg/dL (ref 70–99)
Potassium: 4.6 meq/L (ref 3.5–5.1)
Sodium: 140 meq/L (ref 135–145)

## 2024-06-19 LAB — AST: AST: 20 U/L (ref 5–37)

## 2024-06-19 MED ORDER — HYDROCODONE BIT-HOMATROP MBR 5-1.5 MG/5ML PO SOLN: 5.0000 mL | Freq: Every evening | ORAL | 0 refills | Status: AC | PRN

## 2024-06-19 NOTE — Patient Instructions (Signed)
 Please read your instructions carefully.   GO TO THE LAB :  Get the blood work    Go to the front desk for the checkout Please make an appointment for a checkup in 6 months   You are a pneumonia shot today.  Check the  blood pressure regularly Blood pressure goal:  between 110/65 and  130/80. If it is consistently higher or lower, let me know      HYPERTENSION: Your blood pressure is well-controlled with your current medications. -Continue taking Micardis , metoprolol , diltiazem , and hydrochlorothiazide  as prescribed.  HYPERLIPIDEMIA: Your cholesterol levels are being managed with your current medications. -Continue taking rosuvastatin  and ezetimibe  as prescribed.     GASTROESOPHAGEAL REFLUX DISEASE: Your acid reflux is being managed with your current medication. -Continue taking lansoprazole as prescribed.  OBSTRUCTIVE SLEEP APNEA: Your sleep apnea is being managed with CPAP therapy. Dry throat is likely due to dry air and CPAP use. We are refilling your cough medication.

## 2024-06-19 NOTE — Progress Notes (Unsigned)
 "  Subjective:    Patient ID: Othel GORMAN George, female    DOB: 04/06/1946, 79 y.o.   MRN: [id number]  DOS:  06/19/2024 CPX   Discussed the use of AI scribe software for clinical note transcription with the patient, who gave verbal consent to proceed.  History of Present Illness [place] [facility] Jasdeep Dejarnett is an 79 year old female who presents for an annual physical exam.  She had a urinary tract infection last week, treated with antibiotics, with symptom resolution after an initial delay in improvement.  She has chronic back instability without new issues and notes mild soreness related to a new exercise regimen.  She exercises four to five days a week and follows a generally healthy diet with frequent fish intake. She has increased her water  intake and notes a recent 5-pound weight loss.  Her medications include Prolia , Micardis , metoprolol , diltiazem , hydrochlorothiazide , and medication for acid reflux. She occasionally uses a cough syrup for dry throat related to dry air and CPAP use.  She has had no recent chest pain, respiratory difficulties, nausea, vomiting, or blood in stools.   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     GERD (gastroesophageal reflux disease)    History of breast cancer, left.  March 2003. 12/29/2010   HTN (hypertension) 03/14/2010   Qualifier: Diagnosis of  By: Amon MD, Aloysius BRAVO.    Hyperlipidemia 03/14/2010   Qualifier: Diagnosis of  By: Amon MD, Aloysius BRAVO.    Hypertension    Melanoma (HCC) 2012   righ side face; basal cell (nose)    Menopause 12/25/2010   Near syncope 10/26/2019   Osteopenia    OSTEOPENIA 03/14/2010   Qualifier: Diagnosis of  By: Amon MD, Jahquez Steffler E.    Palpitations 05/20/2013   PCP NOTES >>>>>>>>>>>>>>>>>>>>>>>>>>>>>>>>>>>>> 07/03/2015   Personal history of colonic polyps 07/12/2020   PONV (postoperative nausea and vomiting)    Post-menopausal atrophic vaginitis 12/25/2010   Postmenopausal bleeding 01/03/2015   Scoliosis    Sleep apnea    SVT (supraventricular tachycardia)    used to see Dr Morgan    Thickened endometrium 01/05/2015    Past Surgical History:  Procedure Laterality Date   ABDOMINAL HYSTERECTOMY  2016   BASAL CELL CARCINOMA EXCISION     BREAST LUMPECTOMY Left 07/2001   BREAST RECONSTRUCTION  2005   CESAREAN SECTION  1977   COLONOSCOPY W/ POLYPECTOMY  2013   NEXT DUE IN 5 YRS   NM MYOCAR PERF WALL MOTION  01/2006   bruce myoview; no evidence of inducible ischemia, anterior wall thinning without evidence of ischemia; post-stress EF 88%; low risk scan  NOSE SURGERY     basal cell carcinoma removed   R shoulder Arthroscopy and spur removal  02/15/20222   ROBOTIC ASSISTED TOTAL HYSTERECTOMY WITH BILATERAL SALPINGO OOPHERECTOMY Bilateral 01/27/2015   Procedure: ROBOTIC ASSISTED TOTAL HYSTERECTOMY WITH BILATERAL SALPINGO OOPHORECTOMY AND SENTINAL NODE BIOPSY;  Surgeon: Maurilio Ship, MD;  Location: [facility];  Service: Gynecology;  Laterality: Bilateral;   SHOULDER SURGERY Right    SKIN CANCER EXCISION     melanoma   TONSILLECTOMY AND ADENOIDECTOMY  1952   TRANSTHORACIC ECHOCARDIOGRAM  12/2005   EF 50-55%; mild MR; trace TR   TUBAL LIGATION  1978    VULVA /PERINEUM BIOPSY     PAPILLOMA    Current Outpatient Medications  Medication Instructions   CALCIUM -VITAMIN D -VITAMIN K PO 1,200 mg, Daily   cephALEXin  (KEFLEX ) 500 mg, Oral, 2 times daily   cholecalciferol (VITAMIN D ) 1,000 Units, Daily   diltiazem  (TIADYLT  ER) 240 mg, Oral, Daily   ezetimibe  (ZETIA ) 10 mg, Oral, Daily   hydrochlorothiazide  (HYDRODIURIL ) 12.5 mg, Oral, Daily   HYDROcodone  bit-homatropine (HYCODAN) 5-1.5 MG/5ML syrup 5 mLs, Oral, At bedtime PRN   hydrocortisone 2.5 % cream Apply topically.   Jubbonti  60 mg, Subcutaneous, Every 6 months   lansoprazole (PREVACID) 15 mg, Daily   metoprolol  succinate (TOPROL -XL) 12.5 mg, Oral, Daily, Take with or immediately following a meal   Multiple Vitamins-Minerals (PRESERVISION AREDS 2 PO) 2 capsules, Daily   nystatin  cream (MYCOSTATIN ) 1 Application, Topical, 2 times daily   Probiotic Product (PROBIOTIC-10 PO) Take by mouth.   rosuvastatin  (CRESTOR ) 5 mg, Oral, 4 times weekly   sulfamethoxazole -trimethoprim  (BACTRIM  DS) 800-160 MG tablet 1 tablet, Oral, 2 times daily   telmisartan  (MICARDIS ) 40 mg, Oral, Daily       Objective:   Physical Exam BP 126/76   Pulse 79   Temp 98.1 F (36.7 C) (Oral)   Resp 16   Ht 4' 10 (1.473 m)   Wt 148 lb 6 oz (67.3 kg)   LMP 05/14/1997   SpO2 97%   BMI 31.01 kg/m  General: Well developed, NAD, BMI noted Neck: No  thyromegaly  HEENT:  Normocephalic . Face symmetric, atraumatic Lungs:  CTA B Normal respiratory effort, no intercostal retractions, no accessory muscle use. Heart: RRR,  no murmur.  Abdomen:  Not distended, soft, non-tender. No rebound or rigidity.   Lower extremities: no pretibial edema bilaterally  Skin: Exposed areas without rash. Not pale. Not jaundice Neurologic:  alert & oriented X3.  Speech normal, gait appropriate for age and unassisted Strength symmetric and appropriate for age.  Psych: Cognition and judgment appear intact.  Cooperative with  normal attention span and concentration.  Behavior appropriate. No anxious or depressed appearing.     Assessment   Problem list HTN Hyperlipidemia (rosuvastatin  4 times a week, intolerant to higher doses) GERD Osteopenia: Per gynecology.  dexa 2016 (per gyn); rx boniva  05-2017.  On Prolia  as of 2026 per gynecology Insomnia  MSK: ---DJD,  NSAIDs prn --Back pain, --2017-2018: PT, injections Dr Freddie Cough-- hycodan prn (tolerates well small doses hydrocodone ) Oncology: --Breast cancer 2003 --Endometrial cancer, H-BSO 01/2015.  Follow-up by gynecology --Melanoma, BCC (Dr Robinson)  [facility] OSA: Severe, Dx 2023. On Cpap Lung nodule, 3mm, per CTs, last CT 09/30/2015 : stable  H/o SVT     Here for CPX - Td 2024 - PNM 23: 2010, boster 01/2019;  prevnar: 2016; PNM 20 today  - S/p zostavax;  s/p shingrix.  S/p RSV per patient - Had a  flu and  COVID-vaccine  recently - PNM 20-[facility]:    Dr Kristie , 2nd cscope  09-2011;   cscope 2018,  pt reports  she spoke w/ GI office, was rec no further CCS (aged out)  -Female care per gyn; last [facility] November 2025 ([facility])  -Lifestyle: Exercises 4-5 times a week.  Eats healthy -Labs:BMP AST ALT FLP -Healthcare POA: On file  Assessment and Plan Assessment & Plan HTN Controlled.  Continue Micardis , metoprolol , diltiazem , and hydrochlorothiazide .  Check BMP High cholesterol Managed with rosuvastatin  and ezetimibe .  Check FLP AST ALT Osteoporosis: Per gynecology, on Prolia  GERD Managed with lansoprazole.  No change OSA Managed with CPAP.SABRA Cough : Occasional cough she thinks from dry air, request a refill of hydrocodone  which she takes very infrequently.  PDMP okay, Rx sent RTC 6 months          "

## 2024-06-22 ENCOUNTER — Ambulatory Visit: Admitting: Adult Health

## 2024-08-06 ENCOUNTER — Encounter: Admitting: Adult Health

## 2024-12-18 ENCOUNTER — Ambulatory Visit: Admitting: Internal Medicine
